# Patient Record
Sex: Male | Born: 1945
Health system: Southern US, Community
[De-identification: ages and names within clinical notes are randomized; demographics above are authoritative.]

## PROBLEM LIST (undated history)

## (undated) DIAGNOSIS — Z860100 Personal history of colon polyps, unspecified: Secondary | ICD-10-CM

## (undated) DIAGNOSIS — E785 Hyperlipidemia, unspecified: Secondary | ICD-10-CM

## (undated) DIAGNOSIS — E119 Type 2 diabetes mellitus without complications: Secondary | ICD-10-CM

## (undated) DIAGNOSIS — Z6372 Alcoholism and drug addiction in family: Secondary | ICD-10-CM

## (undated) DIAGNOSIS — N400 Enlarged prostate without lower urinary tract symptoms: Secondary | ICD-10-CM

## (undated) DIAGNOSIS — Z833 Family history of diabetes mellitus: Secondary | ICD-10-CM

## (undated) DIAGNOSIS — K5790 Diverticulosis of intestine, part unspecified, without perforation or abscess without bleeding: Secondary | ICD-10-CM

## (undated) HISTORY — DX: Benign prostatic hyperplasia without lower urinary tract symptoms: N40.0

## (undated) HISTORY — PX: OTHER SURGICAL HISTORY: SHX169

## (undated) HISTORY — DX: Personal history of colon polyps, unspecified: Z86.0100

## (undated) HISTORY — DX: Family history of diabetes mellitus: Z83.3

## (undated) HISTORY — PX: COLONOSCOPY W/ POLYPECTOMY: SHX1380

## (undated) HISTORY — DX: Hyperlipidemia, unspecified: E78.5

## (undated) HISTORY — DX: Personal history of colonic polyps: Z86.010

## (undated) HISTORY — DX: Diverticulosis of intestine, part unspecified, without perforation or abscess without bleeding: K57.90

## (undated) HISTORY — DX: Alcoholism and drug addiction in family: Z63.72

---

## 2005-02-02 ENCOUNTER — Ambulatory Visit: Payer: Self-pay | Admitting: Internal Medicine

## 2005-02-18 ENCOUNTER — Ambulatory Visit: Payer: Self-pay | Admitting: Internal Medicine

## 2006-12-18 ENCOUNTER — Emergency Department (HOSPITAL_COMMUNITY): Admission: EM | Admit: 2006-12-18 | Discharge: 2006-12-18 | Payer: Self-pay | Admitting: Internal Medicine

## 2008-11-16 ENCOUNTER — Ambulatory Visit: Payer: Self-pay | Admitting: Internal Medicine

## 2008-11-16 DIAGNOSIS — Z860100 Personal history of colon polyps, unspecified: Secondary | ICD-10-CM | POA: Insufficient documentation

## 2008-11-16 DIAGNOSIS — E785 Hyperlipidemia, unspecified: Secondary | ICD-10-CM

## 2008-11-16 DIAGNOSIS — N400 Enlarged prostate without lower urinary tract symptoms: Secondary | ICD-10-CM | POA: Insufficient documentation

## 2008-12-19 ENCOUNTER — Ambulatory Visit: Payer: Self-pay | Admitting: Internal Medicine

## 2009-01-01 ENCOUNTER — Encounter: Payer: Self-pay | Admitting: Internal Medicine

## 2009-01-03 ENCOUNTER — Ambulatory Visit: Payer: Self-pay

## 2009-01-03 ENCOUNTER — Ambulatory Visit: Payer: Self-pay | Admitting: Cardiology

## 2009-04-03 ENCOUNTER — Ambulatory Visit: Payer: Self-pay | Admitting: Internal Medicine

## 2009-12-26 ENCOUNTER — Encounter: Payer: Self-pay | Admitting: Internal Medicine

## 2009-12-31 ENCOUNTER — Ambulatory Visit: Payer: Self-pay | Admitting: Internal Medicine

## 2009-12-31 ENCOUNTER — Encounter: Payer: Self-pay | Admitting: Internal Medicine

## 2010-01-24 ENCOUNTER — Encounter (INDEPENDENT_AMBULATORY_CARE_PROVIDER_SITE_OTHER): Payer: Self-pay | Admitting: *Deleted

## 2010-01-27 ENCOUNTER — Ambulatory Visit: Payer: Self-pay | Admitting: Internal Medicine

## 2010-02-21 ENCOUNTER — Ambulatory Visit: Payer: Self-pay | Admitting: Internal Medicine

## 2010-02-21 LAB — HM COLONOSCOPY

## 2010-03-25 NOTE — Miscellaneous (Signed)
Summary: REC COL...AS.  Clinical Lists Changes  Medications: Added new medication of MOVIPREP 100 GM  SOLR (PEG-KCL-NACL-NASULF-NA ASC-C) As per prep instructions. - Signed Rx of MOVIPREP 100 GM  SOLR (PEG-KCL-NACL-NASULF-NA ASC-C) As per prep instructions.;  #1 x 0;  Signed;  Entered by: Clide Cliff RN;  Authorized by: Hilarie Fredrickson MD;  Method used: Electronically to [id number]*, [address], [place], [place], [place]  [address], Ph: [phone], Fax: [phone] Observations: Added new observation of ALLERGY REV: Done (01/27/2010 7:57)    Prescriptions: MOVIPREP 100 GM  SOLR (PEG-KCL-NACL-NASULF-NA ASC-C) As per prep instructions.  #1 x 0   Entered by:   Clide Cliff RN   Authorized by:   Hilarie Fredrickson MD   Signed by:   Clide Cliff RN on 01/27/2010   Method used:   Electronically to        [id number]* (retail)       [address]       [place], [place]  [address]       Ph: [phone]       Fax: [phone]   RxID:   [id number]

## 2010-03-25 NOTE — Letter (Signed)
Summary: [facility] Instructions  [facility]  [address] [place], [place] [address]   Phone: [phone]  Fax: [phone]       Nikolos LEWAN    1958/05/28    MRN: [id number]        Procedure Day Dorna Bloom:  Farrell Ours   02/21/10     Arrival Time:  10:00AM     Procedure Time:  11:00AM     Location of Procedure:                    Juliann Pares _  [facility] (4th Floor)                      PREPARATION FOR COLONOSCOPY WITH MOVIPREP   Starting 5 days prior to your procedure 02/16/10 do not eat nuts, seeds, popcorn, corn, beans, peas,  salads, or any raw vegetables.  Do not take any fiber supplements (e.g. Metamucil, Citrucel, and Benefiber).  THE DAY BEFORE YOUR PROCEDURE         DATE: 02/20/10  DAY: THURSDAY  1.  Drink clear liquids the entire day-NO SOLID FOOD  2.  Do not drink anything colored red or purple.  Avoid juices with pulp.  No orange juice.  3.  Drink at least 64 oz. (8 glasses) of fluid/clear liquids during the day to prevent dehydration and help the prep work efficiently.  CLEAR LIQUIDS INCLUDE: Water Jello Ice Popsicles Tea (sugar ok, no milk/cream) Powdered fruit flavored drinks Coffee (sugar ok, no milk/cream) Gatorade Juice: apple, white grape, white cranberry  Lemonade Clear bullion, consomm, broth Carbonated beverages (any kind) Strained chicken noodle soup Hard Candy                             4.  In the morning, mix first dose of MoviPrep solution:    Empty 1 Pouch A and 1 Pouch B into the disposable container    Add lukewarm drinking water to the top line of the container. Mix to dissolve    Refrigerate (mixed solution should be used within 24 hrs)  5.  Begin drinking the prep at 5:00 p.m. The MoviPrep container is divided by 4 marks.   Every 15 minutes drink the solution down to the next mark (approximately 8 oz) until the full liter is complete.   6.  Follow completed prep with 16 oz of clear liquid of your choice  (Nothing red or purple).  Continue to drink clear liquids until bedtime.  7.  Before going to bed, mix second dose of MoviPrep solution:    Empty 1 Pouch A and 1 Pouch B into the disposable container    Add lukewarm drinking water to the top line of the container. Mix to dissolve    Refrigerate  THE DAY OF YOUR PROCEDURE      DATE: 02/21/10  DAY: FRIDAY  Beginning at 6:00AM (5 hours before procedure):         1. Every 15 minutes, drink the solution down to the next mark (approx 8 oz) until the full liter is complete.  2. Follow completed prep with 16 oz. of clear liquid of your choice.    3. You may drink clear liquids until 9:00AM (2 HOURS BEFORE PROCEDURE).   MEDICATION INSTRUCTIONS  Unless otherwise instructed, you should take regular prescription medications with a small sip of water   as early as possible the morning of  your procedure.           OTHER INSTRUCTIONS  You will need a responsible adult at least 65 years of age to accompany you and drive you home.   This person must remain in the waiting room during your procedure.  Wear loose fitting clothing that is easily removed.  Leave jewelry and other valuables at home.  However, you may wish to bring a book to read or  an iPod/MP3 player to listen to music as you wait for your procedure to start.  Remove all body piercing jewelry and leave at home.  Total time from sign-in until discharge is approximately 2-3 hours.  You should go home directly after your procedure and rest.  You can resume normal activities the  day after your procedure.  The day of your procedure you should not:   Drive   Make legal decisions   Operate machinery   Drink alcohol   Return to work  You will receive specific instructions about eating, activities and medications before you leave.    The above instructions have been reviewed and explained to me by   Clide Cliff, RN______________________    I fully  understand and can verbalize these instructions _____________________________ Date _________

## 2010-03-25 NOTE — Letter (Signed)
Summary: Colonoscopy Letter  [facility]  [address] [place], [place] [address]   Phone: [phone]  Fax: [phone]      December 26, 2009 MRN: [id number]   Sigmond SAVANT [address] [place], [place]  [address]   Dear Mr. VI,   According to your medical record, it is time for you to schedule a Colonoscopy. The American Cancer Society recommends this procedure as a method to detect early colon cancer. Patients with a family history of colon cancer, or a personal history of colon polyps or inflammatory bowel disease are at increased risk.  This letter has been generated based on the recommendations made at the time of your procedure. If you feel that in your particular situation this may no longer apply, please contact our office.  Please call our office at [phone] to schedule this appointment or to update your records at your earliest convenience.  Thank you for cooperating with [place] to provide you with the very best care possible.   Sincerely,  Wilhemina Bonito. Marina Goodell, M.D.  [facility] [phone]

## 2010-03-25 NOTE — Assessment & Plan Note (Signed)
Summary: cpx-lb   Vital Signs:  Patient profile:   65 year old male Height:      69 inches Weight:      210.50 pounds BMI:     31.20 O2 Sat:      96 % on Room air Temp:     97.9 degrees F oral Pulse rate:   82 / minute Pulse rhythm:   regular Resp:     16 per minute BP sitting:   132 / 68  (left arm) Cuff size:   large  Vitals Entered By: Rock Nephew CMA (December 31, 2009 8:55 AM)  Nutrition Counseling: Patient's BMI is greater than 25 and therefore counseled on weight management options.  O2 Flow:  Room air CC: CPX, Preventive Care, Lipid Management Is Patient Diabetic? No Pain Assessment Patient in pain? no       Does patient need assistance? Functional Status Self care Ambulation Normal   Primary Care Provider:  Etta Grandchild MD  CC:  CPX, Preventive Care, and Lipid Management.  History of Present Illness: He returns for a physical with no complaints.  Lipid Management History:      Positive NCEP/ATP III risk factors include male age 70 years old or older.  Negative NCEP/ATP III risk factors include non-diabetic, no family history for ischemic heart disease, non-tobacco-user status, non-hypertensive, no ASHD (atherosclerotic heart disease), no prior stroke/TIA, no peripheral vascular disease, and no history of aortic aneurysm.        The patient states that he knows about the "Therapeutic Lifestyle Change" diet.  His compliance with the TLC diet is fair.  The patient expresses understanding of adjunctive measures for cholesterol lowering.  Adjunctive measures started by the patient include aerobic exercise, fiber, ASA, limit alcohol consumpton, and weight reduction.  He expresses no side effects from his lipid-lowering medication.  The patient denies any symptoms to suggest myopathy or liver disease.     Preventive Screening-Counseling & Management  Alcohol-Tobacco     Alcohol drinks/day: <1     Alcohol type: wine     >5/day in last 3 mos: no     Alcohol  Counseling: not indicated; use of alcohol is not excessive or problematic     Feels need to cut down: no     Feels annoyed by complaints: no     Feels guilty re: drinking: no     Needs 'eye opener' in am: no     Smoking Status: never     Tobacco Counseling: not indicated; no tobacco use  Hep-HIV-STD-Contraception     Hepatitis Risk: no risk noted     HIV Risk: no risk noted     STD Risk: no risk noted     Dental Visit-last 6 months yes     Dental Care Counseling: to seek dental care; no dental care within six months     TSE monthly: yes     Testicular SE Education/Counseling to perform regular STE     Sun Exposure-Excessive: no  Safety-Violence-Falls     Seat Belt Use: yes     Firearms in the Home: no firearms in the home     Smoke Detectors: no     Violence in the Home: no risk noted     Sexual Abuse: no      Sexual History:  currently monogamous.        Drug Use:  no.        Blood Transfusions:  no.    Clinical  Review Panels:  Prevention   Last Colonoscopy:  Adenomatous Polyp (11/03/2005)  Immunizations   Last Flu Vaccine:  Historical (12/04/2008)  Diabetes Management   Last Flu Vaccine:  Historical (12/04/2008)   Medications Prior to Update: 1)  Tamsulosin Hcl 0.4 Mg Caps (Tamsulosin Hcl) .... Take 1 Tablet By Mouth Once A Day 2)  Lipitor 40 Mg Tabs (Atorvastatin Calcium) .... Take 1 Tablet By Mouth Once A Day 3)  Garlic Tablets 4)  Vitamin E 600iu 5)  Natural Amino 1500 6)  B-50 Complex 7)  Abc Plus- Multivitamin 8)  Aspirin 325 Mg Tabs (Aspirin) 9)  Fish Oil 1200 Mg Caps (Omega-3 Fatty Acids) 10)  Flaxseed Oil 1200 Mg Caps (Flaxseed (Linseed)) 11)  Multi-Enzyme 12)  Borage Seed Oil  Current Medications (verified): 1)  Tamsulosin Hcl 0.4 Mg Caps (Tamsulosin Hcl) .... Take 1 Tablet By Mouth Once A Day 2)  Lipitor 40 Mg Tabs (Atorvastatin Calcium) .... Take 1 Tablet By Mouth Once A Day 3)  Garlic Tablets 4)  Vitamin E 600iu 5)  Natural Amino 1500 6)   B-50 Complex 7)  Abc Plus- Multivitamin 8)  Aspirin 325 Mg Tabs (Aspirin) 9)  Triple Omega-3-6-9  Caps (Omega 3-6-9 Fatty Acids) 10)  Flaxseed Oil 1200 Mg Caps (Flaxseed (Linseed)) 11)  Multi-Enzyme  Allergies (verified): 1)  ! * Thimerosal  Past History:  Past Medical History: Last updated: 01/01/2009 Current Problems:  FAMILY HISTORY OF ALCOHOLISM/ADDICTION (ICD-V61.41) BENIGN PROSTATIC HYPERTROPHY (ICD-600.00) HYPERLIPIDEMIA (ICD-272.4) COLONIC POLYPS, HX OF (ICD-V12.72) ROUTINE GENERAL MEDICAL EXAM@HEALTH  CARE FACL (ICD-V70.0) FAMILY HISTORY DIABETES 1ST DEGREE RELATIVE (ICD-V18.0)  Past Surgical History: Last updated: 11/16/2008 Denies surgical history  Family History: Last updated: 11/16/2008 Family History of Alcoholism/Addiction Family History Diabetes 1st degree relative  Social History: Last updated: 11/16/2008 Occupation: Technical brewer at [employer] Divorced Never Smoked Alcohol use-yes Drug use-no Regular exercise-yes  Risk Factors: Alcohol Use: <1 (12/31/2009) >5 drinks/d w/in last 3 months: no (12/31/2009) Exercise: yes (11/16/2008)  Risk Factors: Smoking Status: never (12/31/2009)  Family History: Reviewed history from 11/16/2008 and no changes required. Family History of Alcoholism/Addiction Family History Diabetes 1st degree relative  Social History: Reviewed history from 11/16/2008 and no changes required. 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  The patient denies anorexia, fever, chest pain, syncope, dyspnea on exertion, peripheral edema, prolonged cough, headaches, hemoptysis, abdominal pain, melena, hematochezia, severe indigestion/heartburn, hematuria, suspicious skin lesions, difficulty walking,  depression, enlarged lymph nodes, angioedema, and testicular masses.   CV:  Denies chest pain or discomfort, difficulty breathing at night, difficulty breathing while lying down, fainting, fatigue, leg cramps with exertion, lightheadness, palpitations, shortness of breath with exertion, and swelling of feet. GU:  Complains of urinary hesitancy; denies decreased libido, discharge, dysuria, erectile dysfunction, hematuria, incontinence, nocturia, and urinary frequency.  Physical Exam  General:  alert, well-developed, well-nourished, well-hydrated, appropriate dress, normal appearance, cooperative to examination, good hygiene, and overweight-appearing.   Head:  normocephalic, atraumatic, and no abnormalities observed.   Eyes:  vision grossly intact, pupils equal, pupils round, and pupils reactive to light.   Mouth:  good dentition, no gingival abnormalities, no dental plaque, pharynx pink and moist, no erythema, no exudates, no posterior lymphoid hypertrophy, and no lesions.   Neck:  supple, full ROM, no  masses, and no neck tenderness.   Breasts:  No masses or gynecomastia noted Lungs:  Normal respiratory effort, chest expands symmetrically. Lungs are clear to auscultation, no crackles or wheezes. Heart:  Normal rate and regular rhythm. S1 and S2 normal without gallop, murmur, click, rub or other extra sounds. Abdomen:  soft, non-tender, normal bowel sounds, no distention, no masses, no guarding, no rigidity, no rebound tenderness, no inguinal hernia, no hepatomegaly, and no splenomegaly.  he has a large ventral hernia.  Rectal:  No external abnormalities noted. Normal sphincter tone. No rectal masses or tenderness. Heme neg. stool. Genitalia:  circumcised, no hydrocele, no varicocele, no scrotal masses, no testicular masses or atrophy, no cutaneous lesions, and no urethral discharge.   Prostate:  Prostate gland firm and smooth, no enlargement, nodularity, tenderness, mass, asymmetry or  induration. Msk:  normal ROM, no joint tenderness, no joint swelling, and no joint warmth.   Pulses:  R and L carotid,radial,femoral,dorsalis pedis and posterior tibial pulses are full and equal bilaterally Extremities:  trace left pedal edema and trace right pedal edema.   Neurologic:  No cranial nerve deficits noted. Station and gait are normal. Plantar reflexes are down-going bilaterally. DTRs are symmetrical throughout. Sensory, motor and coordinative functions appear intact. Skin:  turgor normal, color normal, no rashes, no suspicious lesions, no ecchymoses, no petechiae, no purpura, no ulcerations, no edema, solar damage, and seborrheic keratosis.   Cervical Nodes:  No lymphadenopathy noted Axillary Nodes:  No palpable lymphadenopathy Inguinal Nodes:  No significant adenopathy Psych:  Cognition and judgment appear intact. Alert and cooperative with normal attention span and concentration. No apparent delusions, illusions, hallucinations   Impression & Recommendations:  Problem # 1:  ROUTINE GENERAL MEDICAL EXAM@HEALTH  CARE FACL (ICD-V70.0) Assessment Unchanged  Orders: Venipuncture ([address]) TLB-Lipid Panel (80061-LIPID) TLB-BMP (Basic Metabolic Panel-BMET) (80048-METABOL) TLB-CBC Platelet - w/Differential (85025-CBCD) TLB-Hepatic/Liver Function Pnl (80076-HEPATIC) TLB-TSH (Thyroid Stimulating Hormone) (84443-TSH) TLB-Udip w/ Micro (81001-URINE) TLB-PSA (Prostate Specific Antigen) (84153-PSA) Hemoccult Guaiac-1 spec.(in office) (82270) EKG w/ Interpretation ([address]000)  Colonoscopy: Adenomatous Polyp (11/03/2005) Flu Vax: Historical (12/04/2008)    Discussed using sunscreen, use of alcohol, drug use, self testicular exam, routine dental care, routine eye care, routine physical exam, seat belts, multiple vitamins, and recommendations for immunizations.  Discussed exercise and checking cholesterol.  Also recommend checking PSA.  Complete Medication List: 1)  Tamsulosin Hcl 0.4 Mg Caps  (Tamsulosin hcl) .... Take 1 tablet by mouth once a day 2)  Lipitor 40 Mg Tabs (Atorvastatin calcium) .... Take 1 tablet by mouth once a day 3)  Garlic Tablets  4)  Vitamin E 600iu  5)  Natural Amino 1500  6)  B-50 Complex  7)  Abc Plus- Multivitamin  8)  Aspirin 325 Mg Tabs (Aspirin) 9)  Triple Omega-3-6-9 Caps (Omega 3-6-9 fatty acids) 10)  Flaxseed Oil 1200 Mg Caps (Flaxseed (linseed)) 11)  Multi-enzyme   Lipid Assessment/Plan:      Based on NCEP/ATP III, the patient's risk factor category is "0-1 risk factors".  The patient's lipid goals are as follows: Total cholesterol goal is 200; LDL cholesterol goal is 160; HDL cholesterol goal is 40; Triglyceride goal is 150.    Colorectal Screening:  Current Recommendations:    Hemoccult: NEG X 1 today    Colonoscopy recommended: scheduled with G.I.  PSA Screening:    Reviewed PSA screening recommendations: PSA ordered  Immunization & Chemoprophylaxis:    Influenza vaccine: Historical  (12/04/2008)  Patient Instructions: 1)  Please schedule a follow-up appointment in  1 year. 2)  It is important that you exercise regularly at least 20 minutes 5 times a week. If you develop chest pain, have severe difficulty breathing, or feel very tired , stop exercising immediately and seek medical attention. 3)  You need to lose weight. Consider a lower calorie diet and regular exercise.  Prescriptions: LIPITOR 40 MG TABS (ATORVASTATIN CALCIUM) Take 1 tablet by mouth once a day  #90 x 3   Entered and Authorized by:   Etta Grandchild MD   Signed by:   Etta Grandchild MD on 12/31/2009   Method used:   Printed then faxed to ...       [id number]* (retail)       [address]       [place], [place]  [address]       Ph: [phone]       Fax: [phone]   RxID:   [id number] TAMSULOSIN HCL 0.4 MG CAPS (TAMSULOSIN HCL) Take 1 tablet by mouth once a day  #90 x 3   Entered and Authorized by:    Etta Grandchild MD   Signed by:   Etta Grandchild MD on 12/31/2009   Method used:   Printed then faxed to ...       [facility] [phone]* (retail)       [address]       [place], [place]  [address]       Ph: [phone]       Fax: [phone]   RxID:   [id number]    Orders Added: 1)  Venipuncture [93235] 2)  TLB-Lipid Panel [80061-LIPID] 3)  TLB-BMP (Basic Metabolic Panel-BMET) [80048-METABOL] 4)  TLB-CBC Platelet - w/Differential [85025-CBCD] 5)  TLB-Hepatic/Liver Function Pnl [80076-HEPATIC] 6)  TLB-TSH (Thyroid Stimulating Hormone) [84443-TSH] 7)  TLB-Udip w/ Micro [81001-URINE] 8)  TLB-PSA (Prostate Specific Antigen) [84153-PSA] 9)  Est. Patient 40-64 years [[address]396] 10)  Hemoccult Guaiac-1 spec.(in office) [82270] 11)  EKG w/ Interpretation [93000]  Appended Document: cpx[facility]lb [facility] [phone]

## 2010-03-25 NOTE — Letter (Signed)
Summary: Lipid Letter  [facility]  [address] [place], [place] [address]   Phone: [phone]  Fax: [phone]    04/03/2009  Taylor Torres [address] [place], [place]  [address]  Dear Taylor Torres:  We have carefully reviewed your last lipid profile from 01/01/09 and the results are noted below with a summary of recommendations for lipid management.    Cholesterol:       140     Goal: <200   HDL "good" Cholesterol:   37     Goal: >40   LDL "bad" Cholesterol:   78     Goal: <160   Triglycerides:       124     Goal: <150    these are good results, though the HDL could be higher    TLC Diet (Therapeutic Lifestyle Change): Saturated Fats & Transfatty acids should be kept < 7% of total calories ***Reduce Saturated Fats Polyunstaurated Fat can be up to 10% of total calories Monounsaturated Fat Fat can be up to 20% of total calories Total Fat should be no greater than 25-35% of total calories Carbohydrates should be 50-60% of total calories Protein should be approximately 15% of total calories Fiber should be at least 20-30 grams a day ***Increased fiber may help lower LDL Total Cholesterol should be < 200mg /day Consider adding plant stanol/sterols to diet (example: Benacol spread) ***A higher intake of unsaturated fat may reduce Triglycerides and Increase HDL    Adjunctive Measures (may lower LIPIDS and reduce risk of Heart Attack) include: Aerobic Exercise (20-30 minutes 3-4 times a week) Limit Alcohol Consumption Weight Reduction Aspirin 75-81 mg a day by mouth (if not allergic or contraindicated) Dietary Fiber 20-30 grams a day by mouth     Current Medications: 1)    Tamsulosin Hcl 0.4 Mg Caps (Tamsulosin hcl) .... Take 1 tablet by mouth once a day 2)    Lipitor 40 Mg Tabs (Atorvastatin calcium) .... Take 1 tablet by mouth once a day 3)    Garlic Tablets  4)    Vitamin E 600iu  5)    Natural Amino 1500  6)    B-50 Complex  7)    Abc Plus- Multivitamin   8)    Aspirin 325 Mg Tabs (Aspirin) 9)    Fish Oil 1200 Mg Caps (Omega-3 fatty acids) 10)    Flaxseed Oil 1200 Mg Caps (Flaxseed (linseed)) 11)    Multi-enzyme  12)    Borage Seed Oil   If you have any questions, please call. We appreciate being able to work with you.   Sincerely,    [facility] Etta Grandchild MD

## 2010-03-25 NOTE — Letter (Signed)
Summary: Results Follow-up Letter  [facility]  [address] [place], [place] [address]   Phone: [phone]  Fax: [phone]    04/03/2009  [address] [place], [place]  [address]  Dear Mr. PICKREL,   The following are the results of your  test(s) done 01/01/09:  Test     Result     CBC       normal Liver/kidney   normal Thyroid     normal Prostate     normal    _________________________________________________________  Please call for an appointment as directed _________________________________________________________ _________________________________________________________ _________________________________________________________  Sincerely,  Sanda Linger MD [facility]

## 2010-03-25 NOTE — Assessment & Plan Note (Signed)
Summary: 4 mos f/u #/xcd   Vital Signs:  Patient profile:   65 year old male Height:      69 inches Weight:      209 pounds BMI:     30.98 O2 Sat:      97 % on Room air Temp:     98.2 degrees F oral Pulse rate:   85 / minute Pulse rhythm:   regular Resp:     16 per minute BP sitting:   136 / 70  (left arm) Cuff size:   large  Vitals Entered By: Rock Nephew CMA (April 03, 2009 8:39 AM)  Nutrition Counseling: Patient's BMI is greater than 25 and therefore counseled on weight management options.  O2 Flow:  Room air  Primary Care Provider:  Etta Grandchild MD   History of Present Illness: He returns for f/up and it became apparent today that I did not receive a copy of his recent labs that were done at [facility]. He feels well and offers no complaints.  Lipid Management History:      Positive NCEP/ATP III risk factors include male age 1 years old or older.  Negative NCEP/ATP III risk factors include non-diabetic, no family history for ischemic heart disease, non-tobacco-user status, non-hypertensive, no ASHD (atherosclerotic heart disease), no prior stroke/TIA, no peripheral vascular disease, and no history of aortic aneurysm.        The patient states that he knows about the "Therapeutic Lifestyle Change" diet.  His compliance with the TLC diet is fair.  The patient expresses understanding of adjunctive measures for cholesterol lowering.  Adjunctive measures started by the patient include fiber, limit alcohol consumpton, and weight reduction.  He expresses no side effects from his lipid-lowering medication.  The patient denies any symptoms to suggest myopathy or liver disease.     Preventive Screening-Counseling & Management  Alcohol-Tobacco     Alcohol drinks/day: <1     Alcohol type: wine     >5/day in last 3 mos: no     Alcohol Counseling: not indicated; use of alcohol is not excessive or problematic     Feels need to cut down: no     Feels annoyed by complaints: no   Feels guilty re: drinking: no     Needs 'eye opener' in am: no     Smoking Status: never  Hep-HIV-STD-Contraception     Hepatitis Risk: no risk noted     HIV Risk: no risk noted     STD Risk: no risk noted     TSE monthly: yes      Drug Use:  no.        Blood Transfusions:  no.    Current Medications (verified): 1)  Tamsulosin Hcl 0.4 Mg Caps (Tamsulosin Hcl) .... Take 1 Tablet By Mouth Once A Day 2)  Lipitor 40 Mg Tabs (Atorvastatin Calcium) .... Take 1 Tablet By Mouth Once A Day 3)  Garlic Tablets 4)  Vitamin E 600iu 5)  Natural Amino 1500 6)  B-50 Complex 7)  Abc Plus- Multivitamin 8)  Aspirin 325 Mg Tabs (Aspirin) 9)  Fish Oil 1200 Mg Caps (Omega-3 Fatty Acids) 10)  Flaxseed Oil 1200 Mg Caps (Flaxseed (Linseed)) 11)  Multi-Enzyme 12)  Borage Seed Oil  Allergies (verified): 1)  ! * Thimerosal  Past History:  Past Medical History: Reviewed history from 01/01/2009 and no changes required. Current Problems:  FAMILY HISTORY OF ALCOHOLISM/ADDICTION (ICD-V61.41) BENIGN PROSTATIC HYPERTROPHY (ICD-600.00) HYPERLIPIDEMIA (ICD-272.4) COLONIC POLYPS, HX OF (  ICD-V12.72) ROUTINE GENERAL MEDICAL EXAM@HEALTH  CARE FACL (ICD-V70.0) FAMILY HISTORY DIABETES 1ST DEGREE RELATIVE (ICD-V18.0)  Past Surgical History: Reviewed history from 11/16/2008 and no changes required. Denies surgical history  Family History: Reviewed history from 11/16/2008 and no changes required. Family History of Alcoholism/Addiction Family History Diabetes 1st degree relative  Social History: Reviewed history from 11/16/2008 and no changes required. Occupation: Technical brewer at [employer] Never Smoked Alcohol use-yes Drug use-no Regular exercise-yes  Review of Systems       The patient complains of weight gain.  The patient denies anorexia, chest pain, syncope, dyspnea on exertion, peripheral edema, prolonged cough, headaches, hemoptysis, abdominal pain, hematuria, difficulty walking, and  depression.    Physical Exam  General:  alert, well-developed, well-nourished, well-hydrated, appropriate dress, normal appearance, cooperative to examination, good hygiene, and overweight-appearing.   Mouth:  good dentition, no gingival abnormalities, no dental plaque, pharynx pink and moist, no erythema, no exudates, no posterior lymphoid hypertrophy, and no lesions.   Neck:  supple, full ROM, no masses, and no neck tenderness.   Lungs:  Normal respiratory effort, chest expands symmetrically. Lungs are clear to auscultation, no crackles or wheezes. Heart:  Normal rate and regular rhythm. S1 and S2 normal without gallop, murmur, click, rub or other extra sounds. Abdomen:  soft, non-tender, normal bowel sounds, no distention, no masses, no guarding, no rigidity, no rebound tenderness, no inguinal hernia, no hepatomegaly, and no splenomegaly.  he has a large ventral hernia.  Msk:  normal ROM, no joint tenderness, no joint swelling, and no joint warmth.   Pulses:  R and L carotid,radial,femoral,dorsalis pedis and posterior tibial pulses are full and equal bilaterally Extremities:  trace left pedal edema and trace right pedal edema.   Neurologic:  No cranial nerve deficits noted. Station and gait are normal. Plantar reflexes are down-going bilaterally. DTRs are symmetrical throughout. Sensory, motor and coordinative functions appear intact. Skin:  turgor normal, color normal, no rashes, no suspicious lesions, no ecchymoses, no petechiae, no purpura, no ulcerations, no edema, solar damage, and seborrheic keratosis.   Psych:  Cognition and judgment appear intact. Alert and cooperative with normal attention span and concentration. No apparent delusions, illusions, hallucinations   Impression & Recommendations:  Problem # 1:  HYPERLIPIDEMIA (ICD-272.4) Assessment Unchanged  he will prompt [facility] to forward a copy of his labs to me from last October and will repeat lipids today His updated  medication list for this problem includes:    Lipitor 40 Mg Tabs (Atorvastatin calcium) .Marland Kitchen... Take 1 tablet by mouth once a day  Orders: Venipuncture (18841) TLB-Lipid Panel (80061-LIPID) TLB-BMP (Basic Metabolic Panel-BMET) (80048-METABOL) TLB-Hepatic/Liver Function Pnl (80076-HEPATIC) TLB-TSH (Thyroid Stimulating Hormone) (84443-TSH)  Lipid Goals: Chol Goal: 200 (11/16/2008)   HDL Goal: 40 (11/16/2008)   LDL Goal: 160 (11/16/2008)   TG Goal: 150 (11/16/2008)  Prior 10 Yr Risk Heart Disease: Not enough information (11/16/2008)  Complete Medication List: 1)  Tamsulosin Hcl 0.4 Mg Caps (Tamsulosin hcl) .... Take 1 tablet by mouth once a day 2)  Lipitor 40 Mg Tabs (Atorvastatin calcium) .... Take 1 tablet by mouth once a day 3)  Garlic Tablets  4)  Vitamin E 600iu  5)  Natural Amino 1500  6)  B-50 Complex  7)  Abc Plus- Multivitamin  8)  Aspirin 325 Mg Tabs (Aspirin) 9)  Fish Oil 1200 Mg Caps (Omega-3 fatty acids) 10)  Flaxseed Oil 1200 Mg Caps (Flaxseed (linseed)) 11)  Multi-enzyme  12)  Borage Seed Oil  Lipid Assessment/Plan:      Based on NCEP/ATP III, the patient's risk factor category is "0-1 risk factors".  The patient's lipid goals are as follows: Total cholesterol goal is 200; LDL cholesterol goal is 160; HDL cholesterol goal is 40; Triglyceride goal is 150.    Patient Instructions: 1)  Please schedule a follow-up appointment in 4 months. 2)  It is important that you exercise regularly at least 20 minutes 5 times a week. If you develop chest pain, have severe difficulty breathing, or feel very tired , stop exercising immediately and seek medical attention. 3)  You need to lose weight. Consider a lower calorie diet and regular exercise.

## 2010-03-27 NOTE — Procedures (Signed)
Summary: Colonoscopy  Patient: [name] Note: All result statuses are Final unless otherwise noted.  Tests: (1) Colonoscopy (COL)   COL Colonoscopy           DONE     [facility]     [address]     [place], [place]  [address]           COLONOSCOPY PROCEDURE REPORT           PATIENT:  Taylor Torres, Taylor Torres  MR#:  [id number]     BIRTHDATE:  Jul 06, 1945, 64 yrs. old  GENDER:  male     ENDOSCOPIST:  Wilhemina Bonito. Eda Keys, MD     REF. BY:  Surveillance Program Recall,     PROCEDURE DATE:  02/21/2010     PROCEDURE:  Surveillance Colonoscopy     ASA CLASS:  Class II     INDICATIONS:  history of pre-cancerous (adenomatous) colon polyps,     surveillance and high-risk screening ; index 2003 w/ TA; f/u 2006           MEDICATIONS:   Fentanyl 50 mcg IV, Versed 6 mg IV           DESCRIPTION OF PROCEDURE:   After the risks benefits and     alternatives of the procedure were thoroughly explained, informed     consent was obtained.  Digital rectal exam was performed and     revealed no abnormalities.   The LB CF-[id number] endoscope     was introduced through the anus and advanced to the cecum, which     was identified by both the appendix and ileocecal valve, without     limitations.Time to cecum = 3:00 min. The quality of the prep was     excellent, using MoviPrep.  The instrument was then slowly     withdrawn (time = 11:23 min) as the colon was fully examined.     <<PROCEDUREIMAGES>>           FINDINGS:  Severe diverticulosis was found in the left colon.     Otherwise normal colonoscopy without  polyps, masses, vascular     ectasias, or inflammatory changes.     Retroflexed views in the     rectum revealed no abnormalities.    The scope was then withdrawn     from the patient and the procedure completed.           COMPLICATIONS:  None     ENDOSCOPIC IMPRESSION:     1) Severe diverticulosis in the left colon     2) Otherwise normal colonoscopy     3) No polyps or cancers          RECOMMENDATIONS:     1) Follow up colonoscopy in 5 years           ______________________________     Wilhemina Bonito. Eda Keys, MD           CC:  Etta Grandchild, MD; The Patient           n.     eSIGNED:   Wilhemina Bonito. Eda Keys at 02/21/2010 12:26 PM           [place], [id number]  Note: An exclamation mark (!) indicates a result that was not dispersed into the flowsheet. Document Creation Date: 02/21/2010 12:26 PM _______________________________________________________________________  (1) Order result status: Final Collection or observation date-time: 02/21/2010 12:20 Requested date-time:  Receipt date-time:  Reported date-time:  Referring  Physician:   Ordering Physician: Fransico Setters [phone]) Specimen Source:  Source: Launa Grill Order Number: [id number] Lab site:   Appended Document: Colonoscopy    Clinical Lists Changes  Observations: Added new observation of COLONNXTDUE: 01/2015 (02/21/2010 12:35)

## 2010-05-13 ENCOUNTER — Telehealth: Payer: Self-pay | Admitting: Internal Medicine

## 2010-05-13 MED ORDER — HYDROCORTISONE ACETATE 25 MG RE SUPP: RECTAL | Status: DC

## 2010-05-13 NOTE — Telephone Encounter (Signed)
Patient aware of Dr. Marzetta Board recommendations. Prescription sent in for pt to [facility]. Pt instructed to call us back if no improvement.

## 2010-05-13 NOTE — Telephone Encounter (Signed)
FYI for Dr. Marina Goodell.

## 2010-05-13 NOTE — Telephone Encounter (Signed)
Spoke with pt and he states that he is having some rectal bleeding. He has seen bright red blood on the tissue paper when he wipes. Thinks he has an internal hemorrhoid. Had a colon with Dr. Marina Goodell in December. Dr. Arlyce Dice as doc of the day please advise.

## 2010-05-13 NOTE — Telephone Encounter (Signed)
Anusol hc supp 1 phs for 7 days will go back at this

## 2011-01-12 ENCOUNTER — Ambulatory Visit (INDEPENDENT_AMBULATORY_CARE_PROVIDER_SITE_OTHER): Payer: 59 | Admitting: Internal Medicine

## 2011-01-12 ENCOUNTER — Encounter: Payer: Self-pay | Admitting: Internal Medicine

## 2011-01-12 VITALS — BP 130/72 | HR 88 | Temp 98.5°F | Resp 16 | Ht 69.0 in | Wt 206.0 lb

## 2011-01-12 DIAGNOSIS — Z23 Encounter for immunization: Secondary | ICD-10-CM

## 2011-01-12 DIAGNOSIS — Z136 Encounter for screening for cardiovascular disorders: Secondary | ICD-10-CM

## 2011-01-12 DIAGNOSIS — Z0000 Encounter for general adult medical examination without abnormal findings: Secondary | ICD-10-CM

## 2011-01-12 NOTE — Progress Notes (Signed)
  Subjective:    Patient ID: Taylor Torres, male    DOB: 1945-04-15, 65 y.o.   MRN: [id number]  HPI He returns for a complete physical and he offers no complaints.    Review of Systems  Constitutional: Negative.   HENT: Negative.   Eyes: Negative.   Respiratory: Negative.   Cardiovascular: Negative.   Gastrointestinal: Negative.   Genitourinary: Negative.   Musculoskeletal: Negative.   Skin: Negative.   Neurological: Negative.   Hematological: Negative.   Psychiatric/Behavioral: Negative.        Objective:   Physical Exam  Vitals reviewed. Constitutional: He is oriented to person, place, and time. He appears well-developed and well-nourished. No distress.  HENT:  Head: Normocephalic and atraumatic.  Mouth/Throat: No oropharyngeal exudate.  Eyes: Conjunctivae are normal. Right eye exhibits no discharge. Left eye exhibits no discharge. No scleral icterus.  Neck: Normal range of motion. Neck supple. No JVD present. No tracheal deviation present. No thyromegaly present.  Cardiovascular: Normal rate, regular rhythm, normal heart sounds and intact distal pulses.  Exam reveals no gallop and no friction rub.   No murmur heard. Pulmonary/Chest: Effort normal and breath sounds normal. No stridor. No respiratory distress. He has no wheezes. He has no rales. He exhibits no tenderness.  Abdominal: Soft. Bowel sounds are normal. He exhibits no distension and no mass. There is no tenderness. There is no rebound and no guarding. Hernia confirmed negative in the right inguinal area and confirmed negative in the left inguinal area.  Genitourinary: Rectum normal, prostate normal, testes normal and penis normal. Rectal exam shows no external hemorrhoid, no internal hemorrhoid, no fissure, no mass, no tenderness and anal tone normal. Guaiac negative stool. Prostate is not enlarged and not tender. Right testis shows no mass, no swelling and no tenderness. Right testis is descended. Left testis shows no  mass, no swelling and no tenderness. Left testis is descended. Circumcised. No penile tenderness. No discharge found.  Musculoskeletal: Normal range of motion. He exhibits no edema and no tenderness.  Lymphadenopathy:    He has no cervical adenopathy.       Right: No inguinal adenopathy present.       Left: No inguinal adenopathy present.  Neurological: He is oriented to person, place, and time.  Skin: Skin is warm and dry. No rash noted. He is not diaphoretic. No erythema. No pallor.  Psychiatric: He has a normal mood and affect. His behavior is normal. Judgment and thought content normal.          Assessment & Plan:

## 2011-01-12 NOTE — Assessment & Plan Note (Addendum)
Exam done, labs ordered, vaccines were updated and pt ed material was given.  The patient is here for annual Medicare wellness examination and management of other chronic and acute problems.   The risk factors are reflected in the social history.  The roster of all physicians providing medical care to patient - is listed in the Snapshot section of the chart.  Activities of daily living:  The patient is 100% inedpendent in all ADLs: dressing, toileting, feeding as well as independent mobility  Home safety : The patient has smoke detectors in the home. They wear seatbelts.No firearms at home ( firearms are present in the home, kept in a safe fashion). There is no violence in the home.   There is no risks for hepatitis, STDs or HIV. There is no   history of blood transfusion. They have no travel history to infectious disease endemic areas of the world.  The patient has (has not) seen their dentist in the last six month. They have (not) seen their eye doctor in the last year. They deny (admit to) any hearing difficulty and have not had audiologic testing in the last year.  They do not  have excessive sun exposure. Discussed the need for sun protection: hats, long sleeves and use of sunscreen if there is significant sun exposure.   Diet: the importance of a healthy diet is discussed. They do have a healthy (unhealthy-high fat/fast food) diet.  The patient has a regular exercise program: _______ , ____duration, _____per week.  The benefits of regular aerobic exercise were discussed.  Depression screen: there are no signs or vegative symptoms of depression- irritability, change in appetite, anhedonia, sadness/tearfullness.  Cognitive assessment: the patient manages all their financial and personal affairs and is actively engaged. They could relate day,date,year and events; recalled 3/3 objects at 3 minutes; performed clock-face test normally.  The following portions of the patient's history were  reviewed and updated as appropriate: allergies, current medications, past family history, past medical history,  past surgical history, past social history  and problem list.  Vision, hearing, body mass index were assessed and reviewed.   During the course of the visit the patient was educated and counseled about appropriate screening and preventive services including : fall prevention , diabetes screening, nutrition counseling, colorectal cancer screening, and recommended immunizations.

## 2011-01-12 NOTE — Patient Instructions (Signed)
Health Maintenance, Males A healthy lifestyle and preventative care can promote health and wellness.  Maintain regular health, dental, and eye exams.   Eat a healthy diet. Foods like vegetables, fruits, whole grains, low-fat dairy products, and lean protein foods contain the nutrients you need without too many calories. Decrease your intake of foods high in solid fats, added sugars, and salt. Get information about a proper diet from your caregiver, if necessary.   Regular physical exercise is one of the most important things you can do for your health. Most adults should get at least 150 minutes of moderate-intensity exercise (any activity that increases your heart rate and causes you to sweat) each week. In addition, most adults need muscle-strengthening exercises on 2 or more days a week.    Maintain a healthy weight. The body mass index (BMI) is a screening tool to identify possible weight problems. It provides an estimate of body fat based on height and weight. Your caregiver can help determine your BMI, and can help you achieve or maintain a healthy weight. For adults 20 years and older:   A BMI below 18.5 is considered underweight.   A BMI of 18.5 to 24.9 is normal.   A BMI of 25 to 29.9 is considered overweight.   A BMI of 30 and above is considered obese.   Maintain normal blood lipids and cholesterol by exercising and minimizing your intake of saturated fat. Eat a balanced diet with plenty of fruits and vegetables. Blood tests for lipids and cholesterol should begin at age 20 and be repeated every 5 years. If your lipid or cholesterol levels are high, you are over 50, or you are a high risk for heart disease, you may need your cholesterol levels checked more frequently.Ongoing high lipid and cholesterol levels should be treated with medicines, if diet and exercise are not effective.   If you smoke, find out from your caregiver how to quit. If you do not use tobacco, do not start.    If you choose to drink alcohol, do not exceed 2 drinks per day. One drink is considered to be 12 ounces (355 mL) of beer, 5 ounces (148 mL) of wine, or 1.5 ounces (44 mL) of liquor.   Avoid use of street drugs. Do not share needles with anyone. Ask for help if you need support or instructions about stopping the use of drugs.   High blood pressure causes heart disease and increases the risk of stroke. Blood pressure should be checked at least every 1 to 2 years. Ongoing high blood pressure should be treated with medicines if weight loss and exercise are not effective.   If you are 45 to 65 years old, ask your caregiver if you should take aspirin to prevent heart disease.   Diabetes screening involves taking a blood sample to check your fasting blood sugar level. This should be done once every 3 years, after age 45, if you are within normal weight and without risk factors for diabetes. Testing should be considered at a younger age or be carried out more frequently if you are overweight and have at least 1 risk factor for diabetes.   Colorectal cancer can be detected and often prevented. Most routine colorectal cancer screening begins at the age of 50 and continues through age 75. However, your caregiver may recommend screening at an earlier age if you have risk factors for colon cancer. On a yearly basis, your caregiver may provide home test kits to check for hidden   blood in the stool. Use of a small camera at the end of a tube, to directly examine the colon (sigmoidoscopy or colonoscopy), can detect the earliest forms of colorectal cancer. Talk to your caregiver about this at age 50, when routine screening begins. Direct examination of the colon should be repeated every 5 to 10 years through age 75, unless early forms of pre-cancerous polyps or small growths are found.   Healthy men should no longer receive prostate-specific antigen (PSA) blood tests as part of routine cancer screening. Consult with  your caregiver about prostate cancer screening.   Practice safe sex. Use condoms and avoid high-risk sexual practices to reduce the spread of sexually transmitted infections (STIs).   Use sunscreen with a sun protection factor (SPF) of 30 or greater. Apply sunscreen liberally and repeatedly throughout the day. You should seek shade when your shadow is shorter than you. Protect yourself by wearing long sleeves, pants, a wide-brimmed hat, and sunglasses year round, whenever you are outdoors.   Notify your caregiver of new moles or changes in moles, especially if there is a change in shape or color. Also notify your caregiver if a mole is larger than the size of a pencil eraser.   A one-time screening for abdominal aortic aneurysm (AAA) and surgical repair of large AAAs by sound wave imaging (ultrasonography) is recommended for ages 65 to 75 years who are current or former smokers.   Stay current with your immunizations.  Document Released: 08/08/2007 Document Revised: 10/22/2010 Document Reviewed: 07/07/2010 [employer] Patient Information 2012 [employer]. 

## 2011-01-14 ENCOUNTER — Telehealth: Payer: Self-pay | Admitting: Internal Medicine

## 2011-01-14 NOTE — Telephone Encounter (Signed)
Patient called and stated that he was seen on Monday and forgot to ask for a year's Rx on his Lipitor and Flomax because he has to submit it to a new pharmacy program.  Please advise.

## 2011-01-14 NOTE — Telephone Encounter (Signed)
ok 

## 2011-01-21 ENCOUNTER — Other Ambulatory Visit: Payer: Self-pay | Admitting: *Deleted

## 2011-01-21 MED ORDER — ATORVASTATIN CALCIUM 40 MG PO TABS: 40.0000 mg | ORAL_TABLET | Freq: Every day | ORAL | Status: DC

## 2011-01-21 MED ORDER — TAMSULOSIN HCL 0.4 MG PO CAPS: 0.4000 mg | ORAL_CAPSULE | Freq: Every day | ORAL | Status: DC

## 2011-01-21 NOTE — Telephone Encounter (Signed)
Rx[s] printed, signed & placed in mail.

## 2011-01-21 NOTE — Telephone Encounter (Signed)
Mailed Rx's to patient.  Patient informed by VM.

## 2012-01-13 ENCOUNTER — Encounter: Payer: Self-pay | Admitting: Internal Medicine

## 2012-01-13 ENCOUNTER — Ambulatory Visit (INDEPENDENT_AMBULATORY_CARE_PROVIDER_SITE_OTHER): Payer: 59 | Admitting: Internal Medicine

## 2012-01-13 VITALS — BP 120/68 | HR 78 | Temp 97.5°F | Resp 16 | Wt 207.0 lb

## 2012-01-13 DIAGNOSIS — Z23 Encounter for immunization: Secondary | ICD-10-CM

## 2012-01-13 DIAGNOSIS — Z0000 Encounter for general adult medical examination without abnormal findings: Secondary | ICD-10-CM

## 2012-01-13 DIAGNOSIS — E785 Hyperlipidemia, unspecified: Secondary | ICD-10-CM

## 2012-01-13 DIAGNOSIS — N410 Acute prostatitis: Secondary | ICD-10-CM

## 2012-01-13 LAB — FECAL OCCULT BLOOD, GUAIAC: Fecal Occult Blood: NEGATIVE

## 2012-01-13 MED ORDER — ATORVASTATIN CALCIUM 40 MG PO TABS: 40.0000 mg | ORAL_TABLET | Freq: Every day | ORAL | Status: DC

## 2012-01-13 NOTE — Progress Notes (Signed)
  Subjective:    Patient ID: Taylor Torres, male    DOB: 1945/03/21, 66 y.o.   MRN: [id number]  HPI  He returns for a complete physical - he feels well and offers no complaints.  Review of Systems  Constitutional: Negative.   HENT: Negative.   Eyes: Negative.   Respiratory: Negative.   Cardiovascular: Negative.   Gastrointestinal: Negative.   Genitourinary: Negative.   Musculoskeletal: Negative.   Skin: Negative.   Neurological: Negative.   Hematological: Negative.   Psychiatric/Behavioral: Negative.        Objective:   Physical Exam  Vitals reviewed. Constitutional: He is oriented to person, place, and time. He appears well-developed and well-nourished. No distress.  HENT:  Head: Normocephalic and atraumatic.  Mouth/Throat: Oropharynx is clear and moist. No oropharyngeal exudate.  Eyes: Conjunctivae normal are normal. Right eye exhibits no discharge. Left eye exhibits no discharge. No scleral icterus.  Neck: Normal range of motion. Neck supple. No JVD present. No tracheal deviation present. No thyromegaly present.  Cardiovascular: Normal rate, regular rhythm, normal heart sounds and intact distal pulses.  Exam reveals no gallop and no friction rub.   No murmur heard. Pulmonary/Chest: Effort normal and breath sounds normal. No stridor. No respiratory distress. He has no wheezes. He has no rales. He exhibits no tenderness.  Abdominal: Soft. Bowel sounds are normal. He exhibits no distension and no mass. There is no tenderness. There is no rebound and no guarding. Hernia confirmed negative in the right inguinal area and confirmed negative in the left inguinal area.  Genitourinary: Rectum normal, prostate normal, testes normal and penis normal. Rectal exam shows no external hemorrhoid, no internal hemorrhoid, no fissure, no mass, no tenderness and anal tone normal. Guaiac negative stool. Prostate is not enlarged and not tender. Right testis shows no mass, no swelling and no  tenderness. Right testis is descended. Left testis shows no mass, no swelling and no tenderness. Left testis is descended. Circumcised. No penile erythema or penile tenderness. No discharge found.  Musculoskeletal: Normal range of motion. He exhibits no edema and no tenderness.  Lymphadenopathy:    He has no cervical adenopathy.       Right: No inguinal adenopathy present.       Left: No inguinal adenopathy present.  Neurological: He is oriented to person, place, and time.  Skin: Skin is warm and dry. No rash noted. He is not diaphoretic. No erythema. No pallor.  Psychiatric: His behavior is normal. Judgment and thought content normal.      No results found for this basename: WBC, HGB, HCT, PLT, GLUCOSE, CHOL, TRIG, HDL, LDLDIRECT, LDLCALC, ALT, AST, NA, K, CL, CREATININE, BUN, CO2, TSH, PSA, INR, GLUF, HGBA1C, MICROALBUR     Assessment & Plan:

## 2012-01-13 NOTE — Assessment & Plan Note (Signed)
He is doing well on lipitor, I will check his FLP and CMP today 

## 2012-01-13 NOTE — Assessment & Plan Note (Addendum)

## 2012-01-13 NOTE — Patient Instructions (Signed)
Health Maintenance, Males A healthy lifestyle and preventative care can promote health and wellness.  Maintain regular health, dental, and eye exams.  Eat a healthy diet. Foods like vegetables, fruits, whole grains, low-fat dairy products, and lean protein foods contain the nutrients you need without too many calories. Decrease your intake of foods high in solid fats, added sugars, and salt. Get information about a proper diet from your caregiver, if necessary.  Regular physical exercise is one of the most important things you can do for your health. Most adults should get at least 150 minutes of moderate-intensity exercise (any activity that increases your heart rate and causes you to sweat) each week. In addition, most adults need muscle-strengthening exercises on 2 or more days a week.   Maintain a healthy weight. The body mass index (BMI) is a screening tool to identify possible weight problems. It provides an estimate of body fat based on height and weight. Your caregiver can help determine your BMI, and can help you achieve or maintain a healthy weight. For adults 20 years and older:  A BMI below 18.5 is considered underweight.  A BMI of 18.5 to 24.9 is normal.  A BMI of 25 to 29.9 is considered overweight.  A BMI of 30 and above is considered obese.  Maintain normal blood lipids and cholesterol by exercising and minimizing your intake of saturated fat. Eat a balanced diet with plenty of fruits and vegetables. Blood tests for lipids and cholesterol should begin at age 20 and be repeated every 5 years. If your lipid or cholesterol levels are high, you are over 50, or you are a high risk for heart disease, you may need your cholesterol levels checked more frequently.Ongoing high lipid and cholesterol levels should be treated with medicines, if diet and exercise are not effective.  If you smoke, find out from your caregiver how to quit. If you do not use tobacco, do not start.  If you  choose to drink alcohol, do not exceed 2 drinks per day. One drink is considered to be 12 ounces (355 mL) of beer, 5 ounces (148 mL) of wine, or 1.5 ounces (44 mL) of liquor.  Avoid use of street drugs. Do not share needles with anyone. Ask for help if you need support or instructions about stopping the use of drugs.  High blood pressure causes heart disease and increases the risk of stroke. Blood pressure should be checked at least every 1 to 2 years. Ongoing high blood pressure should be treated with medicines if weight loss and exercise are not effective.  If you are 45 to 66 years old, ask your caregiver if you should take aspirin to prevent heart disease.  Diabetes screening involves taking a blood sample to check your fasting blood sugar level. This should be done once every 3 years, after age 45, if you are within normal weight and without risk factors for diabetes. Testing should be considered at a younger age or be carried out more frequently if you are overweight and have at least 1 risk factor for diabetes.  Colorectal cancer can be detected and often prevented. Most routine colorectal cancer screening begins at the age of 50 and continues through age 75. However, your caregiver may recommend screening at an earlier age if you have risk factors for colon cancer. On a yearly basis, your caregiver may provide home test kits to check for hidden blood in the stool. Use of a small camera at the end of a tube,   to directly examine the colon (sigmoidoscopy or colonoscopy), can detect the earliest forms of colorectal cancer. Talk to your caregiver about this at age 50, when routine screening begins. Direct examination of the colon should be repeated every 5 to 10 years through age 75, unless early forms of pre-cancerous polyps or small growths are found.  Hepatitis C blood testing is recommended for all people born from 1945 through 1965 and any individual with known risks for hepatitis C.  Healthy  men should no longer receive prostate-specific antigen (PSA) blood tests as part of routine cancer screening. Consult with your caregiver about prostate cancer screening.  Testicular cancer screening is not recommended for adolescents or adult males who have no symptoms. Screening includes self-exam, caregiver exam, and other screening tests. Consult with your caregiver about any symptoms you have or any concerns you have about testicular cancer.  Practice safe sex. Use condoms and avoid high-risk sexual practices to reduce the spread of sexually transmitted infections (STIs).  Use sunscreen with a sun protection factor (SPF) of 30 or greater. Apply sunscreen liberally and repeatedly throughout the day. You should seek shade when your shadow is shorter than you. Protect yourself by wearing long sleeves, pants, a wide-brimmed hat, and sunglasses year round, whenever you are outdoors.  Notify your caregiver of new moles or changes in moles, especially if there is a change in shape or color. Also notify your caregiver if a mole is larger than the size of a pencil eraser.  A one-time screening for abdominal aortic aneurysm (AAA) and surgical repair of large AAAs by sound wave imaging (ultrasonography) is recommended for ages 65 to 75 years who are current or former smokers.  Stay current with your immunizations. Document Released: 08/08/2007 Document Revised: 05/04/2011 Document Reviewed: 07/07/2010 [employer] Patient Information 2013 [employer].  

## 2012-01-14 DIAGNOSIS — N410 Acute prostatitis: Secondary | ICD-10-CM | POA: Insufficient documentation

## 2012-01-14 MED ORDER — CIPROFLOXACIN HCL 500 MG PO TABS: 500.0000 mg | ORAL_TABLET | Freq: Two times a day (BID) | ORAL | Status: DC

## 2012-01-14 NOTE — Addendum Note (Signed)
Addended by: Etta Grandchild on: 01/14/2012 11:49 AM   Modules accepted: Orders

## 2012-01-14 NOTE — Assessment & Plan Note (Signed)
Labs received from [facility] and his urine looks infected with + WBC's and + leukocyte esterase, he will start cipro

## 2012-01-18 ENCOUNTER — Encounter: Payer: Self-pay | Admitting: Internal Medicine

## 2012-01-18 LAB — CMP14+EGFR
ALT: 20 U/L (ref 10–40)
AST: 19 U/L
Albumin/Globulin Ratio: 1.7
Alkaline Phosphatase: 73 U/L
BUN/Creatinine Ratio: 12
BUN: 13 mg/dL (ref 4–21)
Bilirubin, Total: 0.5
Chloride, Serum: 100
EGFR: 81 mg/dL
Potassium, serum: 4.7
Sodium, serum: 139

## 2012-01-18 LAB — URINALYSIS
Bilirubin (Urine): NEGATIVE
Glucose, Ur: NEGATIVE
Ketones: NEGATIVE
Nitrite, UA: NEGATIVE
Occult Blood: NEGATIVE
Protein, Ur: NEGATIVE
Specific Gravity: 1.022
Urobilinogen, UA: NORMAL
pH: 5.5

## 2012-01-18 LAB — CBC WITH DIFFERENTIAL
EOS: 5 %
HCT: 44 %
Hemoglobin: 15 g/dL (ref 13.5–17.5)
Immature Grans (Abs): 0
Lymphocytes Absolute: 2 /uL
Lymphs: 28
MCHC: 34.4
Monocytes Absolute: 1 /uL
Monocytes: 9
RBC: 4.87

## 2012-01-18 LAB — LIPID PANEL
Cholesterol, Total: 133
HDL: 36 mg/dL (ref 35–70)
LDL Calculated: 74 mg/dL
PSA: 0.9
TSH: 1.32
Triglycerides: 115
VLDL: 23 mg/dL

## 2012-12-29 ENCOUNTER — Other Ambulatory Visit: Payer: Self-pay

## 2013-01-13 ENCOUNTER — Encounter: Payer: 59 | Admitting: Internal Medicine

## 2013-01-16 ENCOUNTER — Other Ambulatory Visit: Payer: Self-pay | Admitting: Internal Medicine

## 2013-01-16 ENCOUNTER — Ambulatory Visit (INDEPENDENT_AMBULATORY_CARE_PROVIDER_SITE_OTHER): Payer: 59 | Admitting: Internal Medicine

## 2013-01-16 ENCOUNTER — Encounter: Payer: Self-pay | Admitting: Internal Medicine

## 2013-01-16 VITALS — BP 144/76 | HR 104 | Temp 98.5°F | Resp 16 | Ht 69.0 in | Wt 210.0 lb

## 2013-01-16 DIAGNOSIS — Z0000 Encounter for general adult medical examination without abnormal findings: Secondary | ICD-10-CM

## 2013-01-16 DIAGNOSIS — E785 Hyperlipidemia, unspecified: Secondary | ICD-10-CM

## 2013-01-16 DIAGNOSIS — N400 Enlarged prostate without lower urinary tract symptoms: Secondary | ICD-10-CM

## 2013-01-16 MED ORDER — ATORVASTATIN CALCIUM 40 MG PO TABS: 40.0000 mg | ORAL_TABLET | Freq: Every day | ORAL | Status: DC

## 2013-01-16 NOTE — Assessment & Plan Note (Signed)
He has no s/s His exam is normal I will check his PSA today

## 2013-01-16 NOTE — Assessment & Plan Note (Signed)
He is doing well on lipitor Will check his labs today

## 2013-01-16 NOTE — Progress Notes (Signed)
  Subjective:    Patient ID: Taylor Torres, male    DOB: 08-04-1945, 67 y.o.   MRN: [id number]  HPI  He returns for a complete physical and he tells me that he feels well.  Review of Systems  Constitutional: Negative.  Negative for fever, chills, diaphoresis, activity change, appetite change, fatigue and unexpected weight change.  HENT: Negative.   Eyes: Negative.   Respiratory: Negative.  Negative for cough, chest tightness, shortness of breath, wheezing and stridor.   Cardiovascular: Negative.  Negative for chest pain, palpitations and leg swelling.  Gastrointestinal: Negative.  Negative for nausea, vomiting, abdominal pain, diarrhea, constipation and blood in stool.  Endocrine: Negative.   Genitourinary: Negative.  Negative for dysuria, urgency, frequency, hematuria, flank pain, decreased urine volume, enuresis, difficulty urinating and testicular pain.  Musculoskeletal: Negative.   Skin: Negative.   Allergic/Immunologic: Negative.   Neurological: Negative.   Hematological: Negative.  Negative for adenopathy. Does not bruise/bleed easily.  Psychiatric/Behavioral: Negative.        Objective:   Physical Exam  Vitals reviewed. Constitutional: He is oriented to person, place, and time. He appears well-developed and well-nourished. No distress.  HENT:  Head: Normocephalic and atraumatic.  Mouth/Throat: Oropharynx is clear and moist. No oropharyngeal exudate.  Eyes: Conjunctivae are normal. Right eye exhibits no discharge. Left eye exhibits no discharge. No scleral icterus.  Neck: Normal range of motion. Neck supple. No JVD present. No tracheal deviation present. No thyromegaly present.  Cardiovascular: Normal rate, regular rhythm, normal heart sounds and intact distal pulses.  Exam reveals no gallop and no friction rub.   No murmur heard. Pulmonary/Chest: Effort normal and breath sounds normal. No stridor. No respiratory distress. He has no wheezes. He has no rales. He exhibits no  tenderness.  Abdominal: Soft. Bowel sounds are normal. He exhibits no distension and no mass. There is no tenderness. There is no rebound and no guarding. Hernia confirmed negative in the right inguinal area and confirmed negative in the left inguinal area.  Genitourinary: Rectum normal, prostate normal, testes normal and penis normal. Rectal exam shows no external hemorrhoid, no internal hemorrhoid, no fissure, no mass, no tenderness and anal tone normal. Guaiac negative stool. Prostate is not enlarged and not tender. Right testis shows no mass, no swelling and no tenderness. Right testis is descended. Left testis shows no mass, no swelling and no tenderness. Left testis is descended. Circumcised. No penile erythema or penile tenderness. No discharge found.  Musculoskeletal: Normal range of motion. He exhibits no edema and no tenderness.  Lymphadenopathy:    He has no cervical adenopathy.       Right: No inguinal adenopathy present.       Left: No inguinal adenopathy present.  Neurological: He is oriented to person, place, and time.  Skin: Skin is warm and dry. No rash noted. He is not diaphoretic. No erythema. No pallor.  Psychiatric: He has a normal mood and affect. His behavior is normal. Judgment and thought content normal.     Lab Results  Component Value Date   WBC 7.2 01/13/2012   WBC 2+ 01/13/2012   HGB 15.0 01/13/2012   HCT 44 01/13/2012   TRIG 115 01/13/2012   HDL 36 01/13/2012   LDLCALC 74 01/13/2012   ALT 20 01/18/2012   AST 19 01/18/2012   CREATININE 1.09 01/18/2012   BUN 13 01/18/2012   TSH 1.320 01/13/2012   PSA 0.9 01/13/2012       Assessment & Plan:

## 2013-01-16 NOTE — Assessment & Plan Note (Addendum)

## 2013-01-16 NOTE — Patient Instructions (Signed)
Health Maintenance, Males A healthy lifestyle and preventative care can promote health and wellness.  Maintain regular health, dental, and eye exams.  Eat a healthy diet. Foods like vegetables, fruits, whole grains, low-fat dairy products, and lean protein foods contain the nutrients you need without too many calories. Decrease your intake of foods high in solid fats, added sugars, and salt. Get information about a proper diet from your caregiver, if necessary.  Regular physical exercise is one of the most important things you can do for your health. Most adults should get at least 150 minutes of moderate-intensity exercise (any activity that increases your heart rate and causes you to sweat) each week. In addition, most adults need muscle-strengthening exercises on 2 or more days a week.   Maintain a healthy weight. The body mass index (BMI) is a screening tool to identify possible weight problems. It provides an estimate of body fat based on height and weight. Your caregiver can help determine your BMI, and can help you achieve or maintain a healthy weight. For adults 20 years and older:  A BMI below 18.5 is considered underweight.  A BMI of 18.5 to 24.9 is normal.  A BMI of 25 to 29.9 is considered overweight.  A BMI of 30 and above is considered obese.  Maintain normal blood lipids and cholesterol by exercising and minimizing your intake of saturated fat. Eat a balanced diet with plenty of fruits and vegetables. Blood tests for lipids and cholesterol should begin at age 20 and be repeated every 5 years. If your lipid or cholesterol levels are high, you are over 50, or you are a high risk for heart disease, you may need your cholesterol levels checked more frequently.Ongoing high lipid and cholesterol levels should be treated with medicines, if diet and exercise are not effective.  If you smoke, find out from your caregiver how to quit. If you do not use tobacco, do not start.  Lung  cancer screening is recommended for adults aged 55 80 years who are at high risk for developing lung cancer because of a history of smoking. Yearly low-dose computed tomography (CT) is recommended for people who have at least a 30-pack-year history of smoking and are a current smoker or have quit within the past 15 years. A pack year of smoking is smoking an average of 1 pack of cigarettes a day for 1 year (for example: 1 pack a day for 30 years or 2 packs a day for 15 years). Yearly screening should continue until the smoker has stopped smoking for at least 15 years. Yearly screening should also be stopped for people who develop a health problem that would prevent them from having lung cancer treatment.  If you choose to drink alcohol, do not exceed 2 drinks per day. One drink is considered to be 12 ounces (355 mL) of beer, 5 ounces (148 mL) of wine, or 1.5 ounces (44 mL) of liquor.  Avoid use of street drugs. Do not share needles with anyone. Ask for help if you need support or instructions about stopping the use of drugs.  High blood pressure causes heart disease and increases the risk of stroke. Blood pressure should be checked at least every 1 to 2 years. Ongoing high blood pressure should be treated with medicines if weight loss and exercise are not effective.  If you are 45 to 67 years old, ask your caregiver if you should take aspirin to prevent heart disease.  Diabetes screening involves taking a blood   sample to check your fasting blood sugar level. This should be done once every 3 years, after age 45, if you are within normal weight and without risk factors for diabetes. Testing should be considered at a younger age or be carried out more frequently if you are overweight and have at least 1 risk factor for diabetes.  Colorectal cancer can be detected and often prevented. Most routine colorectal cancer screening begins at the age of 50 and continues through age 75. However, your caregiver may  recommend screening at an earlier age if you have risk factors for colon cancer. On a yearly basis, your caregiver may provide home test kits to check for hidden blood in the stool. Use of a small camera at the end of a tube, to directly examine the colon (sigmoidoscopy or colonoscopy), can detect the earliest forms of colorectal cancer. Talk to your caregiver about this at age 50, when routine screening begins. Direct examination of the colon should be repeated every 5 to 10 years through age 75, unless early forms of pre-cancerous polyps or small growths are found.  Hepatitis C blood testing is recommended for all people born from 1945 through 1965 and any individual with known risks for hepatitis C.  Healthy men should no longer receive prostate-specific antigen (PSA) blood tests as part of routine cancer screening. Consult with your caregiver about prostate cancer screening.  Testicular cancer screening is not recommended for adolescents or adult males who have no symptoms. Screening includes self-exam, caregiver exam, and other screening tests. Consult with your caregiver about any symptoms you have or any concerns you have about testicular cancer.  Practice safe sex. Use condoms and avoid high-risk sexual practices to reduce the spread of sexually transmitted infections (STIs).  Use sunscreen. Apply sunscreen liberally and repeatedly throughout the day. You should seek shade when your shadow is shorter than you. Protect yourself by wearing long sleeves, pants, a wide-brimmed hat, and sunglasses year round, whenever you are outdoors.  Notify your caregiver of new moles or changes in moles, especially if there is a change in shape or color. Also notify your caregiver if a mole is larger than the size of a pencil eraser.  A one-time screening for abdominal aortic aneurysm (AAA) and surgical repair of large AAAs by sound wave imaging (ultrasonography) is recommended for ages 65 to 75 years who are  current or former smokers.  Stay current with your immunizations. Document Released: 08/08/2007 Document Revised: 06/06/2012 Document Reviewed: 07/07/2010 [employer] Patient Information 2014 [employer].  

## 2013-01-17 LAB — CBC AND DIFFERENTIAL
Hemoglobin: 14.6 g/dL (ref 13.5–17.5)
WBC: 5.6 10^3/mL

## 2013-01-17 LAB — BASIC METABOLIC PANEL
BUN: 20 mg/dL (ref 4–21)
Glucose: 114 mg/dL
Potassium: 4.2 mmol/L (ref 3.4–5.3)
Sodium: 143 mmol/L (ref 137–147)

## 2013-01-17 LAB — HEPATIC FUNCTION PANEL
ALT: 36 U/L (ref 10–40)
AST: 22 U/L (ref 14–40)
Alkaline Phosphatase: 67 U/L (ref 25–125)
Bilirubin, Total: 0.5 mg/dL

## 2013-01-17 LAB — LIPID PANEL
HDL: 32 mg/dL — AB (ref 35–70)
Triglycerides: 144 mg/dL (ref 40–160)

## 2013-01-17 LAB — TSH: TSH: 1.53 u[IU]/mL (ref 0.41–5.90)

## 2013-01-23 ENCOUNTER — Encounter: Payer: Self-pay | Admitting: Internal Medicine

## 2013-01-25 ENCOUNTER — Encounter: Payer: Self-pay | Admitting: Internal Medicine

## 2013-02-02 ENCOUNTER — Encounter: Payer: Self-pay | Admitting: Internal Medicine

## 2014-01-17 ENCOUNTER — Encounter: Payer: 59 | Admitting: Internal Medicine

## 2014-02-01 ENCOUNTER — Encounter: Payer: 59 | Admitting: Internal Medicine

## 2014-02-05 ENCOUNTER — Encounter: Payer: Self-pay | Admitting: Internal Medicine

## 2014-02-05 ENCOUNTER — Other Ambulatory Visit: Payer: Self-pay | Admitting: Internal Medicine

## 2014-02-05 ENCOUNTER — Ambulatory Visit (INDEPENDENT_AMBULATORY_CARE_PROVIDER_SITE_OTHER): Payer: 59 | Admitting: Internal Medicine

## 2014-02-05 VITALS — BP 126/64 | HR 82 | Temp 98.7°F | Resp 16 | Ht 69.0 in | Wt 206.0 lb

## 2014-02-05 DIAGNOSIS — R1314 Dysphagia, pharyngoesophageal phase: Secondary | ICD-10-CM

## 2014-02-05 DIAGNOSIS — Z0000 Encounter for general adult medical examination without abnormal findings: Secondary | ICD-10-CM

## 2014-02-05 DIAGNOSIS — E785 Hyperlipidemia, unspecified: Secondary | ICD-10-CM

## 2014-02-05 LAB — FECAL OCCULT BLOOD, GUAIAC: Fecal Occult Blood: NEGATIVE

## 2014-02-05 NOTE — Assessment & Plan Note (Signed)
He does not want to be treated for GERD I am concerned that there may be some UGI pathology so will refer to GI

## 2014-02-05 NOTE — Progress Notes (Signed)
Pre visit review using our clinic review tool, if applicable. No additional management support is needed unless otherwise documented below in the visit note. 

## 2014-02-05 NOTE — Patient Instructions (Signed)

## 2014-02-05 NOTE — Assessment & Plan Note (Signed)
He is doing well on the statin

## 2014-02-05 NOTE — Assessment & Plan Note (Signed)

## 2014-02-05 NOTE — Progress Notes (Signed)
Subjective:    Patient ID: Taylor Torres, male    DOB: Oct 17, 1945, 68 y.o.   MRN: [id number]  Gastrophageal Reflux He complains of choking and dysphagia. He reports no abdominal pain, no belching, no chest pain, no coughing, no early satiety, no globus sensation, no heartburn, no hoarse voice, no nausea, no sore throat, no stridor, no tooth decay, no water brash or no wheezing. This is a chronic problem. The problem occurs rarely. The problem has been unchanged. Nothing aggravates the symptoms. Pertinent negatives include no anemia, fatigue, melena, muscle weakness, orthopnea or weight loss. Risk factors include obesity. He has tried nothing for the symptoms. The treatment provided no relief.      Review of Systems  Constitutional: Negative.  Negative for fever, chills, weight loss, diaphoresis, appetite change and fatigue.  HENT: Negative.  Negative for hoarse voice and sore throat.   Eyes: Negative.   Respiratory: Positive for choking. Negative for apnea, cough, chest tightness, shortness of breath, wheezing and stridor.   Cardiovascular: Negative.  Negative for chest pain, palpitations and leg swelling.  Gastrointestinal: Positive for dysphagia. Negative for heartburn, nausea, abdominal pain, diarrhea, constipation, blood in stool and melena.  Endocrine: Negative.   Genitourinary: Negative.   Musculoskeletal: Negative.  Negative for myalgias, back pain, arthralgias and muscle weakness.  Skin: Negative.   Allergic/Immunologic: Negative.   Neurological: Negative.  Negative for dizziness.  Hematological: Negative.  Negative for adenopathy. Does not bruise/bleed easily.  Psychiatric/Behavioral: Negative.        Objective:   Physical Exam  Constitutional: He is oriented to person, place, and time. He appears well-developed and well-nourished. No distress.  HENT:  Head: Normocephalic and atraumatic.  Mouth/Throat: Oropharynx is clear and moist. No oropharyngeal exudate.  Eyes:  Conjunctivae are normal. Right eye exhibits no discharge. Left eye exhibits no discharge. No scleral icterus.  Neck: Normal range of motion. Neck supple. No JVD present. No tracheal deviation present. No thyromegaly present.  Cardiovascular: Normal rate, regular rhythm, normal heart sounds and intact distal pulses.  Exam reveals no gallop and no friction rub.   No murmur heard. Pulmonary/Chest: Effort normal and breath sounds normal. No stridor. No respiratory distress. He has no wheezes. He has no rales. He exhibits no tenderness.  Abdominal: Soft. Bowel sounds are normal. He exhibits no distension and no mass. There is no tenderness. There is no rebound and no guarding. Hernia confirmed negative in the right inguinal area and confirmed negative in the left inguinal area.  Genitourinary: Testes normal and penis normal. Right testis shows no mass and no swelling. Right testis is descended. Left testis shows no mass, no swelling and no tenderness. Left testis is descended. Circumcised. No penile erythema or penile tenderness. No discharge found.  Musculoskeletal: Normal range of motion. He exhibits no edema or tenderness.  Lymphadenopathy:    He has no cervical adenopathy.       Right: No inguinal adenopathy present.       Left: No inguinal adenopathy present.  Neurological: He is oriented to person, place, and time.  Skin: Skin is warm and dry. No rash noted. He is not diaphoretic. No erythema. No pallor.  Psychiatric: He has a normal mood and affect. His behavior is normal. Judgment and thought content normal.  Vitals reviewed.    Lab Results  Component Value Date   WBC 5.6 01/17/2013   HGB 14.6 01/17/2013   HCT 43 01/17/2013   PLT 182 01/17/2013   CHOL 123 01/17/2013  TRIG 144 01/17/2013   HDL 32* 01/17/2013   LDLCALC 62 01/17/2013   ALT 36 01/17/2013   AST 22 01/17/2013   NA 143 01/17/2013   K 4.2 01/17/2013   CREATININE 1.0 01/17/2013   BUN 20 01/17/2013   TSH 1.53 01/17/2013     PSA 0.5 01/23/2013       Assessment & Plan:

## 2014-02-19 ENCOUNTER — Encounter: Payer: Self-pay | Admitting: Internal Medicine

## 2014-03-09 DIAGNOSIS — L570 Actinic keratosis: Secondary | ICD-10-CM | POA: Diagnosis not present

## 2014-03-09 DIAGNOSIS — D485 Neoplasm of uncertain behavior of skin: Secondary | ICD-10-CM | POA: Diagnosis not present

## 2014-03-09 DIAGNOSIS — L821 Other seborrheic keratosis: Secondary | ICD-10-CM | POA: Diagnosis not present

## 2014-03-09 DIAGNOSIS — L820 Inflamed seborrheic keratosis: Secondary | ICD-10-CM | POA: Diagnosis not present

## 2014-03-09 DIAGNOSIS — D225 Melanocytic nevi of trunk: Secondary | ICD-10-CM | POA: Diagnosis not present

## 2014-03-29 ENCOUNTER — Ambulatory Visit (INDEPENDENT_AMBULATORY_CARE_PROVIDER_SITE_OTHER): Payer: Medicare Other | Admitting: Internal Medicine

## 2014-03-29 ENCOUNTER — Encounter: Payer: Self-pay | Admitting: Internal Medicine

## 2014-03-29 VITALS — BP 150/70 | HR 84 | Ht 68.25 in | Wt 207.5 lb

## 2014-03-29 DIAGNOSIS — R1310 Dysphagia, unspecified: Secondary | ICD-10-CM | POA: Diagnosis not present

## 2014-03-29 NOTE — Patient Instructions (Signed)

## 2014-03-29 NOTE — Progress Notes (Signed)
HISTORY OF PRESENT ILLNESS:  Taylor Torres is a 69 y.o. male with the below listed medical history who was referred by his primary care provider, Dr. Ronnald Ramp, for evaluation of new onset dysphagia. Patient has a history of adenomatous colon polyps and was last seen December 2011 when he underwent routine surveillance colonoscopy. Severe left-sided diverticulosis was noted but no polyps. Follow-up in 5 years recommended. His current history is that of 12-18 months of dysphagia. He describes food lodging in the throat area approximate once per week. Item such as dry bread or potatoes chips are most problematic. No apparent difficulties with liquids. He does describe occasionally certain foods illicit cough. He rarely has problems with heartburn that occur with dietary indiscretion, particularly late at night. His weight has been stable. There is a strong history of esophageal cancer in both brothers. They were smokers. The patient does not smoke. The patient's weight has been stable. GI review of systems is otherwise negative.  REVIEW OF SYSTEMS:  All non-GI ROS negative upon complete and extensive review of all systems  Past Medical History  Diagnosis Date  . Hyperlipidemia   . BPH (benign prostatic hypertrophy)   . History of colonic polyps   . Family history of diabetes mellitus   . Alcoholism in family   . Diverticulosis     Past Surgical History  Procedure Laterality Date  . None      Social History Taylor Torres  reports that he has never smoked. He has never used smokeless tobacco. He reports that he drinks about 3.0 oz of alcohol per week. He reports that he does not use illicit drugs.  family history includes Alcohol abuse in his other; Colon polyps in his father; Diabetes in his other; Esophageal cancer in his brother and brother. There is no history of Heart disease, Hyperlipidemia, Hypertension, Kidney disease, Stroke, Early death, Colon cancer, or Gallbladder disease.  No Active  Allergies     PHYSICAL EXAMINATION: Vital signs: BP 150/70 mmHg  Pulse 84  Ht 5' 8.25" (1.734 m)  Wt 207 lb 8 oz (94.121 kg)  BMI 31.30 kg/m2 General: Well-developed, well-nourished, no acute distress HEENT: Sclerae are anicteric, conjunctiva pink. Oral mucosa intact Lungs: Clear Heart: Regular Abdomen: soft, nontender, nondistended, no obvious ascites, no peritoneal signs, normal bowel sounds. No organomegaly. Extremities: No edema Psychiatric: alert and oriented x3. Cooperative     ASSESSMENT:  #1. Intermittent dysphasia solids as described. Rule out stricture #2. Strong family history of esophageal cancer in 2 brothers #3. Personal history of adenomatous colon polyps. Last colonoscopy 02/21/2010   PLAN:  #1. Upper endoscopy with possible esophageal dilation.The nature of the procedure, as well as the risks, benefits, and alternatives were carefully and thoroughly reviewed with the patient. Ample time for discussion and questions allowed. The patient understood, was satisfied, and agreed to proceed. #2. Surveillance colonoscopy due in about 11 months. Patient aware

## 2014-04-03 ENCOUNTER — Encounter: Payer: Medicare Other | Admitting: Internal Medicine

## 2014-04-05 ENCOUNTER — Encounter: Payer: Self-pay | Admitting: Internal Medicine

## 2014-04-05 ENCOUNTER — Ambulatory Visit (AMBULATORY_SURGERY_CENTER): Payer: Medicare Other | Admitting: Internal Medicine

## 2014-04-05 VITALS — BP 126/76 | HR 78 | Temp 97.8°F | Resp 22 | Ht 68.0 in | Wt 207.0 lb

## 2014-04-05 DIAGNOSIS — R1310 Dysphagia, unspecified: Secondary | ICD-10-CM | POA: Diagnosis not present

## 2014-04-05 MED ORDER — SODIUM CHLORIDE 0.9 % IV SOLN: 500.0000 mL | INTRAVENOUS | Status: DC

## 2014-04-05 NOTE — Progress Notes (Signed)
No problems noted in the recovery room. maw 

## 2014-04-05 NOTE — Patient Instructions (Signed)
YOU HAD AN ENDOSCOPIC PROCEDURE TODAY AT THE [facility]: Refer to the procedure report that was given to you for any specific questions about what was found during the examination.  If the procedure report does not answer your questions, please call your gastroenterologist to clarify.  If you requested that your care partner not be given the details of your procedure findings, then the procedure report has been included in a sealed envelope for you to review at your convenience later.  YOU SHOULD EXPECT: Some feelings of bloating in the abdomen. Passage of more gas than usual.  Walking can help get rid of the air that was put into your GI tract during the procedure and reduce the bloating. If you had a lower endoscopy (such as a colonoscopy or flexible sigmoidoscopy) you may notice spotting of blood in your stool or on the toilet paper. If you underwent a bowel prep for your procedure, then you may not have a normal bowel movement for a few days.  DIET: Your first meal following the procedure should be a light meal and then it is ok to progress to your normal diet.  A half-sandwich or bowl of soup is an example of a good first meal.  Heavy or fried foods are harder to digest and may make you feel nauseous or bloated.  Likewise meals heavy in dairy and vegetables can cause extra gas to form and this can also increase the bloating.  Drink plenty of fluids but you should avoid alcoholic beverages for 24 hours.  ACTIVITY: Your care partner should take you home directly after the procedure.  You should plan to take it easy, moving slowly for the rest of the day.  You can resume normal activity the day after the procedure however you should NOT DRIVE or use heavy machinery for 24 hours (because of the sedation medicines used during the test).    SYMPTOMS TO REPORT IMMEDIATELY: A gastroenterologist can be reached at any hour.  During normal business hours, 8:30 AM to 5:00 PM Monday through Friday,  call [phone].  After hours and on weekends, please call the GI answering service at [phone] who will take a message and have the physician on call contact you.   Following upper endoscopy (EGD)  Vomiting of blood or coffee ground material  New chest pain or pain under the shoulder blades  Painful or persistently difficult swallowing  New shortness of breath  Fever of 100F or higher  Black, tarry-looking stools  FOLLOW UP: If any biopsies were taken you will be contacted by phone or by letter within the next 1-3 weeks.  Call your gastroenterologist if you have not heard about the biopsies in 3 weeks.  Our staff will call the home number listed on your records the next business day following your procedure to check on you and address any questions or concerns that you may have at that time regarding the information given to you following your procedure. This is a courtesy call and so if there is no answer at the home number and we have not heard from you through the emergency physician on call, we will assume that you have returned to your regular daily activities without incident.  SIGNATURES/CONFIDENTIALITY: You and/or your care partner have signed paperwork which will be entered into your electronic medical record.  These signatures attest to the fact that that the information above on your After Visit Summary has been reviewed and is understood.  Full responsibility   of the confidentiality of this discharge information lies with you and/or your care-partner.     You might notice some irritation in your nose or drainage.  This may cause feelings of congestion.  This is from the oxygen, which can be drying.  This is no cause for concern; this should clear up in a few days.  You may resume your current medications today. Please call if any questions or concerns.

## 2014-04-05 NOTE — Op Note (Signed)
[place]  [address] [place], [address]   ENDOSCOPY PROCEDURE REPORT  PATIENT: Taylor Torres, Taylor Torres  MR#: [id number] BIRTHDATE: 14-Aug-1945 , 68  yrs. old GENDER: male ENDOSCOPIST: Eustace Quail, MD REFERRED BY:  .  Self / Office PROCEDURE DATE:  04/05/2014 PROCEDURE:  EGD, diagnostic ASA CLASS:     Class II INDICATIONS:  dysphagia (mild intermittent solid food x 18 month); TWO brothers with esophageal carcinoma. MEDICATIONS: Monitored anesthesia care and Propofol 150 mg IV TOPICAL ANESTHETIC: none  DESCRIPTION OF PROCEDURE: After the risks benefits and alternatives of the procedure were thoroughly explained, informed consent was obtained.  The LB [id number] endoscope was introduced through the mouth and advanced to the second portion of the duodenum , Without limitations.  The instrument was slowly withdrawn as the mucosa was fully examined.      EXAM: The esophagus and gastroesophageal junction were completely normal in appearance.  The stomach was entered and closely examined.The antrum, angularis, and lesser curvature were well visualized, including a retroflexed view of the cardia and fundus. The stomach wall was normally distensable.  The scope passed easily through the pylorus into the duodenum.  Retroflexed views revealed no abnormalities.     The scope was then withdrawn from the patient and the procedure completed.  COMPLICATIONS: There were no immediate complications.  ENDOSCOPIC IMPRESSION: Normal EGD  RECOMMENDATIONS: Follow-up prn  REPEAT EXAM:  eSigned:  Eustace Quail, MD 04/05/2014 2:11 PM    CC:The Patient and Janith Lima, MD

## 2014-04-06 ENCOUNTER — Telehealth: Payer: Self-pay

## 2014-04-06 NOTE — Telephone Encounter (Signed)
  Follow up Call-  Call back number 04/05/2014  Post procedure Call Back phone  # [phone]  Permission to leave phone message Yes     Patient questions:  Do you have a fever, pain , or abdominal swelling? No. Pain Score  0 *  Have you tolerated food without any problems? Yes.    Have you been able to return to your normal activities? Yes.    Do you have any questions about your discharge instructions: Diet   No. Medications  No. Follow up visit  No.  Do you have questions or concerns about your Care? No.  Actions: * If pain score is 4 or above: No action needed, pain <4.

## 2014-04-11 ENCOUNTER — Encounter: Payer: Self-pay | Admitting: Internal Medicine

## 2014-10-23 ENCOUNTER — Encounter: Payer: Self-pay | Admitting: Internal Medicine

## 2014-11-27 ENCOUNTER — Telehealth: Payer: Self-pay | Admitting: Internal Medicine

## 2014-11-27 ENCOUNTER — Inpatient Hospital Stay (HOSPITAL_COMMUNITY)
Admission: EM | Admit: 2014-11-27 | Discharge: 2014-11-30 | DRG: 378 | Disposition: A | Discharge status: Home / Self Care | Payer: Medicare Other | Attending: Internal Medicine | Admitting: Internal Medicine

## 2014-11-27 ENCOUNTER — Other Ambulatory Visit: Payer: Self-pay | Admitting: *Deleted

## 2014-11-27 ENCOUNTER — Telehealth: Payer: Self-pay | Admitting: *Deleted

## 2014-11-27 ENCOUNTER — Encounter (HOSPITAL_COMMUNITY): Payer: Self-pay | Admitting: Emergency Medicine

## 2014-11-27 DIAGNOSIS — Z79899 Other long term (current) drug therapy: Secondary | ICD-10-CM

## 2014-11-27 DIAGNOSIS — K219 Gastro-esophageal reflux disease without esophagitis: Secondary | ICD-10-CM | POA: Insufficient documentation

## 2014-11-27 DIAGNOSIS — K625 Hemorrhage of anus and rectum: Secondary | ICD-10-CM | POA: Diagnosis not present

## 2014-11-27 DIAGNOSIS — E785 Hyperlipidemia, unspecified: Secondary | ICD-10-CM | POA: Diagnosis not present

## 2014-11-27 DIAGNOSIS — D62 Acute posthemorrhagic anemia: Secondary | ICD-10-CM | POA: Diagnosis present

## 2014-11-27 DIAGNOSIS — K5791 Diverticulosis of intestine, part unspecified, without perforation or abscess with bleeding: Secondary | ICD-10-CM | POA: Diagnosis not present

## 2014-11-27 DIAGNOSIS — Z7982 Long term (current) use of aspirin: Secondary | ICD-10-CM | POA: Diagnosis not present

## 2014-11-27 DIAGNOSIS — Z87438 Personal history of other diseases of male genital organs: Secondary | ICD-10-CM | POA: Diagnosis not present

## 2014-11-27 DIAGNOSIS — Z66 Do not resuscitate: Secondary | ICD-10-CM | POA: Diagnosis not present

## 2014-11-27 DIAGNOSIS — N400 Enlarged prostate without lower urinary tract symptoms: Secondary | ICD-10-CM | POA: Diagnosis not present

## 2014-11-27 DIAGNOSIS — R739 Hyperglycemia, unspecified: Secondary | ICD-10-CM | POA: Diagnosis present

## 2014-11-27 DIAGNOSIS — K5731 Diverticulosis of large intestine without perforation or abscess with bleeding: Secondary | ICD-10-CM | POA: Diagnosis not present

## 2014-11-27 LAB — CBC
HCT: 40.8 % (ref 39.0–52.0)
Hemoglobin: 13.9 g/dL (ref 13.0–17.0)
MCH: 31.1 pg (ref 26.0–34.0)
MCHC: 34.1 g/dL (ref 30.0–36.0)
MCV: 91.3 fL (ref 78.0–100.0)
PLATELETS: 200 10*3/uL (ref 150–400)
RBC: 4.47 MIL/uL (ref 4.22–5.81)
RDW: 12.9 % (ref 11.5–15.5)
WBC: 12.4 10*3/uL — AB (ref 4.0–10.5)

## 2014-11-27 LAB — COMPREHENSIVE METABOLIC PANEL
ALBUMIN: 4.5 g/dL (ref 3.5–5.0)
ALK PHOS: 59 U/L (ref 38–126)
ALT: 37 U/L (ref 17–63)
AST: 31 U/L (ref 15–41)
Anion gap: 7 (ref 5–15)
BILIRUBIN TOTAL: 0.5 mg/dL (ref 0.3–1.2)
BUN: 20 mg/dL (ref 6–20)
CALCIUM: 9.6 mg/dL (ref 8.9–10.3)
CO2: 27 mmol/L (ref 22–32)
CREATININE: 1.1 mg/dL (ref 0.61–1.24)
Chloride: 105 mmol/L (ref 101–111)
GFR calc Af Amer: >60 mL/min (ref >60)
GLUCOSE: 191 mg/dL — AB (ref 65–99)
POTASSIUM: 4.3 mmol/L (ref 3.5–5.1)
Sodium: 139 mmol/L (ref 135–145)
TOTAL PROTEIN: 7.6 g/dL (ref 6.5–8.1)

## 2014-11-27 MED ORDER — SODIUM CHLORIDE 0.9 % IV BOLUS (SEPSIS)
1000.0000 mL | Freq: Once | INTRAVENOUS | Status: AC
  Administered 2014-11-27: 1000 mL via INTRAVENOUS

## 2014-11-27 MED ORDER — HYDROCORTISONE ACETATE 25 MG RE SUPP: RECTAL | Status: DC

## 2014-11-27 MED ORDER — ONDANSETRON HCL 4 MG/2ML IJ SOLN: 4.0000 mg | Freq: Three times a day (TID) | INTRAMUSCULAR | Status: DC | PRN

## 2014-11-27 NOTE — Telephone Encounter (Signed)
Receive call pt states he is having episode of bleeding hemorrhoids. MD rx some suppository before wanting to get a refill sent to [facility]. Inform pt will send electronically...Taylor Torres

## 2014-11-27 NOTE — ED Notes (Signed)
Per pt, states he has a history of hemorrhoids-blood in stool today X 4

## 2014-11-27 NOTE — ED Provider Notes (Signed)
CSN: [id number]     Arrival date & time 11/27/14  1737 History   First MD Initiated Contact with Patient 11/27/14 2050     Chief Complaint  Patient presents with  . Hemorrhoids     (Consider location/radiation/quality/duration/timing/severity/associated sxs/prior Treatment) Patient is a 69 y.o. male presenting with hematochezia. The history is provided by the patient. No language interpreter was used.  Rectal Bleeding Quality:  Bright red Amount:  Moderate Duration:  1 day Timing:  Constant Progression:  Worsening Chronicity:  New Similar prior episodes: no   Relieved by:  Nothing Worsened by:  Nothing tried Ineffective treatments:  None tried Associated symptoms: light-headedness   Associated symptoms: no fever and no vomiting   Risk factors: no anticoagulant use     Past Medical History  Diagnosis Date  . Hyperlipidemia   . BPH (benign prostatic hypertrophy)   . History of colonic polyps   . Family history of diabetes mellitus   . Alcoholism in family   . Diverticulosis    Past Surgical History  Procedure Laterality Date  . None     Family History  Problem Relation Age of Onset  . Alcohol abuse Other   . Diabetes Other   . Heart disease Neg Hx   . Hyperlipidemia Neg Hx   . Hypertension Neg Hx   . Kidney disease Neg Hx   . Stroke Neg Hx   . Early death Neg Hx   . Esophageal cancer Brother     Dead  . Esophageal cancer Brother     alive  . Colon cancer Neg Hx   . Colon polyps Father   . Gallbladder disease Neg Hx    Social History  Substance Use Topics  . Smoking status: Never Smoker   . Smokeless tobacco: Never Used     Comment: Regular exercise - Yes  . Alcohol Use: 3.0 oz/week    5 Glasses of wine per week    Review of Systems  Constitutional: Negative for fever.  Gastrointestinal: Positive for blood in stool and hematochezia. Negative for vomiting.  Neurological: Positive for light-headedness.  All other systems reviewed and are  negative.     Allergies  Review of patient's allergies indicates no active allergies.  Home Medications   Prior to Admission medications   Medication Sig Start Date End Date Taking? Authorizing Provider  Amino Acids (AMINO ACID PO) Take by mouth.    Historical Provider, MD  aspirin 325 MG tablet Take 325 mg by mouth daily.      Historical Provider, MD  atorvastatin (LIPITOR) 40 MG tablet Take 1 tablet (40 mg total) by mouth daily. 01/16/13   Janith Lima, MD  B Complex-Biotin-FA (B-COMPLEX PO) Take 1 tablet by mouth daily.      Historical Provider, MD  Coenzyme Q10 200 MG capsule Take 200 mg by mouth daily.    Historical Provider, MD  Digestive Enzymes (MULTI-ENZYME PO) Take 1 tablet by mouth daily.      Historical Provider, MD  hydrocortisone (ANUSOL-HC) 25 MG suppository Use 1 per rectum qhs times 7 days 11/27/14   Janith Lima, MD  MAGNESIUM-ZINC PO Take 1 tablet by mouth daily.    Historical Provider, MD  Multiple Vitamins-Minerals (MULTIVITAMIN WITH MINERALS) tablet Take 1 tablet by mouth daily.      Historical Provider, MD  Omega 3-6-9 Fatty Acids (TRIPLE OMEGA-3-6-9) CAPS Take by mouth.      Historical Provider, MD   BP 134/64 mmHg  Pulse 98  Temp(Src) 97.9 F (36.6 C) (Oral)  Resp 16  SpO2 100% Physical Exam  Constitutional: He is oriented to person, place, and time. He appears well-developed and well-nourished.  HENT:  Head: Normocephalic and atraumatic.  Right Ear: External ear normal.  Left Ear: External ear normal.  Nose: Nose normal.  Mouth/Throat: Oropharynx is clear and moist.  Eyes: Conjunctivae and EOM are normal. Pupils are equal, round, and reactive to light.  Neck: Normal range of motion.  Cardiovascular: Normal rate and normal heart sounds.   Pulmonary/Chest: Effort normal.  Abdominal: He exhibits no distension.  Musculoskeletal: Normal range of motion.  Neurological: He is alert and oriented to person, place, and time.  Skin: Skin is warm.   Psychiatric: He has a normal mood and affect.  Nursing note and vitals reviewed.   ED Course  Procedures (including critical care time) Labs Review Labs Reviewed  COMPREHENSIVE METABOLIC PANEL - Abnormal; Notable for the following:    Glucose, Bld 191 (*)    All other components within normal limits  CBC - Abnormal; Notable for the following:    WBC 12.4 (*)    All other components within normal limits  POC OCCULT BLOOD, ED    Imaging Review No results found. I have personally reviewed and evaluated these images and lab results as part of my medical decision-making.   EKG Interpretation None      MDM   Final diagnoses:  Rectal bleeding    I spoke with Dr. Delford Field who will admit for further evaluation    Fransico Meadow, PA-C 11/27/14 [place], MD 11/28/14 [phone]

## 2014-11-27 NOTE — ED Notes (Signed)
Pt states he has had bright red rectal bleeding since last night.  Pt states he has had four episodes since onset.  Pt states he had episode of feeling lightheaded today but denies this currently.  Pt states he has had loose stool x several days.

## 2014-11-28 ENCOUNTER — Telehealth: Payer: Self-pay

## 2014-11-28 ENCOUNTER — Encounter (HOSPITAL_COMMUNITY): Payer: Self-pay | Admitting: Internal Medicine

## 2014-11-28 DIAGNOSIS — D62 Acute posthemorrhagic anemia: Secondary | ICD-10-CM

## 2014-11-28 DIAGNOSIS — K5731 Diverticulosis of large intestine without perforation or abscess with bleeding: Secondary | ICD-10-CM

## 2014-11-28 LAB — CBC
HEMATOCRIT: 32.3 % — AB (ref 39.0–52.0)
HEMATOCRIT: 31.8 % — AB (ref 39.0–52.0)
HEMATOCRIT: 33.2 % — AB (ref 39.0–52.0)
HEMATOCRIT: 30.7 % — AB (ref 39.0–52.0)
HEMOGLOBIN: 10.7 g/dL — AB (ref 13.0–17.0)
HEMOGLOBIN: 10.3 g/dL — AB (ref 13.0–17.0)
Hemoglobin: 11.2 g/dL — ABNORMAL LOW (ref 13.0–17.0)
Hemoglobin: 11.3 g/dL — ABNORMAL LOW (ref 13.0–17.0)
MCH: 31.8 pg (ref 26.0–34.0)
MCH: 30.7 pg (ref 26.0–34.0)
MCH: 31.3 pg (ref 26.0–34.0)
MCH: 30.5 pg (ref 26.0–34.0)
MCHC: 34.7 g/dL (ref 30.0–36.0)
MCHC: 33.6 g/dL (ref 30.0–36.0)
MCHC: 34 g/dL (ref 30.0–36.0)
MCHC: 33.6 g/dL (ref 30.0–36.0)
MCV: 91.8 fL (ref 78.0–100.0)
MCV: 91.4 fL (ref 78.0–100.0)
MCV: 92 fL (ref 78.0–100.0)
MCV: 90.8 fL (ref 78.0–100.0)
Platelets: 164 10*3/uL (ref 150–400)
Platelets: 167 10*3/uL (ref 150–400)
Platelets: 174 10*3/uL (ref 150–400)
Platelets: 158 10*3/uL (ref 150–400)
RBC: 3.52 MIL/uL — AB (ref 4.22–5.81)
RBC: 3.48 MIL/uL — AB (ref 4.22–5.81)
RBC: 3.61 MIL/uL — AB (ref 4.22–5.81)
RBC: 3.38 MIL/uL — ABNORMAL LOW (ref 4.22–5.81)
RDW: 13.1 % (ref 11.5–15.5)
RDW: 13.1 % (ref 11.5–15.5)
RDW: 12.9 % (ref 11.5–15.5)
RDW: 13 % (ref 11.5–15.5)
WBC: 8.1 10*3/uL (ref 4.0–10.5)
WBC: 7.7 10*3/uL (ref 4.0–10.5)
WBC: 8.1 10*3/uL (ref 4.0–10.5)
WBC: 7.3 10*3/uL (ref 4.0–10.5)

## 2014-11-28 LAB — URINALYSIS, ROUTINE W REFLEX MICROSCOPIC
Bilirubin Urine: NEGATIVE
GLUCOSE, UA: NEGATIVE mg/dL
HGB URINE DIPSTICK: NEGATIVE
Ketones, ur: NEGATIVE mg/dL
Leukocytes, UA: NEGATIVE
Nitrite: NEGATIVE
PH: 6 (ref 5.0–8.0)
Protein, ur: NEGATIVE mg/dL
SPECIFIC GRAVITY, URINE: 1.02 (ref 1.005–1.030)
Urobilinogen, UA: 0.2 mg/dL (ref 0.0–1.0)

## 2014-11-28 LAB — GLUCOSE, CAPILLARY
GLUCOSE-CAPILLARY: 132 mg/dL — AB (ref 65–99)
Glucose-Capillary: 106 mg/dL — ABNORMAL HIGH (ref 65–99)

## 2014-11-28 LAB — COMPREHENSIVE METABOLIC PANEL
ALK PHOS: 47 U/L (ref 38–126)
ALT: 30 U/L (ref 17–63)
ANION GAP: 7 (ref 5–15)
AST: 22 U/L (ref 15–41)
Albumin: 3.7 g/dL (ref 3.5–5.0)
BILIRUBIN TOTAL: 0.9 mg/dL (ref 0.3–1.2)
BUN: 16 mg/dL (ref 6–20)
CALCIUM: 8.3 mg/dL — AB (ref 8.9–10.3)
CO2: 25 mmol/L (ref 22–32)
Chloride: 107 mmol/L (ref 101–111)
Creatinine, Ser: 0.91 mg/dL (ref 0.61–1.24)
GFR calc non Af Amer: >60 mL/min (ref >60)
Glucose, Bld: 149 mg/dL — ABNORMAL HIGH (ref 65–99)
Potassium: 3.7 mmol/L (ref 3.5–5.1)
Sodium: 139 mmol/L (ref 135–145)
TOTAL PROTEIN: 5.9 g/dL — AB (ref 6.5–8.1)

## 2014-11-28 LAB — TYPE AND SCREEN
ABO/RH(D): O POS
Antibody Screen: NEGATIVE

## 2014-11-28 LAB — MRSA PCR SCREENING: MRSA BY PCR: NEGATIVE

## 2014-11-28 LAB — ABO/RH: ABO/RH(D): O POS

## 2014-11-28 MED ORDER — ACETAMINOPHEN 650 MG RE SUPP: 650.0000 mg | Freq: Four times a day (QID) | RECTAL | Status: DC | PRN

## 2014-11-28 MED ORDER — ONDANSETRON HCL 4 MG PO TABS
4.0000 mg | ORAL_TABLET | Freq: Four times a day (QID) | ORAL | Status: DC | PRN
Start: 2014-11-28 — End: 2014-11-30

## 2014-11-28 MED ORDER — ONDANSETRON HCL 4 MG/2ML IJ SOLN: 4.0000 mg | Freq: Four times a day (QID) | INTRAMUSCULAR | Status: DC | PRN

## 2014-11-28 MED ORDER — DIPHENHYDRAMINE HCL 25 MG PO CAPS: 25.0000 mg | ORAL_CAPSULE | Freq: Every evening | ORAL | Status: DC | PRN

## 2014-11-28 MED ORDER — ZOLPIDEM TARTRATE 5 MG PO TABS
5.0000 mg | ORAL_TABLET | Freq: Every evening | ORAL | Status: DC | PRN
  Administered 2014-11-28 – 2014-11-29 (×2): 5 mg via ORAL
  Filled 2014-11-28 (×2): qty 1

## 2014-11-28 MED ORDER — ACETAMINOPHEN 325 MG PO TABS
650.0000 mg | ORAL_TABLET | Freq: Four times a day (QID) | ORAL | Status: DC | PRN
  Administered 2014-11-28: 650 mg via ORAL
  Filled 2014-11-28: qty 2

## 2014-11-28 MED ORDER — ACETAMINOPHEN 325 MG PO TABS
650.0000 mg | ORAL_TABLET | ORAL | Status: DC | PRN
  Administered 2014-11-29 (×2): 650 mg via ORAL
  Filled 2014-11-28 (×2): qty 2

## 2014-11-28 MED ORDER — HYDROCORTISONE 2.5 % RE CREA: 1.0000 "application " | TOPICAL_CREAM | Freq: Two times a day (BID) | RECTAL | Status: DC

## 2014-11-28 MED ORDER — SODIUM CHLORIDE 0.9 % IV SOLN
INTRAVENOUS | Status: DC
  Administered 2014-11-28 (×3): via INTRAVENOUS

## 2014-11-28 MED ORDER — ACETAMINOPHEN 650 MG RE SUPP: 650.0000 mg | RECTAL | Status: DC | PRN

## 2014-11-28 MED ORDER — PANTOPRAZOLE SODIUM 40 MG IV SOLR
40.0000 mg | Freq: Two times a day (BID) | INTRAVENOUS | Status: DC
  Administered 2014-11-28 (×3): 40 mg via INTRAVENOUS
  Filled 2014-11-28 (×3): qty 40

## 2014-11-28 NOTE — Telephone Encounter (Signed)
Left message for pt to call back  °

## 2014-11-28 NOTE — Progress Notes (Signed)
Patient had blood in his bowel movement @ 1625.  The bowel movement was formed and soft. Medium amount of blood mixed with the stool. Patient has no complaints of dizziness. Patient returned to bed. Will continue to monitor patient.

## 2014-11-28 NOTE — Telephone Encounter (Signed)
Sent in generic anusol cream

## 2014-11-28 NOTE — H&P (Signed)
[facility] History and Physical  CAYDE HELD DGU:[id number] DOB: 1945-09-26 DOA: 11/27/2014  Referring physician: Ms. Threasa Alpha. PCP: Scarlette Calico, MD  Specialists: Dr. Henrene Pastor. Gastroenterologist.  Chief Complaint: Rectal bleeding.  HPI: Taylor Torres is a 69 y.o. male with history of hyperlipidemia presents to the ER because of rectal bleeding. Patient had at least 3 episodes of rectal bleeding at home prior to arrival. Patient's first episode started around midnight yesterday. Patient has benign frank rectal bleeding. Had some mild dizziness but denies any syncope chest pain or shortness of breath. In the ER patient had 2 more episodes of frank rectal bleeding. Denies any abdominal pain nausea vomiting. Patient has been admitted for further management of rectal bleeding. In February of this year patient had EGD which was unremarkable. Patient had colonoscopy in 2011 which showed severe diverticulosis. Patient is also on aspirin for prophylaxis. Denies any NSAID use.   Review of Systems: As presented in the history of presenting illness, rest negative.  Past Medical History  Diagnosis Date  . Hyperlipidemia   . BPH (benign prostatic hypertrophy)   . History of colonic polyps   . Family history of diabetes mellitus   . Alcoholism in family   . Diverticulosis    Past Surgical History  Procedure Laterality Date  . None     Social History:  reports that he has never smoked. He has never used smokeless tobacco. He reports that he drinks about 3.0 oz of alcohol per week. He reports that he does not use illicit drugs. Where does patient live home. Can patient participate in ADLs? Yes.  No Known Allergies  Family History:  Family History  Problem Relation Age of Onset  . Alcohol abuse Other   . Diabetes Other   . Heart disease Neg Hx   . Hyperlipidemia Neg Hx   . Hypertension Neg Hx   . Kidney disease Neg Hx   . Stroke Neg Hx   . Early death Neg Hx   . Esophageal cancer  Brother     Dead  . Esophageal cancer Brother     alive  . Colon cancer Neg Hx   . Colon polyps Father   . Gallbladder disease Neg Hx       Prior to Admission medications   Medication Sig Start Date End Date Taking? Authorizing Provider  Amino Acids (AMINO ACID PO) Take 1 capsule by mouth daily.    Yes Historical Provider, MD  aspirin 325 MG tablet Take 325 mg by mouth daily.     Yes Historical Provider, MD  atorvastatin (LIPITOR) 40 MG tablet Take 1 tablet (40 mg total) by mouth daily. 01/16/13  Yes Janith Lima, MD  B Complex-Biotin-FA (B-COMPLEX PO) Take 1 tablet by mouth daily.     Yes Historical Provider, MD  CINNAMON PO Take 1 capsule by mouth daily.   Yes Historical Provider, MD  Digestive Enzymes (MULTI-ENZYME PO) Take 1 tablet by mouth daily.     Yes Historical Provider, MD  MAGNESIUM-ZINC PO Take 1 tablet by mouth daily.   Yes Historical Provider, MD  Multiple Vitamins-Minerals (MULTIVITAMIN WITH MINERALS) tablet Take 1 tablet by mouth daily.     Yes Historical Provider, MD  Omega 3-6-9 Fatty Acids (TRIPLE OMEGA-3-6-9) CAPS Take 1 capsule by mouth daily.    Yes Historical Provider, MD  hydrocortisone (ANUSOL-HC) 25 MG suppository Use 1 per rectum qhs times 7 days Patient taking differently: Place 25 mg rectally at bedtime as needed for hemorrhoids.  Use 1 per rectum qhs times 7 days 11/27/14   Janith Lima, MD    Physical Exam: Filed Vitals:   11/27/14 1759 11/27/14 2208  BP: 134/64 128/62  Pulse: 98 90  Temp: 97.9 F (36.6 C)   TempSrc: Oral   Resp: 16 16  SpO2: 100% 93%     General:  Moderately built and nourished.  Eyes: Anicteric no pallor.  ENT: No discharge from the ears eyes nose and mouth.  Neck: No mass felt.  Cardiovascular: S1 and S2 heard.  Respiratory: No rhonchi or crepitations.  Abdomen: Soft nontender bowel sounds present.  Skin: No rash.  Musculoskeletal: No edema.  Psychiatric: Appears normal.  Neurologic: Alert awake oriented to  time place and person. Moves all extremities.  Labs on Admission:  Basic Metabolic Panel:  Recent Labs Lab 11/27/14 1814  NA 139  K 4.3  CL 105  CO2 27  GLUCOSE 191*  BUN 20  CREATININE 1.10  CALCIUM 9.6   Liver Function Tests:  Recent Labs Lab 11/27/14 1814  AST 31  ALT 37  ALKPHOS 59  BILITOT 0.5  PROT 7.6  ALBUMIN 4.5   No results for input(s): LIPASE, AMYLASE in the last 168 hours. No results for input(s): AMMONIA in the last 168 hours. CBC:  Recent Labs Lab 11/27/14 1814  WBC 12.4*  HGB 13.9  HCT 40.8  MCV 91.3  PLT 200   Cardiac Enzymes: No results for input(s): CKTOTAL, CKMB, CKMBINDEX, TROPONINI in the last 168 hours.  BNP (last 3 results) No results for input(s): BNP in the last 8760 hours.  ProBNP (last 3 results) No results for input(s): PROBNP in the last 8760 hours.  CBG: No results for input(s): GLUCAP in the last 168 hours.  Radiological Exams on Admission: No results found.   Assessment/Plan Principal Problem:   Rectal bleeding Active Problems:   Hyperlipidemia with target LDL less than 130   1. Rectal bleeding - most likely diverticular in origin. Discontinue aspirin. Continue with hydration. Closely follow CBC. Patient has been kept nothing by mouth. Consult patient's gastroenterologist Dr. Henrene Pastor in a.m. Since colonoscopy in 2011 showed severe diverticulosis. If patient continues to have bleeding through the night or becomes hypotensive we'll consult GI earlier. Type and screen. Protonix IV. 2. Hyperglycemia - check hemoglobin A1c. 3. Hyperlipidemia - continue home medicines once patient can take orally.  I have reviewed patient's old charts and labs.   DVT Prophylaxis SCDs.  Code Status: DO NOT RESUSCITATE.  Family Communication: Patient's wife at the bedside.  Disposition Plan: Admit to inpatient.    KAKRAKANDY,ARSHAD N. [facility] Pager [phone].  If 7PM-7AM, please contact  night-coverage [email or web address] Password [email or web address] 11/28/2014, 12:48 AM

## 2014-11-28 NOTE — Care Management Note (Signed)
Case Management Note  Patient Details  Name: Taylor Torres MRN: [id number] Date of Birth: Dec 28, 1945  Subjective/Objective:  69 y/o m admitted w/Rectal Bleed. From home.                  Action/Plan:d/c plan home.   Expected Discharge Date:   (uncertain)               Expected Discharge Plan:  Home/Self Care  In-House Referral:     Discharge planning Services  CM Consult  Post Acute Care Choice:    Choice offered to:     DME Arranged:    DME Agency:     HH Arranged:    HH Agency:     Status of Service:  In process, will continue to follow  Medicare Important Message Given:    Date Medicare IM Given:    Medicare IM give by:    Date Additional Medicare IM Given:    Additional Medicare Important Message give by:     If discussed at [place] of Stay Meetings, dates discussed:    Additional Comments:  Dessa Phi, RN 11/28/2014, 4:56 PM

## 2014-11-28 NOTE — Progress Notes (Signed)
Patient seen and examined. Admitted after midnight secondary to BRBPR. Patient reports slight dizziness, but denies CP, SOB, abd pain, nausea, vomiting, fever and chills. Patient has had approx 4-5 episodes of bright red blood per rectum in the last 36 hours; 2 out of these episodes happened while in ED. Patient is hemodynamically stable and his Hgb is 11.9. Please referred to H&P written by Dr. Hal Hope for further info/details on admission.  Plan: -full liquid diet -follow Hgb trend -GI consultation, will follow rec's  Barton Dubois [phone]

## 2014-11-28 NOTE — Consult Note (Signed)
Referring Provider:  Dr. Dyann Kief Primary Care Physician:  Scarlette Calico, MD Primary Gastroenterologist:  Dr. Henrene Pastor  Reason for Consultation:  GI bleed  HPI: Taylor Torres is a 69 y.o. male with past medical history of hyperlipidemia who presented to the ER at [facility] hospital last evening because of rectal bleeding. Patient had at least 3 episodes of rectal bleeding at home prior to arrival.  With one episode he felt like he was going to pass out, which prompted his ER visit.  Blood described as red, no black stools. In the ER patient had 2 more episodes of frank rectal bleeding. Denies any abdominal pain, nausea, vomiting, or any other complaints.  In February of this year patient had EGD which was unremarkable. Patient had colonoscopy in 01/2010 which showed severe diverticulosis. Patient is also on aspirin 325 mg daily for prophylaxis. Denies any NSAID use.   He is hemodynamically stable.  Hgb on year ago was 14.6 grams, on presentation was 13.9 grams, this AM was 10.7 grams, and recent recheck was 11.3 grams.  Says that bleeding seems to have slowed.  Passed a small amount of bright red blood around 6 am and none since that time.   Past Medical History  Diagnosis Date  . Hyperlipidemia   . BPH (benign prostatic hypertrophy)   . History of colonic polyps   . Family history of diabetes mellitus   . Alcoholism in family   . Diverticulosis     Past Surgical History  Procedure Laterality Date  . None      Prior to Admission medications   Medication Sig Start Date End Date Taking? Authorizing Provider  Amino Acids (AMINO ACID PO) Take 1 capsule by mouth daily.    Yes Historical Provider, MD  aspirin 325 MG tablet Take 325 mg by mouth daily.     Yes Historical Provider, MD  atorvastatin (LIPITOR) 40 MG tablet Take 1 tablet (40 mg total) by mouth daily. 01/16/13  Yes Janith Lima, MD  B Complex-Biotin-FA (B-COMPLEX PO) Take 1 tablet by mouth daily.     Yes Historical Provider, MD    CINNAMON PO Take 1 capsule by mouth daily.   Yes Historical Provider, MD  Digestive Enzymes (MULTI-ENZYME PO) Take 1 tablet by mouth daily.     Yes Historical Provider, MD  MAGNESIUM-ZINC PO Take 1 tablet by mouth daily.   Yes Historical Provider, MD  Multiple Vitamins-Minerals (MULTIVITAMIN WITH MINERALS) tablet Take 1 tablet by mouth daily.     Yes Historical Provider, MD  Omega 3-6-9 Fatty Acids (TRIPLE OMEGA-3-6-9) CAPS Take 1 capsule by mouth daily.    Yes Historical Provider, MD  hydrocortisone (ANUSOL-HC) 25 MG suppository Use 1 per rectum qhs times 7 days Patient taking differently: Place 25 mg rectally at bedtime as needed for hemorrhoids. Use 1 per rectum qhs times 7 days 11/27/14   Janith Lima, MD    Current Facility-Administered Medications  Medication Dose Route Frequency Provider Last Rate Last Dose  . 0.9 %  sodium chloride infusion   Intravenous Continuous Rise Patience, MD 125 mL/hr at 11/28/14 [phone]    . acetaminophen (TYLENOL) tablet 650 mg  650 mg Oral Q6H PRN Rise Patience, MD       Or  . acetaminophen (TYLENOL) suppository 650 mg  650 mg Rectal Q6H PRN Rise Patience, MD      . ondansetron [facility]) tablet 4 mg  4 mg Oral Q6H PRN Rise Patience, MD  Or  . ondansetron (ZOFRAN) injection 4 mg  4 mg Intravenous Q6H PRN Rise Patience, MD      . pantoprazole (PROTONIX) injection 40 mg  40 mg Intravenous Q12H Rise Patience, MD   40 mg at 11/28/14 1012    Allergies as of 11/27/2014  . (No Known Allergies)    Family History  Problem Relation Age of Onset  . Alcohol abuse Other   . Diabetes Other   . Heart disease Neg Hx   . Hyperlipidemia Neg Hx   . Hypertension Neg Hx   . Kidney disease Neg Hx   . Stroke Neg Hx   . Early death Neg Hx   . Esophageal cancer Brother     Dead  . Esophageal cancer Brother     alive  . Colon cancer Neg Hx   . Colon polyps Father   . Gallbladder disease Neg Hx     Social History   Social  History  . Marital Status: Divorced    Spouse Name: N/A  . Number of Children: 2  . Years of Education: N/A   Occupational History  . Retired    Social History Main Topics  . Smoking status: Never Smoker   . Smokeless tobacco: Never Used     Comment: Regular exercise - Yes  . Alcohol Use: 3.0 oz/week    5 Glasses of wine per week  . Drug Use: No  . Sexual Activity: Not Currently   Other Topics Concern  . Not on file   Social History Narrative    Review of Systems: Ten point ROS is O/W negative except as mentioned in HPI.  Physical Exam: Vital signs in last 24 hours: Temp:  [97.9 F (36.6 C)-98.6 F (37 C)] 98.6 F (37 C) (10/05 0800) Pulse Rate:  [85-102] 85 (10/05 0900) Resp:  [11-29] 11 (10/05 0900) BP: (116-162)/(49-77) 126/63 mmHg (10/05 0900) SpO2:  [93 %-100 %] 96 % (10/05 0900) Weight:  [205 lb 4 oz (93.1 kg)] 205 lb 4 oz (93.1 kg) (10/05 0255) Last BM Date: 11/27/14 General:  Alert, well-developed, well-nourished, pleasant and cooperative in NAD Head:  Normocephalic and atraumatic. Eyes:  Sclera clear, no icterus.  Conjunctiva pink. Ears:  Normal auditory acuity. Mouth:  No deformity or lesions.   Lungs:  Clear throughout to auscultation.  No wheezes, crackles, or rhonchi.  Heart:  Regular rate and rhythm; no murmurs, clicks, rubs, or gallops. Abdomen:  Soft, non-distended.  BS present.  Non-tender.  Rectal:  Deferred  Msk:  Symmetrical without gross deformities. Pulses:  Normal pulses noted. Extremities:  Without clubbing or edema. Neurologic:  Alert and  oriented x4;  grossly normal neurologically. Skin:  Intact without significant lesions or rashes. Psych:  Alert and cooperative. Normal mood and affect.  Intake/Output from previous day: 10/04 0701 - 10/05 0700 In: 445.8 [I.V.:445.8] Out: 300 [Urine:300]   Lab Results:  Recent Labs  11/27/14 1814 11/28/14 0304 11/28/14 0729  WBC 12.4* 8.1 7.7  HGB 13.9 11.2* 10.7*  HCT 40.8 32.3* 31.8*    PLT 200 164 167   BMET  Recent Labs  11/27/14 1814 11/28/14 0345  NA 139 139  K 4.3 3.7  CL 105 107  CO2 27 25  GLUCOSE 191* 149*  BUN 20 16  CREATININE 1.10 0.91  CALCIUM 9.6 8.3*   LFT  Recent Labs  11/28/14 0345  PROT 5.9*  ALBUMIN 3.7  AST 22  ALT 30  ALKPHOS 47  BILITOT 0.9  IMPRESSION:  -GI bleed:  Sounds like lower source.  Most likely diverticular.  Already seems to be resolving at this point. -Acute blood loss anemia:  Hgb down 2 grams from time of presentation.  11.3 grams most recent result.  PLAN: -Continue to monitor/observe at least until tomorrow evening as bleeding recurrence is the highest within the first 48-72 hours. -Monitor Hgb and transfuse prn. -Nuc med bleeding scan if re-bleeds briskly.   ZEHR, JESSICA D.  11/28/2014, 10:20 AM  Pager number [phone]      Attending physician's note   I have taken a history, examined the patient and reviewed the chart. I agree with the Advanced Practitioner's note, impression and recommendations. Acute GI bleed with LGI source suspected, probably diverticular. ABL anemia. Hold ASA. Liquid diet, trend Hb, rest and observe for rebleeding. Nuc med scan if brisk rebleeding occurs. Will reserve repeat colonoscopy for significant recurrent bleeding.   Lucio Edward, MD Marval Regal [phone] Mon-Fri 8a-5p [phone] after 5p, weekends, holidays

## 2014-11-28 NOTE — Telephone Encounter (Signed)
PA initiated for [facility]. DENIED. Ins will not cover. Alternative?

## 2014-11-29 DIAGNOSIS — K219 Gastro-esophageal reflux disease without esophagitis: Secondary | ICD-10-CM

## 2014-11-29 DIAGNOSIS — E785 Hyperlipidemia, unspecified: Secondary | ICD-10-CM

## 2014-11-29 DIAGNOSIS — K625 Hemorrhage of anus and rectum: Secondary | ICD-10-CM

## 2014-11-29 LAB — GLUCOSE, CAPILLARY
GLUCOSE-CAPILLARY: 207 mg/dL — AB (ref 65–99)
GLUCOSE-CAPILLARY: 134 mg/dL — AB (ref 65–99)
Glucose-Capillary: 115 mg/dL — ABNORMAL HIGH (ref 65–99)
Glucose-Capillary: 202 mg/dL — ABNORMAL HIGH (ref 65–99)

## 2014-11-29 LAB — HEMOGLOBIN A1C
HEMOGLOBIN A1C: 6.9 % — AB (ref 4.8–5.6)
MEAN PLASMA GLUCOSE: 151 mg/dL

## 2014-11-29 MED ORDER — ATORVASTATIN CALCIUM 40 MG PO TABS
40.0000 mg | ORAL_TABLET | Freq: Every day | ORAL | Status: DC
  Administered 2014-11-29: 40 mg via ORAL
  Filled 2014-11-29: qty 1

## 2014-11-29 MED ORDER — PANTOPRAZOLE SODIUM 40 MG PO TBEC
40.0000 mg | DELAYED_RELEASE_TABLET | Freq: Every day | ORAL | Status: DC
  Administered 2014-11-29: 40 mg via ORAL
  Filled 2014-11-29: qty 1

## 2014-11-29 MED ORDER — TAB-A-VITE/IRON PO TABS
1.0000 | ORAL_TABLET | Freq: Every day | ORAL | Status: DC
  Administered 2014-11-29 – 2014-11-30 (×2): 1 via ORAL
  Filled 2014-11-29 (×2): qty 1

## 2014-11-29 MED ORDER — SODIUM CHLORIDE 0.9 % IV SOLN
INTRAVENOUS | Status: DC
  Administered 2014-11-29: 12:00:00 via INTRAVENOUS

## 2014-11-29 NOTE — Progress Notes (Signed)
     [facility] Progress Note  Subjective: Admittted 10/5 with LGIB, likely diverticular.. Hgb this morning 10.3.Has not had any BRBPR. No abd pain. No N/V.   Objective:  Vital signs in last 24 hours: Temp:  [98.1 F (36.7 C)-98.7 F (37.1 C)] 98.2 F (36.8 C) (10/06 0425) Pulse Rate:  [84-98] 92 (10/06 0200) Resp:  [12-19] 16 (10/06 0200) BP: (127-153)/(40-73) 127/63 mmHg (10/06 0200) SpO2:  [96 %-98 %] 97 % (10/06 0200) Weight:  [205 lb 11 oz (93.3 kg)] 205 lb 11 oz (93.3 kg) (10/06 0500) Last BM Date: 11/27/14 General:   Alert,  Well-developed,    in NAD Heart:  Regular rate and rhythm; no murmurs Pulm;lungs clear Abdomen:  Soft, nontender and nondistended. Normal bowel sounds, without guarding, and without rebound.   Extremities:  Without edema. Neurologic:Alert and  oriented x4;  grossly normal neurologically. Psych: Alert and cooperative. Normal mood and affect.  Intake/Output from previous day: 10/05 0701 - 10/06 0700 In: 2677.1 [P.O.:150; I.V.:2527.1] Out: 1 [Stool:1] Intake/Output this shift:    Lab Results:  Recent Labs  11/28/14 0729 11/28/14 1117 11/28/14 1525  WBC 7.7 8.1 7.3  HGB 10.7* 11.3* 10.3*  HCT 31.8* 33.2* 30.7*  PLT 167 174 158   BMET  Recent Labs  11/27/14 1814 11/28/14 0345  NA 139 139  K 4.3 3.7  CL 105 107  CO2 27 25  GLUCOSE 191* 149*  BUN 20 16  CREATININE 1.10 0.91  CALCIUM 9.6 8.3*   LFT  Recent Labs  11/28/14 0345  PROT 5.9*  ALBUMIN 3.7  AST 22  ALT 30  ALKPHOS 96  BILITOT 0.9     ASSESSMENT/PLAN:   69 yo male s/p admission for LGIB. Has not had further bleeding. Will give trial of soft diet. Monitor H/H. Hold ASA/Heparin products.    LOS: 2 days   Latonya Nelon, Vita Barley PA-C 11/29/2014, Pager [phone]

## 2014-11-29 NOTE — Progress Notes (Signed)
Patient reports no bloody stool tonight. NAD denies needs or wants.

## 2014-11-29 NOTE — Progress Notes (Signed)
[facility] PROGRESS NOTE  Taylor Torres RWE:[id number] DOB: [birth year] DOA: 11/27/2014 PCP: Scarlette Calico, MD  Assessment/Plan: 1-BRBPR: patient with acute lower GIB -most likely diverticular -improving/resolving  -will monitor Hgb trend and any other signs of acute bleeding -if ok with GI will advance diet to low residue -bowel regimen with miralax -continue holding ASA/heparin products  2-HLD: will continue lipitor  3-acute blood loss anemia: due to problem #1 -will continue monitoring Hgb trend -no need for transfusion currently -continue MV with iron   4-GERD: will continue PPI  5-hx of BPH: stable and not on medications at home -denies urinary retention symptoms   Code Status: Full Family Communication: no family at bedside Disposition Plan: will transfer out of Stepdown, monitor for another 24 hours and will advance diet further if ok with GI.   Consultants:  GI  Procedures:  See below for x-ray reports   Antibiotics:  None   HPI/Subjective: Afebrile, no CP, no SOB. Patient w/o any further episode of BRBPR and deneis any abd pain, N/V or any acute distress  Objective: Filed Vitals:   11/29/14 0425  BP:   Pulse:   Temp: 98.2 F (36.8 C)  Resp:     Intake/Output Summary (Last 24 hours) at 11/29/14 0843 Last data filed at 11/29/14 0300  Gross per 24 hour  Intake   2525 ml  Output      1 ml  Net   2524 ml   Filed Weights   11/28/14 0255 11/29/14 0500  Weight: 93.1 kg (205 lb 4 oz) 93.3 kg (205 lb 11 oz)    Exam:   General:  Afebrile, denies CP and SOB. Reports no abd pain and denies any further episodes of BRBPR  Cardiovascular: S1 and S2, no rubs or gallops, no murmurs   Respiratory: CTA bilaterally  Abdomen: soft, NT, ND, positive BS  Musculoskeletal: no edema or cyanosis   Data Reviewed: Basic Metabolic Panel:  Recent Labs Lab 11/27/14 1814 11/28/14 0345  NA 139 139  K 4.3 3.7  CL 105 107  CO2 27 25  GLUCOSE 191*  149*  BUN 20 16  CREATININE 1.10 0.91  CALCIUM 9.6 8.3*   Liver Function Tests:  Recent Labs Lab 11/27/14 1814 11/28/14 0345  AST 31 22  ALT 37 30  ALKPHOS 59 47  BILITOT 0.5 0.9  PROT 7.6 5.9*  ALBUMIN 4.5 3.7   CBC:  Recent Labs Lab 11/27/14 1814 11/28/14 0304 11/28/14 0729 11/28/14 1117 11/28/14 1525  WBC 12.4* 8.1 7.7 8.1 7.3  HGB 13.9 11.2* 10.7* 11.3* 10.3*  HCT 40.8 32.3* 31.8* 33.2* 30.7*  MCV 91.3 91.8 91.4 92.0 90.8  PLT 200 164 167 174 158   CBG:  Recent Labs Lab 11/28/14 0641 11/28/14 1801  GLUCAP 132* 106*    Recent Results (from the past 240 hour(s))  MRSA PCR Screening     Status: None   Collection Time: 11/28/14  3:01 AM  Result Value Ref Range Status   MRSA by PCR NEGATIVE NEGATIVE Final    Comment:        The GeneXpert MRSA Assay (FDA approved for NASAL specimens only), is one component of a comprehensive MRSA colonization surveillance program. It is not intended to diagnose MRSA infection nor to guide or monitor treatment for MRSA infections.      Studies: No results found.  Scheduled Meds: . atorvastatin  40 mg Oral q1800  . multivitamins with iron  1 tablet Oral Daily  . pantoprazole  40 mg Oral Q1200   Continuous Infusions:   Principal Problem:   Rectal bleeding Active Problems:   Hyperlipidemia with target LDL less than 130    Time spent: 30 minutes    Barton Dubois  [facility] Pager [phone] if 7PM-7AM, please contact night-coverage at [email or web address], password [facility] 11/29/2014, 8:43 AM  LOS: 2 days

## 2014-11-30 DIAGNOSIS — Z87438 Personal history of other diseases of male genital organs: Secondary | ICD-10-CM

## 2014-11-30 DIAGNOSIS — K219 Gastro-esophageal reflux disease without esophagitis: Secondary | ICD-10-CM | POA: Insufficient documentation

## 2014-11-30 LAB — CBC
HEMATOCRIT: 29.9 % — AB (ref 39.0–52.0)
HEMOGLOBIN: 10 g/dL — AB (ref 13.0–17.0)
MCH: 30.9 pg (ref 26.0–34.0)
MCHC: 33.4 g/dL (ref 30.0–36.0)
MCV: 92.3 fL (ref 78.0–100.0)
Platelets: 163 10*3/uL (ref 150–400)
RBC: 3.24 MIL/uL — AB (ref 4.22–5.81)
RDW: 13 % (ref 11.5–15.5)
WBC: 6.7 10*3/uL (ref 4.0–10.5)

## 2014-11-30 LAB — BASIC METABOLIC PANEL
Anion gap: 4 — ABNORMAL LOW (ref 5–15)
BUN: 11 mg/dL (ref 6–20)
CHLORIDE: 109 mmol/L (ref 101–111)
CO2: 26 mmol/L (ref 22–32)
Calcium: 8.6 mg/dL — ABNORMAL LOW (ref 8.9–10.3)
Creatinine, Ser: 1.01 mg/dL (ref 0.61–1.24)
Glucose, Bld: 139 mg/dL — ABNORMAL HIGH (ref 65–99)
POTASSIUM: 3.6 mmol/L (ref 3.5–5.1)
SODIUM: 139 mmol/L (ref 135–145)

## 2014-11-30 LAB — GLUCOSE, CAPILLARY: GLUCOSE-CAPILLARY: 134 mg/dL — AB (ref 65–99)

## 2014-11-30 MED ORDER — ASPIRIN 325 MG PO TABS: 325.0000 mg | ORAL_TABLET | Freq: Every day | ORAL | Status: DC

## 2014-11-30 MED ORDER — PANTOPRAZOLE SODIUM 40 MG PO TBEC: 40.0000 mg | DELAYED_RELEASE_TABLET | Freq: Every day | ORAL | Status: DC

## 2014-11-30 NOTE — Progress Notes (Signed)
     [facility] Progress Note  Subjective:  Hgb 10. VSS. Feels well.No BRBPR or melena. Tol soft diet.    Objective:  Vital signs in last 24 hours: Temp:  [97.9 F (36.6 C)] 97.9 F (36.6 C) (10/07 0531) Pulse Rate:  [82-91] 88 (10/07 0531) Resp:  [16-22] 16 (10/07 0531) BP: (111-154)/(52-69) 111/52 mmHg (10/07 0531) SpO2:  [96 %-99 %] 96 % (10/07 0531) Weight:  [205 lb 9.6 oz (93.26 kg)] 205 lb 9.6 oz (93.26 kg) (10/07 0531) Last BM Date: 11/29/14 General:   Alert,  Well-developed, in NAD Heart:  Regular rate and rhythm; no murmurs Pulm;lungs clear Abdomen:  Soft, nontender and nondistended. Normal bowel sounds, without guarding, and without rebound.   Extremities:  Without edema. Neurologic: Alert and  oriented x4;  grossly normal neurologically. Psych: Alert and cooperative. Normal mood and affect.  Intake/Output from previous day: 10/06 0701 - 10/07 0700 In: 7893 [P.O.:880; I.V.:850] Out: -  Intake/Output this shift:    Lab Results:  Recent Labs  11/28/14 1117 11/28/14 1525 11/30/14 0442  WBC 8.1 7.3 6.7  HGB 11.3* 10.3* 10.0*  HCT 33.2* 30.7* 29.9*  PLT 174 158 163   BMET  Recent Labs  11/27/14 1814 11/28/14 0345 11/30/14 0442  NA 139 139 139  K 4.3 3.7 3.6  CL 105 107 109  CO2 27 25 26   GLUCOSE 191* 149* 139*  BUN 20 16 11   CREATININE 1.10 0.91 1.01  CALCIUM 9.6 8.3* 8.6*   LFT  Recent Labs  11/28/14 0345  PROT 5.9*  ALBUMIN 3.7  AST 22  ALT 30  ALKPHOS 52  BILITOT 0.9     ASSESSMENT/PLAN:   69 yo male s/p admission for LGIB. Has not had further bleeding.Can likely go home on soft diet for a few days, advance as tolerated. Hold ASA for 2 weeks.Will recheck Hgb in 1 week.    LOS: 3 days   Nataleah Scioneaux, Vita Barley PA-C 11/30/2014, Pager [phone]

## 2014-11-30 NOTE — Discharge Summary (Signed)
Physician Discharge Summary  Taylor Torres PVV:[id number] DOB: 06/29/1945 DOA: 11/27/2014  PCP: Scarlette Calico, MD  Admit date: 11/27/2014 Discharge date: 11/30/2014  Time spent: 35 minutes  Recommendations for Outpatient Follow-up:  Repeat CBC to follow Hgb trend Check BMET to follow electrolytes and renal function  Discharge Diagnoses:  Principal Problem:   Rectal bleeding Active Problems:   Hyperlipidemia with target LDL less than 130   Esophageal reflux   History of BPH   Discharge Condition: stable and improved. Will discharge home with follow up in 10 days with PCP.  Diet recommendation: heart healthy diet, low residues   Filed Weights   11/28/14 0255 11/29/14 0500 11/30/14 0531  Weight: 93.1 kg (205 lb 4 oz) 93.3 kg (205 lb 11 oz) 93.26 kg (205 lb 9.6 oz)    History of present illness:  69 y.o. male with history of hyperlipidemia presents to the ER because of rectal bleeding. Patient had at least 3 episodes of rectal bleeding at home prior to arrival. Patient's first episode started around midnight yesterday. Patient has benign frank rectal bleeding. Had some mild dizziness but denies any syncope chest pain or shortness of breath. In the ER patient had 2 more episodes of frank rectal bleeding. Denies any abdominal pain nausea vomiting. Patient has been admitted for further management of rectal bleeding. In February of this year patient had EGD which was unremarkable. Patient had colonoscopy in 2011 which showed severe diverticulosis. Patient is also on aspirin for prophylaxis. Denies any NSAID use.   Hospital Course:  1-BRBPR: patient with acute lower GIB -most likely diverticular -no need for colonoscopy -no further active bleeding at discharge -advise to follow soft low residue diet and to maintain adequate hydration  -bowel regimen with miralax as needed discussed with patient  -continue holding ASA; per GI ok to resume in 2 weeks  2-HLD: will continue lipitor -advise  to follow heart healthy diet   3-acute blood loss anemia: due to problem #1 -will recommend close outpatient follow up of Hgb trend -no need for transfusion during this admission  -continue MV with iron  -Hgb at discharge 10.0  4-GERD: will continue PPI  5-hx of BPH: stable and not on medications at home -denies urinary retention symptoms  -continue outpatient follow up  Procedures:  None   Consultations:  GI  Discharge Exam: Filed Vitals:   11/30/14 0531  BP: 111/52  Pulse: 88  Temp: 97.9 F (36.6 C)  Resp: 16    General: Afebrile, denies CP and SOB. Reports no abd pain and denies any further significant episodes of BRBPR  Cardiovascular: S1 and S2, no rubs or gallops, no murmurs   Respiratory: CTA bilaterally  Abdomen: soft, NT, ND, positive BS  Musculoskeletal: no edema or cyanosis   Discharge Instructions   Discharge Instructions    Diet - low sodium heart healthy    Complete by:  As directed      Discharge instructions    Complete by:  As directed   TAKE MEDICATIONS AS PRESCRIBED MAINTAIN ADEQUATE HYDRATION FOLLOW A LOW RESIDUE DIET AVOID THE USE OF NSAID'S IN GENERAL  OK TO RESUME ASPIRIN IN 2 WEEKS (12/14/14)          Current Discharge Medication List    START taking these medications   Details  pantoprazole (PROTONIX) 40 MG tablet Take 1 tablet (40 mg total) by mouth daily at 12 noon. Qty: 30 tablet, Refills: 0      CONTINUE these medications which have  CHANGED   Details  aspirin 325 MG tablet Take 1 tablet (325 mg total) by mouth daily. HOLD FOR 2 WEEKS      CONTINUE these medications which have NOT CHANGED   Details  Amino Acids (AMINO ACID PO) Take 1 capsule by mouth daily.     atorvastatin (LIPITOR) 40 MG tablet Take 1 tablet (40 mg total) by mouth daily. Qty: 90 tablet, Refills: 3   Associated Diagnoses: Other and unspecified hyperlipidemia    B Complex-Biotin-FA (B-COMPLEX PO) Take 1 tablet by mouth daily.     Associated  Diagnoses: Routine general medical examination at a health care facility; Routine general medical examination at a health care facility    CINNAMON PO Take 1 capsule by mouth daily.    Digestive Enzymes (MULTI-ENZYME PO) Take 1 tablet by mouth daily.     Associated Diagnoses: Routine general medical examination at a health care facility; Routine general medical examination at a health care facility    MAGNESIUM-ZINC PO Take 1 tablet by mouth daily.    Multiple Vitamins-Minerals (MULTIVITAMIN WITH MINERALS) tablet Take 1 tablet by mouth daily.     Associated Diagnoses: Routine general medical examination at a health care facility; Routine general medical examination at a health care facility    Omega 3-6-9 Fatty Acids (TRIPLE OMEGA-3-6-9) CAPS Take 1 capsule by mouth daily.       STOP taking these medications     hydrocortisone (ANUSOL-HC) 2.5 % rectal cream      hydrocortisone (ANUSOL-HC) 25 MG suppository        No Known Allergies Follow-up Information    Follow up with Scarlette Calico, MD. Schedule an appointment as soon as possible for a visit in 10 days.   Specialty:  Internal Medicine   Contact information:   [address] [address] [phone]       The results of significant diagnostics from this hospitalization (including imaging, microbiology, ancillary and laboratory) are listed below for reference.    Significant Diagnostic Studies: No results found.  Microbiology: Recent Results (from the past 240 hour(s))  MRSA PCR Screening     Status: None   Collection Time: 11/28/14  3:01 AM  Result Value Ref Range Status   MRSA by PCR NEGATIVE NEGATIVE Final    Comment:        The GeneXpert MRSA Assay (FDA approved for NASAL specimens only), is one component of a comprehensive MRSA colonization surveillance program. It is not intended to diagnose MRSA infection nor to guide or monitor treatment for MRSA infections.      Labs: Basic  Metabolic Panel:  Recent Labs Lab 11/27/14 1814 11/28/14 0345 11/30/14 0442  NA 139 139 139  K 4.3 3.7 3.6  CL 105 107 109  CO2 27 25 26   GLUCOSE 191* 149* 139*  BUN 20 16 11   CREATININE 1.10 0.91 1.01  CALCIUM 9.6 8.3* 8.6*   Liver Function Tests:  Recent Labs Lab 11/27/14 1814 11/28/14 0345  AST 31 22  ALT 37 30  ALKPHOS 59 47  BILITOT 0.5 0.9  PROT 7.6 5.9*  ALBUMIN 4.5 3.7   CBC:  Recent Labs Lab 11/28/14 0304 11/28/14 0729 11/28/14 1117 11/28/14 1525 11/30/14 0442  WBC 8.1 7.7 8.1 7.3 6.7  HGB 11.2* 10.7* 11.3* 10.3* 10.0*  HCT 32.3* 31.8* 33.2* 30.7* 29.9*  MCV 91.8 91.4 92.0 90.8 92.3  PLT 164 167 174 158 163   CBG:  Recent Labs Lab 11/28/14 1801 11/29/14  0001 11/29/14 1151 11/29/14 2123 11/30/14 0534  GLUCAP 106* 115* 202* 134* 134*    Signed:  Barton Dubois  [facility] 11/30/2014, 10:03 AM

## 2014-11-30 NOTE — Care Management Important Message (Signed)
Important Message  Patient Details  Name: Taylor Torres MRN: [id number] Date of Birth: 1945/06/16   Medicare Important Message Given:  [facility] notification given    Camillo Flaming 11/30/2014, 11:09 AMImportant Message  Patient Details  Name: Taylor Torres MRN: [id number] Date of Birth: Nov 11, 1945   Medicare Important Message Given:  Yes-second notification given    Camillo Flaming 11/30/2014, 11:09 AM

## 2014-12-03 ENCOUNTER — Other Ambulatory Visit: Payer: Self-pay

## 2014-12-03 ENCOUNTER — Telehealth: Payer: Self-pay

## 2014-12-03 DIAGNOSIS — Z8719 Personal history of other diseases of the digestive system: Secondary | ICD-10-CM

## 2014-12-03 NOTE — Telephone Encounter (Signed)
Pt aware and order in epic. 

## 2014-12-03 NOTE — Telephone Encounter (Signed)
-----   Message from Vita Barley Hvozdovic, PA-C sent at 11/30/2014  9:33 AM EDT ----- This pt was admitted for diverticular bleed and is going home today. Can you please have him go for a cbc in 7-10 days? Thank you

## 2014-12-03 NOTE — Telephone Encounter (Signed)
Pt was admitted to the hospital

## 2014-12-06 ENCOUNTER — Ambulatory Visit (INDEPENDENT_AMBULATORY_CARE_PROVIDER_SITE_OTHER): Payer: Medicare Other | Admitting: Internal Medicine

## 2014-12-06 ENCOUNTER — Encounter: Payer: Self-pay | Admitting: Internal Medicine

## 2014-12-06 VITALS — BP 138/74 | HR 76 | Temp 97.7°F | Resp 16 | Ht 69.0 in | Wt 202.0 lb

## 2014-12-06 DIAGNOSIS — K219 Gastro-esophageal reflux disease without esophagitis: Secondary | ICD-10-CM

## 2014-12-06 DIAGNOSIS — E118 Type 2 diabetes mellitus with unspecified complications: Secondary | ICD-10-CM

## 2014-12-06 DIAGNOSIS — Z23 Encounter for immunization: Secondary | ICD-10-CM

## 2014-12-06 DIAGNOSIS — D510 Vitamin B12 deficiency anemia due to intrinsic factor deficiency: Secondary | ICD-10-CM | POA: Diagnosis not present

## 2014-12-06 LAB — CBC AND DIFFERENTIAL
HEMATOCRIT: 37 % — AB (ref 41–53)
HEMOGLOBIN: 12.4 g/dL — AB (ref 13.5–17.5)
NEUTROS ABS: 4 /uL
PLATELETS: 263 10*3/uL (ref 150–399)
WBC: 7.3 10*3/mL

## 2014-12-06 NOTE — Progress Notes (Signed)
Pre visit review using our clinic review tool, if applicable. No additional management support is needed unless otherwise documented below in the visit note. 

## 2014-12-06 NOTE — Progress Notes (Signed)
Subjective:  Patient ID: Taylor Torres, male    DOB: 1945/08/13  Age: 69 y.o. MRN: [id number]  CC: Anemia   HPI Taylor Torres presents for a f/up after a recent admission for LGI bleed, he did not get a transfusion though the bleeding was initially severe and his H and H dropped down quite a bit. He felt lightheaded after a few episodes of severe bleeding but that has resolved. The bleeding stopped on its own and he did not have any procedures performed. His A1C was found to be 6.9% during the admission as well.  Outpatient Prescriptions Prior to Visit  Medication Sig Dispense Refill  . Amino Acids (AMINO ACID PO) Take 1 capsule by mouth daily.     Marland Kitchen aspirin 325 MG tablet Take 1 tablet (325 mg total) by mouth daily. HOLD FOR 2 WEEKS    . atorvastatin (LIPITOR) 40 MG tablet Take 1 tablet (40 mg total) by mouth daily. 90 tablet 3  . B Complex-Biotin-FA (B-COMPLEX PO) Take 1 tablet by mouth daily.      Marland Kitchen CINNAMON PO Take 1 capsule by mouth daily.    . Digestive Enzymes (MULTI-ENZYME PO) Take 1 tablet by mouth daily.      Marland Kitchen MAGNESIUM-ZINC PO Take 1 tablet by mouth daily.    . Multiple Vitamins-Minerals (MULTIVITAMIN WITH MINERALS) tablet Take 1 tablet by mouth daily.      . Omega 3-6-9 Fatty Acids (TRIPLE OMEGA-3-6-9) CAPS Take 1 capsule by mouth daily.     . pantoprazole (PROTONIX) 40 MG tablet Take 1 tablet (40 mg total) by mouth daily at 12 noon. 30 tablet 0   No facility-administered medications prior to visit.    ROS Review of Systems  Constitutional: Negative.  Negative for fever, chills, diaphoresis, appetite change and fatigue.  HENT: Negative.  Negative for trouble swallowing and voice change.   Eyes: Negative.   Respiratory: Negative.   Cardiovascular: Negative.  Negative for chest pain, palpitations and leg swelling.  Gastrointestinal: Negative.  Negative for nausea, vomiting, abdominal pain, diarrhea, constipation, blood in stool, anal bleeding and rectal pain.  Endocrine:  Negative.   Genitourinary: Negative.   Musculoskeletal: Negative.  Negative for myalgias, back pain, joint swelling and arthralgias.  Skin: Negative.  Negative for color change, pallor and rash.  Neurological: Negative.  Negative for dizziness, syncope, speech difficulty, weakness, light-headedness, numbness and headaches.  Hematological: Negative.  Negative for adenopathy. Does not bruise/bleed easily.  Psychiatric/Behavioral: Negative.     Objective:  Ht 5\' 9"  (1.753 m)  Wt 202 lb (91.627 kg)  BMI 29.82 kg/m2  BP Readings from Last 3 Encounters:  11/30/14 111/52  04/05/14 126/76  03/29/14 150/70    Wt Readings from Last 3 Encounters:  12/06/14 202 lb (91.627 kg)  11/30/14 205 lb 9.6 oz (93.26 kg)  04/05/14 207 lb (93.895 kg)    Physical Exam  Constitutional: He is oriented to person, place, and time. No distress.  HENT:  Head: Normocephalic and atraumatic.  Mouth/Throat: Oropharynx is clear and moist. No oropharyngeal exudate.  Eyes: Conjunctivae are normal. Right eye exhibits no discharge. Left eye exhibits no discharge. No scleral icterus.  Neck: Normal range of motion. Neck supple. No JVD present. No tracheal deviation present. No thyromegaly present.  Cardiovascular: Normal rate, regular rhythm, normal heart sounds and intact distal pulses.  Exam reveals no gallop and no friction rub.   No murmur heard. Pulmonary/Chest: Effort normal and breath sounds normal. No stridor. No respiratory distress.  He has no wheezes. He has no rales. He exhibits no tenderness.  Abdominal: Soft. Bowel sounds are normal. He exhibits no distension and no mass. There is no tenderness. There is no rebound and no guarding.  Musculoskeletal: Normal range of motion. He exhibits no edema or tenderness.  Lymphadenopathy:    He has no cervical adenopathy.  Neurological: He is oriented to person, place, and time.  Skin: Skin is warm and dry. No rash noted. He is not diaphoretic. No erythema. No  pallor.  Psychiatric: He has a normal mood and affect. His behavior is normal. Judgment and thought content normal.  Vitals reviewed.   Lab Results  Component Value Date   WBC 6.7 11/30/2014   HGB 10.0* 11/30/2014   HCT 29.9* 11/30/2014   PLT 163 11/30/2014   GLUCOSE 139* 11/30/2014   CHOL 123 01/17/2013   TRIG 144 01/17/2013   HDL 32* 01/17/2013   LDLCALC 62 01/17/2013   ALT 30 11/28/2014   AST 22 11/28/2014   NA 139 11/30/2014   K 3.6 11/30/2014   CL 109 11/30/2014   CREATININE 1.01 11/30/2014   BUN 11 11/30/2014   CO2 26 11/30/2014   TSH 1.53 01/17/2013   PSA 0.5 01/23/2013   HGBA1C 6.9* 11/28/2014    No results found.  Assessment & Plan:   Taylor Torres was seen today for anemia.  Diagnoses and all orders for this visit:  Type 2 diabetes mellitus with complication, without long-term current use of insulin ([place])- he does not need a medication for this, he will work on his lifestyle modifications with diet, exercise, weight loss. -     Ambulatory referral to Ophthalmology  Gastroesophageal reflux disease without esophagitis- this is being treated with a proton pump inhibitor.  Pernicious anemia- a CBC has been ordered but he has benefits with [facility] that will allow him to have free labs done there, therefore he is refusing to do the CBC that has been ordered here. Instead he is insisting that his labs be done at [facility] so that order has been printed for him to take to [facility] and I await the results. -     CBC with Differential/Platelet; Future -     IBC panel; Future -     Ferritin; Future  Need for vaccination with 13-polyvalent pneumococcal conjugate vaccine -     Cancel: Pneumococcal polysaccharide vaccine 23-valent greater than or equal to 2yo subcutaneous/IM -     Pneumococcal conjugate vaccine 13-valent  I have discontinued [name] and PROCTOZONE-HC. I am also having him maintain his TRIPLE OMEGA-3-6-9, Digestive Enzymes (MULTI-ENZYME PO), B  Complex-Biotin-FA (B-COMPLEX PO), multivitamin with minerals, Amino Acids (AMINO ACID PO), atorvastatin, MAGNESIUM-ZINC PO, CINNAMON PO, aspirin, and pantoprazole.  Meds ordered this encounter  Medications  . DISCONTD: ANUCORT-HC 25 MG suppository    Sig: INSERT 1 SUP REC QHS FOR 7 DAYS    Refill:  0  . DISCONTD: PROCTOZONE-HC 2.5 % rectal cream    Sig: PLACE 1 APPLICATION RECTALLY BID    Refill:  0     Follow-up: Return in about 6 weeks (around 01/17/2015).  Scarlette Calico, MD

## 2014-12-06 NOTE — Patient Instructions (Signed)
Anemia, Nonspecific Anemia is a condition in which the concentration of red blood cells or hemoglobin in the blood is below normal. Hemoglobin is a substance in red blood cells that carries oxygen to the tissues of the body. Anemia results in not enough oxygen reaching these tissues.  CAUSES  Common causes of anemia include:   Excessive bleeding. Bleeding may be internal or external. This includes excessive bleeding from periods (in women) or from the intestine.   Poor nutrition.   Chronic kidney, thyroid, and liver disease.  Bone marrow disorders that decrease red blood cell production.  Cancer and treatments for cancer.  HIV, AIDS, and their treatments.  Spleen problems that increase red blood cell destruction.  Blood disorders.  Excess destruction of red blood cells due to infection, medicines, and autoimmune disorders. SIGNS AND SYMPTOMS   Minor weakness.   Dizziness.   Headache.  Palpitations.   Shortness of breath, especially with exercise.   Paleness.  Cold sensitivity.  Indigestion.  Nausea.  Difficulty sleeping.  Difficulty concentrating. Symptoms may occur suddenly or they may develop slowly.  DIAGNOSIS  Additional blood tests are often needed. These help your health care provider determine the best treatment. Your health care provider will check your stool for blood and look for other causes of blood loss.  TREATMENT  Treatment varies depending on the cause of the anemia. Treatment can include:   Supplements of iron, vitamin B12, or folic acid.   Hormone medicines.   A blood transfusion. This may be needed if blood loss is severe.   Hospitalization. This may be needed if there is significant continual blood loss.   Dietary changes.  Spleen removal. HOME CARE INSTRUCTIONS Keep all follow-up appointments. It often takes many weeks to correct anemia, and having your health care provider check on your condition and your response to  treatment is very important. SEEK IMMEDIATE MEDICAL CARE IF:   You develop extreme weakness, shortness of breath, or chest pain.   You become dizzy or have trouble concentrating.  You develop heavy vaginal bleeding.   You develop a rash.   You have bloody or black, tarry stools.   You faint.   You vomit up blood.   You vomit repeatedly.   You have abdominal pain.  You have a fever or persistent symptoms for more than 2-3 days.   You have a fever and your symptoms suddenly get worse.   You are dehydrated.  MAKE SURE YOU:  Understand these instructions.  Will watch your condition.  Will get help right away if you are not doing well or get worse.   This information is not intended to replace advice given to you by your health care provider. Make sure you discuss any questions you have with your health care provider.   Document Released: 03/19/2004 Document Revised: 10/12/2012 Document Reviewed: 08/05/2012 Elsevier Interactive Patient Education 2016 [employer]  

## 2014-12-09 ENCOUNTER — Encounter: Payer: Self-pay | Admitting: Internal Medicine

## 2014-12-21 ENCOUNTER — Other Ambulatory Visit: Payer: Self-pay | Admitting: Internal Medicine

## 2014-12-21 LAB — CBC WITH DIFFERENTIAL/PLATELET
BASO: 0 %
EOS (ABSOLUTE VALUE): 0.3
LYMPHO ABS: 3 /uL
MONO ABS: 0 /uL
Neutrophils Absolute: 4 /uL

## 2014-12-21 LAB — FERRITIN, SERUM (SERIAL): Ferritin: 91

## 2014-12-21 LAB — IRON AND TIBC
IRON BIND. CAP.(TOTAL): 334
Iron Saturation: 16
Iron: 54
UIBC: 280

## 2014-12-22 ENCOUNTER — Encounter: Payer: Self-pay | Admitting: Internal Medicine

## 2015-01-22 ENCOUNTER — Ambulatory Visit (INDEPENDENT_AMBULATORY_CARE_PROVIDER_SITE_OTHER): Payer: Medicare Other | Admitting: Internal Medicine

## 2015-01-22 ENCOUNTER — Encounter: Payer: Self-pay | Admitting: Internal Medicine

## 2015-01-22 VITALS — BP 128/64 | HR 88 | Temp 98.3°F | Resp 16 | Ht 69.0 in | Wt 202.0 lb

## 2015-01-22 DIAGNOSIS — E785 Hyperlipidemia, unspecified: Secondary | ICD-10-CM | POA: Diagnosis not present

## 2015-01-22 DIAGNOSIS — E118 Type 2 diabetes mellitus with unspecified complications: Secondary | ICD-10-CM | POA: Diagnosis not present

## 2015-01-22 DIAGNOSIS — D510 Vitamin B12 deficiency anemia due to intrinsic factor deficiency: Secondary | ICD-10-CM

## 2015-01-22 DIAGNOSIS — Z0000 Encounter for general adult medical examination without abnormal findings: Secondary | ICD-10-CM | POA: Diagnosis not present

## 2015-01-22 DIAGNOSIS — N400 Enlarged prostate without lower urinary tract symptoms: Secondary | ICD-10-CM

## 2015-01-22 LAB — HEPATIC FUNCTION PANEL
ALT: 36 U/L (ref 10–40)
AST: 27 U/L (ref 14–40)
Alkaline Phosphatase: 71 U/L (ref 25–125)
Bilirubin, Total: 0.4 mg/dL

## 2015-01-22 LAB — BASIC METABOLIC PANEL
BUN: 14 mg/dL (ref 4–21)
Creatinine: 1.1 mg/dL (ref 0.6–1.3)
POTASSIUM: 4.8 mmol/L (ref 3.4–5.3)
Sodium: 144 mmol/L (ref 137–147)

## 2015-01-22 LAB — CBC AND DIFFERENTIAL
HCT: 44 % (ref 41–53)
HEMOGLOBIN: 14.4 g/dL (ref 13.5–17.5)
NEUTROS ABS: 3 /uL
PLATELETS: 179 10*3/uL (ref 150–399)
WBC: 6.1 10^3/mL

## 2015-01-22 LAB — MICROALBUMIN, URINE: MICROALB UR: 54.2

## 2015-01-22 LAB — LIPID PANEL
CHOLESTEROL: 134 mg/dL (ref 0–200)
HDL: 35 mg/dL (ref 35–70)
LDL CALC: 71 mg/dL
Triglycerides: 142 mg/dL (ref 40–160)

## 2015-01-22 LAB — TSH: TSH: 1.33 u[IU]/mL (ref 0.41–5.90)

## 2015-01-22 LAB — HEMOGLOBIN A1C: Hgb A1c MFr Bld: 6.7 % — AB (ref 4.0–6.0)

## 2015-01-22 LAB — FECAL OCCULT BLOOD, GUAIAC: Fecal Occult Blood: NEGATIVE

## 2015-01-22 MED ORDER — ATORVASTATIN CALCIUM 40 MG PO TABS: 40.0000 mg | ORAL_TABLET | Freq: Every day | ORAL | Status: DC

## 2015-01-22 NOTE — Patient Instructions (Signed)

## 2015-01-22 NOTE — Progress Notes (Signed)
Pre visit review using our clinic review tool, if applicable. No additional management support is needed unless otherwise documented below in the visit note. 

## 2015-01-22 NOTE — Progress Notes (Signed)
Subjective:  Patient ID: Taylor Torres, male    DOB: Jul 22, 1945  Age: 69 y.o. MRN: [id number]  CC: Anemia; Annual Exam; and Diabetes   HPI Taylor Torres presents for a CPX - he feels well and offers no complaints.  Outpatient Prescriptions Prior to Visit  Medication Sig Dispense Refill  . Amino Acids (AMINO ACID PO) Take 1 capsule by mouth daily.     Marland Kitchen aspirin 325 MG tablet Take 1 tablet (325 mg total) by mouth daily. HOLD FOR 2 WEEKS    . B Complex-Biotin-FA (B-COMPLEX PO) Take 1 tablet by mouth daily.      Marland Kitchen CINNAMON PO Take 1 capsule by mouth daily.    . Digestive Enzymes (MULTI-ENZYME PO) Take 1 tablet by mouth daily.      Marland Kitchen MAGNESIUM-ZINC PO Take 1 tablet by mouth daily.    . Multiple Vitamins-Minerals (MULTIVITAMIN WITH MINERALS) tablet Take 1 tablet by mouth daily.      . Omega 3-6-9 Fatty Acids (TRIPLE OMEGA-3-6-9) CAPS Take 1 capsule by mouth daily.     Marland Kitchen atorvastatin (LIPITOR) 40 MG tablet Take 1 tablet (40 mg total) by mouth daily. 90 tablet 3  . pantoprazole (PROTONIX) 40 MG tablet Take 1 tablet (40 mg total) by mouth daily at 12 noon. 30 tablet 0   No facility-administered medications prior to visit.    ROS Review of Systems  Constitutional: Negative.  Negative for fever, chills, diaphoresis, appetite change and fatigue.  HENT: Negative.   Eyes: Negative.   Respiratory: Negative.  Negative for cough, choking, chest tightness, shortness of breath and stridor.   Cardiovascular: Negative.  Negative for chest pain, palpitations and leg swelling.  Gastrointestinal: Negative.  Negative for nausea, vomiting, abdominal pain, diarrhea, constipation and blood in stool.  Endocrine: Negative.   Genitourinary: Negative.   Musculoskeletal: Negative.  Negative for myalgias, back pain and arthralgias.  Skin: Negative.  Negative for color change, pallor and rash.  Allergic/Immunologic: Negative.   Neurological: Negative.   Hematological: Negative.  Negative for adenopathy. Does  not bruise/bleed easily.  Psychiatric/Behavioral: Negative.     Objective:  BP 128/64 mmHg  Pulse 88  Temp(Src) 98.3 F (36.8 C) (Oral)  Resp 16  Ht 5\' 9"  (1.753 m)  Wt 202 lb (91.627 kg)  BMI 29.82 kg/m2  SpO2 94%  BP Readings from Last 3 Encounters:  01/22/15 128/64  12/06/14 138/74  11/30/14 111/52    Wt Readings from Last 3 Encounters:  01/22/15 202 lb (91.627 kg)  12/06/14 202 lb (91.627 kg)  11/30/14 205 lb 9.6 oz (93.26 kg)    Physical Exam  Constitutional: He appears well-developed and well-nourished. No distress.  HENT:  Head: Normocephalic and atraumatic.  Mouth/Throat: Oropharynx is clear and moist. No oropharyngeal exudate.  Eyes: Conjunctivae are normal. Right eye exhibits no discharge. Left eye exhibits no discharge. No scleral icterus.  Neck: Normal range of motion. Neck supple. No JVD present. No tracheal deviation present. No thyromegaly present.  Cardiovascular: Normal rate, regular rhythm, normal heart sounds and intact distal pulses.  Exam reveals no gallop and no friction rub.   No murmur heard. Pulmonary/Chest: Effort normal and breath sounds normal. No stridor. No respiratory distress. He has no wheezes. He has no rales. He exhibits no tenderness.  Abdominal: Soft. Bowel sounds are normal. He exhibits no distension and no mass. There is no tenderness. There is no rebound and no guarding. Hernia confirmed negative in the right inguinal area and confirmed negative in  the left inguinal area.  Genitourinary: Rectum normal, testes normal and penis normal. Rectal exam shows no external hemorrhoid, no internal hemorrhoid, no fissure, no mass, no tenderness and anal tone normal. Guaiac negative stool. Prostate is enlarged (1+ smooth symm BPH). Prostate is not tender. Right testis shows no mass, no swelling and no tenderness. Right testis is descended. Left testis shows no mass, no swelling and no tenderness. Left testis is descended. Circumcised. No penile  erythema or penile tenderness. No discharge found.  Lymphadenopathy:    He has no cervical adenopathy.       Right: No inguinal adenopathy present.       Left: No inguinal adenopathy present.  Skin: He is not diaphoretic.  Vitals reviewed.   Lab Results  Component Value Date   WBC 7.3 12/06/2014   HGB 12.4* 12/06/2014   HCT 37* 12/06/2014   PLT 263 12/06/2014   GLUCOSE 139* 11/30/2014   CHOL 123 01/17/2013   TRIG 144 01/17/2013   HDL 32* 01/17/2013   LDLCALC 62 01/17/2013   ALT 30 11/28/2014   AST 22 11/28/2014   NA 139 11/30/2014   K 3.6 11/30/2014   CL 109 11/30/2014   CREATININE 1.01 11/30/2014   BUN 11 11/30/2014   CO2 26 11/30/2014   TSH 1.53 01/17/2013   PSA 0.5 01/23/2013   HGBA1C 6.9* 11/28/2014    No results found.  Assessment & Plan:   Taylor Torres was seen today for anemia, annual exam and diabetes.  Diagnoses and all orders for this visit:  Hyperlipidemia- will recheck his FLP, he is doing well on the statin -     Discontinue: atorvastatin (LIPITOR) 40 MG tablet; Take 1 tablet (40 mg total) by mouth daily. -     Lipid panel -     TSH  BPH (benign prostatic hyperplasia)- he has no symptoms that need to be treated, will screen for prostate cancer with a PSA -     Urinalysis, Routine w reflex microscopic (not at [facility]) -     PSA  Routine general medical examination at a health care facility -     Lipid panel  Pernicious anemia- I will recheck his CBC today and will evaluate further if indicated -     CBC with Differential/Platelet  Hyperlipidemia with target LDL less than 130- he is doing well on the statin -     Comprehensive metabolic panel -     atorvastatin (LIPITOR) 40 MG tablet; Take 1 tablet (40 mg total) by mouth daily.  Type 2 diabetes mellitus with complication, without long-term current use of insulin ([place])- his blood sugars have been well controlled, he is having an eye exam done later this week -     Comprehensive metabolic panel -      Hemoglobin A1c -     Microalbumin / creatinine urine ratio -     atorvastatin (LIPITOR) 40 MG tablet; Take 1 tablet (40 mg total) by mouth daily.   I have discontinued Taylor Torres pantoprazole. I am also having him maintain his TRIPLE OMEGA-3-6-9, Digestive Enzymes (MULTI-ENZYME PO), B Complex-Biotin-FA (B-COMPLEX PO), multivitamin with minerals, Amino Acids (AMINO ACID PO), MAGNESIUM-ZINC PO, CINNAMON PO, aspirin, and atorvastatin.  Meds ordered this encounter  Medications  . DISCONTD: atorvastatin (LIPITOR) 40 MG tablet    Sig: Take 1 tablet (40 mg total) by mouth daily.    Dispense:  90 tablet    Refill:  3  . atorvastatin (LIPITOR) 40 MG tablet  Sig: Take 1 tablet (40 mg total) by mouth daily.    Dispense:  90 tablet    Refill:  3     Follow-up: Return in about 6 months (around 07/22/2015).  Scarlette Calico, MD

## 2015-01-22 NOTE — Assessment & Plan Note (Signed)

## 2015-01-25 DIAGNOSIS — H524 Presbyopia: Secondary | ICD-10-CM | POA: Diagnosis not present

## 2015-01-25 DIAGNOSIS — H2513 Age-related nuclear cataract, bilateral: Secondary | ICD-10-CM | POA: Diagnosis not present

## 2015-01-25 DIAGNOSIS — H43812 Vitreous degeneration, left eye: Secondary | ICD-10-CM | POA: Diagnosis not present

## 2015-01-25 DIAGNOSIS — E119 Type 2 diabetes mellitus without complications: Secondary | ICD-10-CM | POA: Diagnosis not present

## 2015-01-25 LAB — HM DIABETES EYE EXAM

## 2015-01-28 ENCOUNTER — Encounter: Payer: Self-pay | Admitting: Internal Medicine

## 2015-01-29 ENCOUNTER — Encounter: Payer: Self-pay | Admitting: Internal Medicine

## 2015-03-15 DIAGNOSIS — L570 Actinic keratosis: Secondary | ICD-10-CM | POA: Diagnosis not present

## 2015-03-15 DIAGNOSIS — D1801 Hemangioma of skin and subcutaneous tissue: Secondary | ICD-10-CM | POA: Diagnosis not present

## 2015-03-15 DIAGNOSIS — D225 Melanocytic nevi of trunk: Secondary | ICD-10-CM | POA: Diagnosis not present

## 2015-03-15 DIAGNOSIS — L821 Other seborrheic keratosis: Secondary | ICD-10-CM | POA: Diagnosis not present

## 2015-03-15 DIAGNOSIS — L812 Freckles: Secondary | ICD-10-CM | POA: Diagnosis not present

## 2015-04-17 ENCOUNTER — Encounter: Payer: Self-pay | Admitting: Internal Medicine

## 2015-05-03 ENCOUNTER — Encounter: Payer: Self-pay | Admitting: Internal Medicine

## 2015-06-18 ENCOUNTER — Ambulatory Visit (AMBULATORY_SURGERY_CENTER): Payer: Self-pay

## 2015-06-18 VITALS — Ht 69.0 in | Wt 201.4 lb

## 2015-06-18 DIAGNOSIS — Z860100 Personal history of colon polyps, unspecified: Secondary | ICD-10-CM

## 2015-06-18 MED ORDER — SUPREP BOWEL PREP KIT 17.5-3.13-1.6 GM/177ML PO SOLN
1.0000 | Freq: Once | ORAL | Status: DC
Start: 2015-06-18 — End: 2015-07-02

## 2015-06-18 NOTE — Progress Notes (Signed)
No allergies to eggs or soy No past problems with anesthesia No home oxygen No diet meds  Has email and internet; refused emmi 

## 2015-07-02 ENCOUNTER — Encounter: Payer: Self-pay | Admitting: Internal Medicine

## 2015-07-02 ENCOUNTER — Ambulatory Visit (AMBULATORY_SURGERY_CENTER): Payer: Medicare Other | Admitting: Internal Medicine

## 2015-07-02 VITALS — BP 128/78 | HR 77 | Temp 96.9°F | Resp 11 | Ht 69.0 in | Wt 201.0 lb

## 2015-07-02 DIAGNOSIS — Z860100 Personal history of colon polyps, unspecified: Secondary | ICD-10-CM

## 2015-07-02 DIAGNOSIS — N400 Enlarged prostate without lower urinary tract symptoms: Secondary | ICD-10-CM | POA: Diagnosis not present

## 2015-07-02 DIAGNOSIS — Z1211 Encounter for screening for malignant neoplasm of colon: Secondary | ICD-10-CM | POA: Diagnosis not present

## 2015-07-02 LAB — HM COLONOSCOPY

## 2015-07-02 MED ORDER — SODIUM CHLORIDE 0.9 % IV SOLN: 500.0000 mL | INTRAVENOUS | Status: DC

## 2015-07-02 NOTE — Progress Notes (Signed)
To pacu vss patent aw report to rn 

## 2015-07-02 NOTE — Patient Instructions (Addendum)
YOU HAD AN ENDOSCOPIC PROCEDURE TODAY AT THE [facility]:   Refer to the procedure report that was given to you for any specific questions about what was found during the examination.  If the procedure report does not answer your questions, please call your gastroenterologist to clarify.  If you requested that your care partner not be given the details of your procedure findings, then the procedure report has been included in a sealed envelope for you to review at your convenience later.  YOU SHOULD EXPECT: Some feelings of bloating in the abdomen. Passage of more gas than usual.  Walking can help get rid of the air that was put into your GI tract during the procedure and reduce the bloating. If you had a lower endoscopy (such as a colonoscopy or flexible sigmoidoscopy) you may notice spotting of blood in your stool or on the toilet paper. If you underwent a bowel prep for your procedure, you may not have a normal bowel movement for a few days.  Please Note:  You might notice some irritation and congestion in your nose or some drainage.  This is from the oxygen used during your procedure.  There is no need for concern and it should clear up in a day or so.  SYMPTOMS TO REPORT IMMEDIATELY:   Following lower endoscopy (colonoscopy or flexible sigmoidoscopy):  Excessive amounts of blood in the stool  Significant tenderness or worsening of abdominal pains  Swelling of the abdomen that is new, acute  Fever of 100F or higher    For urgent or emergent issues, a gastroenterologist can be reached at any hour by calling [phone].   DIET: Your first meal following the procedure should be a small meal and then it is ok to progress to your normal diet. Heavy or fried foods are harder to digest and may make you feel nauseous or bloated.  Likewise, meals heavy in dairy and vegetables can increase bloating.  Drink plenty of fluids but you should avoid alcoholic beverages for 24  hours.  ACTIVITY:  You should plan to take it easy for the rest of today and you should NOT DRIVE or use heavy machinery until tomorrow (because of the sedation medicines used during the test).    FOLLOW UP: Our staff will call the number listed on your records the next business day following your procedure to check on you and address any questions or concerns that you may have regarding the information given to you following your procedure. If we do not reach you, we will leave a message.  However, if you are feeling well and you are not experiencing any problems, there is no need to return our call.  We will assume that you have returned to your regular daily activities without incident.  If any biopsies were taken you will be contacted by phone or by letter within the next 1-3 weeks.  Please call us at [phone] if you have not heard about the biopsies in 3 weeks.    SIGNATURES/CONFIDENTIALITY: You and/or your care partner have signed paperwork which will be entered into your electronic medical record.  These signatures attest to the fact that that the information above on your After Visit Summary has been reviewed and is understood.  Full responsibility of the confidentiality of this discharge information lies with you and/or your care-partner.   INFORMATION ON DIVERTICULOSIS ,HEMORRHOIDS ,& HIGH FIBER DIET GIVEN TO YOU TODAY 

## 2015-07-02 NOTE — Progress Notes (Signed)
No egg or soy allergy known to patient  No issues with past sedation with any surgeries  or procedures, no intubation problems  No diet pills per patient No home 02 use per patient  No blood thinners per patient  Pt denies issues with constipation   

## 2015-07-02 NOTE — Op Note (Signed)
[facility] Patient Name: Taylor Torres Procedure Date: 07/02/2015 11:05 AM MRN: [id number] Endoscopist: Docia Chuck. Henrene Pastor , MD Age: 70 Date of Birth: November 28, 1945 Gender: Male Procedure:                Colonoscopy Indications:              Surveillance: Personal history of adenomatous                            polyps on last colonoscopy > 5 years ago. Index                            examination 2003 with small tubular adenoma.                            Follow-up examinations 2006 and 2011 without                            neoplasia. Medicines:                Monitored Anesthesia Care Procedure:                Pre-Anesthesia Assessment:                           - Prior to the procedure, a History and Physical                            was performed, and patient medications and                            allergies were reviewed. The patient's tolerance of                            previous anesthesia was also reviewed. The risks                            and benefits of the procedure and the sedation                            options and risks were discussed with the patient.                            All questions were answered, and informed consent                            was obtained. Prior Anticoagulants: The patient has                            taken no previous anticoagulant or antiplatelet                            agents. ASA Grade Assessment: II - A patient with  mild systemic disease. After reviewing the risks                            and benefits, the patient was deemed in                            satisfactory condition to undergo the procedure.                           After obtaining informed consent, the colonoscope                            was passed under direct vision. Throughout the                            procedure, the patient's blood pressure, pulse, and                            oxygen saturations were  monitored continuously. The                            Model CF-HQ190L [id number]) scope was introduced                            through the anus and advanced to the the cecum,                            identified by appendiceal orifice and ileocecal                            valve. The ileocecal valve, appendiceal orifice,                            and rectum were photographed. The quality of the                            bowel preparation was excellent. The colonoscopy                            was performed without difficulty. The patient                            tolerated the procedure well. The bowel preparation                            used was SUPREP. Scope In: 11:21:12 AM Scope Out: 11:31:01 AM Scope Withdrawal Time: 0 hours 7 minutes 32 seconds  Total Procedure Duration: 0 hours 9 minutes 49 seconds  Findings:                 Multiple small and large-mouthed diverticula were                            found in the left colon.  Internal hemorrhoids were found during                            retroflexion. The hemorrhoids were small.                           The exam was otherwise without abnormality on                            direct and retroflexion views. Complications:            No immediate complications. Estimated blood loss:                            None. Estimated Blood Loss:     Estimated blood loss: none. Impression:               - Diverticulosis in the left colon.                           - Internal hemorrhoids.                           - The examination was otherwise normal on direct                            and retroflexion views.                           - No specimens collected. Recommendation:           - Repeat colonoscopy in 10 years for surveillance.                           - Patient has a contact number available for                            emergencies. The signs and symptoms of potential                             delayed complications were discussed with the                            patient. Return to normal activities tomorrow.                            Written discharge instructions were provided to the                            patient.                           - Resume previous diet.                           - Continue present medications. Docia Chuck. Henrene Pastor, MD 07/02/2015 11:37:09 AM This report has been signed electronically. CC Letter to:  Janith Lima, MD

## 2015-07-03 ENCOUNTER — Telehealth: Payer: Self-pay | Admitting: *Deleted

## 2015-07-03 NOTE — Telephone Encounter (Signed)
  Follow up Call-  Call back number 07/02/2015 04/05/2014  Post procedure Call Back phone  # [phone]  Permission to leave phone message Yes Yes     Patient questions:  Do you have a fever, pain , or abdominal swelling? No. Pain Score  0 *  Have you tolerated food without any problems? Yes.    Have you been able to return to your normal activities? Yes.    Do you have any questions about your discharge instructions: Diet   No. Medications  No. Follow up visit  No.  Do you have questions or concerns about your Care? No.  Actions: * If pain score is 4 or above: No action needed, pain <4.

## 2015-07-04 LAB — HM COLONOSCOPY

## 2015-07-07 NOTE — Addendum Note (Signed)
Addended by: Janith Lima on: 07/07/2015 01:33 PM   Modules accepted: Miquel Dunn

## 2015-07-08 NOTE — Addendum Note (Signed)
Addended by: Janith Lima on: 07/08/2015 09:15 AM   Modules accepted: Miquel Dunn

## 2015-12-17 ENCOUNTER — Telehealth: Payer: Self-pay | Admitting: Internal Medicine

## 2015-12-17 NOTE — Telephone Encounter (Signed)
Pt states he started passing some blood from his rectum on Sunday. States some has been bright red and that some of the stool has been dark black in color. Pt scheduled to see Alonza Bogus PA tomorrow at 2:30pm. Pt aware of appt.

## 2015-12-18 ENCOUNTER — Other Ambulatory Visit (INDEPENDENT_AMBULATORY_CARE_PROVIDER_SITE_OTHER): Payer: Medicare Other

## 2015-12-18 ENCOUNTER — Ambulatory Visit (INDEPENDENT_AMBULATORY_CARE_PROVIDER_SITE_OTHER): Payer: Medicare Other | Admitting: Gastroenterology

## 2015-12-18 ENCOUNTER — Encounter: Payer: Self-pay | Admitting: Gastroenterology

## 2015-12-18 VITALS — BP 140/70 | HR 110 | Ht 69.0 in | Wt 189.0 lb

## 2015-12-18 DIAGNOSIS — K625 Hemorrhage of anus and rectum: Secondary | ICD-10-CM | POA: Diagnosis not present

## 2015-12-18 DIAGNOSIS — Z8719 Personal history of other diseases of the digestive system: Secondary | ICD-10-CM | POA: Diagnosis not present

## 2015-12-18 LAB — CBC WITH DIFFERENTIAL/PLATELET
BASOS PCT: 0.3 % (ref 0.0–3.0)
Basophils Absolute: 0 10*3/uL (ref 0.0–0.1)
EOS ABS: 0.1 10*3/uL (ref 0.0–0.7)
EOS PCT: 1 % (ref 0.0–5.0)
HCT: 38.8 % — ABNORMAL LOW (ref 39.0–52.0)
Hemoglobin: 13.4 g/dL (ref 13.0–17.0)
LYMPHS ABS: 1.8 10*3/uL (ref 0.7–4.0)
Lymphocytes Relative: 18.9 % (ref 12.0–46.0)
MCHC: 34.4 g/dL (ref 30.0–36.0)
MCV: 88.7 fl (ref 78.0–100.0)
MONO ABS: 0.6 10*3/uL (ref 0.1–1.0)
Monocytes Relative: 5.7 % (ref 3.0–12.0)
NEUTROS PCT: 74.1 % (ref 43.0–77.0)
Neutro Abs: 7.2 10*3/uL (ref 1.4–7.7)
Platelets: 226 10*3/uL (ref 150.0–400.0)
RBC: 4.38 Mil/uL (ref 4.22–5.81)
RDW: 12.3 % (ref 11.5–15.5)
WBC: 9.7 10*3/uL (ref 4.0–10.5)

## 2015-12-18 NOTE — Patient Instructions (Signed)
Please go to the basement level to have your labs drawn.  

## 2015-12-18 NOTE — Progress Notes (Signed)
     12/18/2015 Taylor Torres [id number] 1946-01-15   History of Present Illness:  This is a pleasant 70 year-old male who is known to Dr. Henrene Pastor. His last colonoscopy was in May 2017 at which time he was found have multiple small and large mouth diverticula in the sigmoid colon as well as internal hemorrhoids.  He has a history of GI bleed suspected to be diverticular in origin in October of last year, 2016. He presents to our office today again with complaints of rectal bleeding. He says that this started on Sunday evening and occurred several times throughout the day on Monday and yesterday. He says that the last that he saw any blood was yesterday evening. He denies any associated abdominal pain. Denies any dizziness or lightheadedness. He showed me pictures of moderate to large amounts of red blood in the toilet bowl.  Current Medications, Allergies, Past Medical History, Past Surgical History, Family History and Social History were reviewed in [employer] record.   Physical Exam: BP 140/70   Pulse (!) 110   Ht 5\' 9"  (1.753 m)   Wt 189 lb (85.7 kg)   BMI 27.91 kg/m  General: Well developed white male in no acute distress Head: Normocephalic and atraumatic Eyes:  Sclerae anicteric, conjunctiva pink  Ears: Normal auditory acuity Lungs: Clear throughout to auscultation Heart: Regular rate and rhythm Abdomen: Soft, non-distended.  Normal bowel sounds.  Non-tender. Musculoskeletal: Symmetrical with no gross deformities  Extremities: No edema  Neurological: Alert oriented x 4, grossly non-focal Psychological:  Alert and cooperative. Normal mood and affect  Assessment and Recommendations: -Rectal bleeding:  History of diverticular bleeding one year ago.  Last colonoscopy in May of this year.  Likely recurrent diverticular bleed.  Will check CBC today.  Last bleeding was yesterday evening.  Will have him remain on full liquid diet tonight and advance to soft/low  residue diet tomorrow if still not further bleeding.  Was told to go to the ED if bleeding recurs.

## 2015-12-19 NOTE — Progress Notes (Signed)
Agree with initial assessment and plans 

## 2016-02-03 ENCOUNTER — Ambulatory Visit (INDEPENDENT_AMBULATORY_CARE_PROVIDER_SITE_OTHER): Payer: Medicare Other | Admitting: Internal Medicine

## 2016-02-03 ENCOUNTER — Encounter: Payer: Self-pay | Admitting: Internal Medicine

## 2016-02-03 ENCOUNTER — Other Ambulatory Visit (INDEPENDENT_AMBULATORY_CARE_PROVIDER_SITE_OTHER): Payer: Medicare Other

## 2016-02-03 VITALS — BP 150/78 | HR 80 | Temp 97.7°F | Resp 16 | Ht 69.0 in | Wt 195.0 lb

## 2016-02-03 DIAGNOSIS — E119 Type 2 diabetes mellitus without complications: Secondary | ICD-10-CM | POA: Diagnosis not present

## 2016-02-03 DIAGNOSIS — D510 Vitamin B12 deficiency anemia due to intrinsic factor deficiency: Secondary | ICD-10-CM

## 2016-02-03 DIAGNOSIS — E785 Hyperlipidemia, unspecified: Secondary | ICD-10-CM

## 2016-02-03 DIAGNOSIS — N400 Enlarged prostate without lower urinary tract symptoms: Secondary | ICD-10-CM

## 2016-02-03 DIAGNOSIS — R7989 Other specified abnormal findings of blood chemistry: Secondary | ICD-10-CM

## 2016-02-03 DIAGNOSIS — Z1159 Encounter for screening for other viral diseases: Secondary | ICD-10-CM | POA: Insufficient documentation

## 2016-02-03 DIAGNOSIS — E118 Type 2 diabetes mellitus with unspecified complications: Secondary | ICD-10-CM | POA: Diagnosis not present

## 2016-02-03 DIAGNOSIS — Z0000 Encounter for general adult medical examination without abnormal findings: Secondary | ICD-10-CM | POA: Diagnosis not present

## 2016-02-03 DIAGNOSIS — H524 Presbyopia: Secondary | ICD-10-CM | POA: Diagnosis not present

## 2016-02-03 DIAGNOSIS — H2513 Age-related nuclear cataract, bilateral: Secondary | ICD-10-CM | POA: Diagnosis not present

## 2016-02-03 DIAGNOSIS — H43813 Vitreous degeneration, bilateral: Secondary | ICD-10-CM | POA: Diagnosis not present

## 2016-02-03 LAB — LIPID PANEL
CHOL/HDL RATIO: 4
Cholesterol: 149 mg/dL (ref 0–200)
HDL: 36.8 mg/dL — AB (ref >39.00)
NONHDL: 111.84
Triglycerides: 221 mg/dL — ABNORMAL HIGH (ref 0.0–149.0)
VLDL: 44.2 mg/dL — ABNORMAL HIGH (ref 0.0–40.0)

## 2016-02-03 LAB — CBC WITH DIFFERENTIAL/PLATELET
BASOS ABS: 0 10*3/uL (ref 0.0–0.1)
Basophils Relative: 0.4 % (ref 0.0–3.0)
EOS ABS: 0.4 10*3/uL (ref 0.0–0.7)
Eosinophils Relative: 5.1 % — ABNORMAL HIGH (ref 0.0–5.0)
HEMATOCRIT: 42.7 % (ref 39.0–52.0)
HEMOGLOBIN: 14.4 g/dL (ref 13.0–17.0)
LYMPHS PCT: 31.9 % (ref 12.0–46.0)
Lymphs Abs: 2.4 10*3/uL (ref 0.7–4.0)
MCHC: 33.6 g/dL (ref 30.0–36.0)
MCV: 88 fl (ref 78.0–100.0)
MONOS PCT: 8.5 % (ref 3.0–12.0)
Monocytes Absolute: 0.6 10*3/uL (ref 0.1–1.0)
NEUTROS ABS: 4 10*3/uL (ref 1.4–7.7)
Neutrophils Relative %: 54.1 % (ref 43.0–77.0)
Platelets: 202 10*3/uL (ref 150.0–400.0)
RBC: 4.86 Mil/uL (ref 4.22–5.81)
RDW: 13 % (ref 11.5–15.5)
WBC: 7.4 10*3/uL (ref 4.0–10.5)

## 2016-02-03 LAB — URINALYSIS, ROUTINE W REFLEX MICROSCOPIC
BILIRUBIN URINE: NEGATIVE
Hgb urine dipstick: NEGATIVE
Ketones, ur: NEGATIVE
LEUKOCYTES UA: NEGATIVE
Nitrite: NEGATIVE
PH: 5.5 (ref 5.0–8.0)
SPECIFIC GRAVITY, URINE: 1.025 (ref 1.000–1.030)
Total Protein, Urine: NEGATIVE
Urine Glucose: >=1000 — AB
Urobilinogen, UA: 0.2 (ref 0.0–1.0)

## 2016-02-03 LAB — LDL CHOLESTEROL, DIRECT: Direct LDL: 87 mg/dL

## 2016-02-03 LAB — HM DIABETES EYE EXAM

## 2016-02-03 LAB — MICROALBUMIN / CREATININE URINE RATIO
CREATININE, U: 99.2 mg/dL
MICROALB/CREAT RATIO: 4.1 mg/g (ref 0.0–30.0)
Microalb, Ur: 4.1 mg/dL — ABNORMAL HIGH (ref 0.0–1.9)

## 2016-02-03 LAB — COMPREHENSIVE METABOLIC PANEL
ALT: 21 U/L (ref 0–53)
AST: 14 U/L (ref 0–37)
Albumin: 4.7 g/dL (ref 3.5–5.2)
Alkaline Phosphatase: 71 U/L (ref 39–117)
BILIRUBIN TOTAL: 0.4 mg/dL (ref 0.2–1.2)
BUN: 18 mg/dL (ref 6–23)
CHLORIDE: 101 meq/L (ref 96–112)
CO2: 27 meq/L (ref 19–32)
CREATININE: 0.97 mg/dL (ref 0.40–1.50)
Calcium: 10.1 mg/dL (ref 8.4–10.5)
GFR: 81.21 mL/min (ref >60.00)
GLUCOSE: 260 mg/dL — AB (ref 70–99)
Potassium: 4 mEq/L (ref 3.5–5.1)
Sodium: 138 mEq/L (ref 135–145)
Total Protein: 7.7 g/dL (ref 6.0–8.3)

## 2016-02-03 LAB — IBC PANEL
Iron: 74 ug/dL (ref 42–165)
SATURATION RATIOS: 15.8 % — AB (ref 20.0–50.0)
TRANSFERRIN: 335 mg/dL (ref 212.0–360.0)

## 2016-02-03 LAB — HEMOGLOBIN A1C: Hgb A1c MFr Bld: 8.3 % — ABNORMAL HIGH (ref 4.6–6.5)

## 2016-02-03 LAB — FOLATE: Folate: >23.4 ng/mL (ref >5.9)

## 2016-02-03 LAB — PSA: PSA: 0.6 ng/mL (ref 0.10–4.00)

## 2016-02-03 LAB — FERRITIN: FERRITIN: 17.2 ng/mL — AB (ref 22.0–322.0)

## 2016-02-03 LAB — TSH: TSH: 1.3 u[IU]/mL (ref 0.35–4.50)

## 2016-02-03 LAB — VITAMIN B12: Vitamin B-12: 753 pg/mL (ref 211–911)

## 2016-02-03 MED ORDER — ATORVASTATIN CALCIUM 40 MG PO TABS: 40.0000 mg | ORAL_TABLET | Freq: Every day | ORAL | 3 refills | Status: DC

## 2016-02-03 MED ORDER — EXENATIDE ER 2 MG ~~LOC~~ PEN: 1.0000 | PEN_INJECTOR | SUBCUTANEOUS | 3 refills | Status: DC

## 2016-02-03 MED ORDER — METFORMIN HCL ER 750 MG PO TB24: 1500.0000 mg | ORAL_TABLET | Freq: Every day | ORAL | 1 refills | Status: DC

## 2016-02-03 NOTE — Progress Notes (Signed)
Subjective:  Patient ID: Taylor Torres, male    DOB: 1945-11-02  Age: 70 y.o. MRN: [id number]  CC: Anemia; Hyperlipidemia; Diabetes; and Annual Exam   HPI Taylor Torres presents for an AWV/CPX.  He is due for follow-up on diabetes mellitus. His last A1c was 6.7% about a year ago. Over the last year he has been cruising around the [place] and has gained weight. He denies polys.  He is also due for follow-up on anemia. He denies any sources of blood loss or paresthesias.  Past Medical History:  Diagnosis Date  . Alcoholism in family   . BPH (benign prostatic hypertrophy)   . Diverticulosis   . Family history of diabetes mellitus   . History of colonic polyps   . Hyperlipidemia    Past Surgical History:  Procedure Laterality Date  . COLONOSCOPY W/ POLYPECTOMY    . none      reports that he has never smoked. He has never used smokeless tobacco. He reports that he drinks about 1.2 oz of alcohol per week . He reports that he does not use drugs. family history includes Alcohol abuse in his other; Colon polyps in his father; Diabetes in his other; Esophageal cancer in his brother and brother. No Known Allergies  Outpatient Medications Prior to Visit  Medication Sig Dispense Refill  . B Complex-Biotin-FA (B-COMPLEX PO) Take 1 tablet by mouth daily.      Marland Kitchen MAGNESIUM-ZINC PO Take 1 tablet by mouth daily.    . Omega 3-6-9 Fatty Acids (TRIPLE OMEGA-3-6-9) CAPS Take 1 capsule by mouth daily.     . Amino Acids (AMINO ACID PO) Take 1 capsule by mouth daily.     Marland Kitchen aspirin 325 MG tablet Take 1 tablet (325 mg total) by mouth daily. HOLD FOR 2 WEEKS    . atorvastatin (LIPITOR) 40 MG tablet Take 1 tablet (40 mg total) by mouth daily. 90 tablet 3  . CINNAMON PO Take 1 capsule by mouth daily.    Marland Kitchen co-enzyme Q-10 30 MG capsule Take 30 mg by mouth daily.    . Digestive Enzymes (MULTI-ENZYME PO) Take 1 tablet by mouth daily.      . Multiple Vitamins-Minerals (MULTIVITAMIN WITH MINERALS) tablet Take 1  tablet by mouth daily.       No facility-administered medications prior to visit.     ROS Review of Systems  Constitutional: Positive for unexpected weight change. Negative for appetite change, chills, diaphoresis and fatigue.  HENT: Negative.  Negative for trouble swallowing.   Eyes: Negative.  Negative for visual disturbance.  Respiratory: Negative for cough, chest tightness, shortness of breath, wheezing and stridor.   Cardiovascular: Negative.  Negative for chest pain, palpitations and leg swelling.  Gastrointestinal: Negative for abdominal pain, blood in stool, constipation, diarrhea, nausea and vomiting.  Endocrine: Negative for cold intolerance, heat intolerance, polydipsia, polyphagia and polyuria.  Genitourinary: Negative for difficulty urinating, dysuria, flank pain, hematuria, penile swelling, scrotal swelling, testicular pain and urgency.  Musculoskeletal: Negative.  Negative for arthralgias, back pain, myalgias and neck pain.  Skin: Negative.  Negative for color change and rash.  Allergic/Immunologic: Negative.   Neurological: Negative.  Negative for dizziness, light-headedness and numbness.  Hematological: Negative.  Negative for adenopathy. Does not bruise/bleed easily.  Psychiatric/Behavioral: Negative.     Objective:  BP (!) 150/78 (BP Location: Left Arm, Patient Position: Sitting, Cuff Size: Normal)   Pulse 80   Temp 97.7 F (36.5 C) (Oral)   Resp 16  Ht 5\' 9"  (1.753 m)   Wt 195 lb (88.5 kg)   SpO2 97%   BMI 28.80 kg/m   BP Readings from Last 3 Encounters:  02/03/16 (!) 150/78  12/18/15 140/70  07/02/15 128/78    Wt Readings from Last 3 Encounters:  02/03/16 195 lb (88.5 kg)  12/18/15 189 lb (85.7 kg)  07/02/15 201 lb (91.2 kg)    Physical Exam  Constitutional: He appears well-developed and well-nourished. No distress.  HENT:  Mouth/Throat: Oropharynx is clear and moist. No oropharyngeal exudate.  Eyes: Conjunctivae are normal. Right eye exhibits  no discharge. Left eye exhibits no discharge. No scleral icterus.  Neck: Normal range of motion. Neck supple. No JVD present. No tracheal deviation present. No thyromegaly present.  Cardiovascular: Normal rate, regular rhythm, normal heart sounds and intact distal pulses.  Exam reveals no gallop and no friction rub.   No murmur heard. Pulmonary/Chest: Effort normal and breath sounds normal. No stridor. No respiratory distress. He has no wheezes. He has no rales. He exhibits no tenderness.  Abdominal: Soft. Bowel sounds are normal. He exhibits no distension and no mass. There is no tenderness. There is no rebound and no guarding. Hernia confirmed negative in the right inguinal area and confirmed negative in the left inguinal area.  Genitourinary: Rectum normal, testes normal and penis normal. Rectal exam shows no external hemorrhoid, no internal hemorrhoid, no fissure, no mass, no tenderness, anal tone normal and guaiac negative stool. Prostate is enlarged (1+ smooth symm BPH). Prostate is not tender. Right testis shows no mass, no swelling and no tenderness. Right testis is descended. Left testis shows no mass, no swelling and no tenderness. Left testis is descended. Circumcised. No penile erythema or penile tenderness. No discharge found.  Musculoskeletal: Normal range of motion. He exhibits no edema, tenderness or deformity.  Lymphadenopathy:    He has no cervical adenopathy.       Right: No inguinal adenopathy present.       Left: No inguinal adenopathy present.  Skin: He is not diaphoretic.  Vitals reviewed.   Lab Results  Component Value Date   WBC 7.4 02/03/2016   HGB 14.4 02/03/2016   HCT 42.7 02/03/2016   PLT 202.0 02/03/2016   GLUCOSE 260 (H) 02/03/2016   CHOL 149 02/03/2016   TRIG 221.0 (H) 02/03/2016   HDL 36.80 (L) 02/03/2016   LDLDIRECT 87.0 02/03/2016   LDLCALC 71 01/22/2015   ALT 21 02/03/2016   AST 14 02/03/2016   NA 138 02/03/2016   K 4.0 02/03/2016   CL 101  02/03/2016   CREATININE 0.97 02/03/2016   BUN 18 02/03/2016   CO2 27 02/03/2016   TSH 1.30 02/03/2016   PSA 0.60 02/03/2016   HGBA1C 8.3 (H) 02/03/2016   MICROALBUR 4.1 (H) 02/03/2016    No results found.  Assessment & Plan:   Isreal was seen today for anemia, hyperlipidemia, diabetes and annual exam.  Diagnoses and all orders for this visit:  Type 2 diabetes mellitus with complication, without long-term current use of insulin ([place])- his A1c is up to 8.3%. In addition to lifestyle modifications Avastin to start metformin and the GLP-1 agonist. He has his annual eye exam later today. -     Urinalysis, Routine w reflex microscopic; Future -     Hemoglobin A1c; Future -     Microalbumin / creatinine urine ratio; Future -     Comprehensive metabolic panel; Future -     atorvastatin (LIPITOR) 40 MG tablet;  Take 1 tablet (40 mg total) by mouth daily. -     metFORMIN (GLUCOPHAGE-XR) 750 MG 24 hr tablet; Take 2 tablets (1,500 mg total) by mouth daily with breakfast. -     Exenatide ER 2 MG PEN; Inject 1 Act into the skin once a week.  Pernicious anemia- his anemia has resolved and his vitamin levels are all normal. -     CBC with Differential/Platelet; Future -     Folate; Future -     Vitamin B12; Future -     Ferritin; Future -     IBC panel; Future  Hyperlipidemia with target LDL less than 130- he has achieved his LDL goal is doing well on the statin. -     Lipid panel; Future -     TSH; Future -     Comprehensive metabolic panel; Future -     atorvastatin (LIPITOR) 40 MG tablet; Take 1 tablet (40 mg total) by mouth daily.  BPH without obstruction/lower urinary tract symptoms- his PSA remains low so I'm not concerned about prostate cancer. Also, he has no symptoms that need to be treated. -     PSA; Future  Need for hepatitis C screening test -     Hepatitis C antibody; Future   I have discontinued Mr. Tietjen Digestive Enzymes (MULTI-ENZYME PO), multivitamin with minerals,  Amino Acids (AMINO ACID PO), CINNAMON PO, aspirin, and co-enzyme Q-10. I am also having him start on metFORMIN and Exenatide ER. Additionally, I am having him maintain his TRIPLE OMEGA-3-6-9, B Complex-Biotin-FA (B-COMPLEX PO), MAGNESIUM-ZINC PO, Vitamin D3, and atorvastatin.  Meds ordered this encounter  Medications  . Cholecalciferol (VITAMIN D3) 1000 units CAPS    Sig: Take 2,000 Units by mouth.  Marland Kitchen atorvastatin (LIPITOR) 40 MG tablet    Sig: Take 1 tablet (40 mg total) by mouth daily.    Dispense:  90 tablet    Refill:  3  . metFORMIN (GLUCOPHAGE-XR) 750 MG 24 hr tablet    Sig: Take 2 tablets (1,500 mg total) by mouth daily with breakfast.    Dispense:  180 tablet    Refill:  1  . Exenatide ER 2 MG PEN    Sig: Inject 1 Act into the skin once a week.    Dispense:  12 each    Refill:  3   See AVS for instructions about healthy living and anticipatory guidance.  Follow-up: Return in about 6 months (around 08/03/2016).  Scarlette Calico, MD

## 2016-02-03 NOTE — Progress Notes (Signed)
Pre visit review using our clinic review tool, if applicable. No additional management support is needed unless otherwise documented below in the visit note. 

## 2016-02-03 NOTE — Patient Instructions (Signed)

## 2016-02-04 ENCOUNTER — Encounter: Payer: Self-pay | Admitting: Internal Medicine

## 2016-02-04 LAB — HEPATITIS C ANTIBODY: HCV AB: NEGATIVE

## 2016-02-10 ENCOUNTER — Ambulatory Visit: Payer: Medicare Other

## 2016-02-10 DIAGNOSIS — Z719 Counseling, unspecified: Secondary | ICD-10-CM

## 2016-02-10 DIAGNOSIS — E118 Type 2 diabetes mellitus with unspecified complications: Secondary | ICD-10-CM

## 2016-02-10 NOTE — Assessment & Plan Note (Signed)

## 2016-02-10 NOTE — Progress Notes (Signed)
I have explained with bydureon pen how to properly use injectable pen---patient has given return demonstration for understanding---answered questions according to manufacturer suggested use/storage---patient has brochure with visual examples for using pen, a website address to look at for help and also can call back or schedule nurse visit with Trevonte Ashkar if any questions or if needs to have demonstration repeated

## 2016-02-11 ENCOUNTER — Encounter: Payer: Self-pay | Admitting: Internal Medicine

## 2016-03-12 ENCOUNTER — Encounter: Payer: Self-pay | Admitting: Internal Medicine

## 2016-03-13 DIAGNOSIS — L821 Other seborrheic keratosis: Secondary | ICD-10-CM | POA: Diagnosis not present

## 2016-03-13 DIAGNOSIS — D1801 Hemangioma of skin and subcutaneous tissue: Secondary | ICD-10-CM | POA: Diagnosis not present

## 2016-03-13 DIAGNOSIS — D225 Melanocytic nevi of trunk: Secondary | ICD-10-CM | POA: Diagnosis not present

## 2016-03-13 DIAGNOSIS — L219 Seborrheic dermatitis, unspecified: Secondary | ICD-10-CM | POA: Diagnosis not present

## 2016-03-13 DIAGNOSIS — L918 Other hypertrophic disorders of the skin: Secondary | ICD-10-CM | POA: Diagnosis not present

## 2016-06-26 ENCOUNTER — Other Ambulatory Visit: Payer: Self-pay | Admitting: Internal Medicine

## 2016-06-26 DIAGNOSIS — E118 Type 2 diabetes mellitus with unspecified complications: Secondary | ICD-10-CM

## 2016-07-27 ENCOUNTER — Observation Stay (HOSPITAL_COMMUNITY)
Admission: EM | Admit: 2016-07-27 | Discharge: 2016-07-28 | Disposition: A | Discharge status: Home / Self Care | Payer: Medicare Other | Attending: Internal Medicine | Admitting: Internal Medicine

## 2016-07-27 ENCOUNTER — Encounter (HOSPITAL_COMMUNITY): Payer: Self-pay | Admitting: Emergency Medicine

## 2016-07-27 ENCOUNTER — Telehealth: Payer: Self-pay | Admitting: Internal Medicine

## 2016-07-27 DIAGNOSIS — Z7984 Long term (current) use of oral hypoglycemic drugs: Secondary | ICD-10-CM | POA: Insufficient documentation

## 2016-07-27 DIAGNOSIS — D62 Acute posthemorrhagic anemia: Secondary | ICD-10-CM | POA: Diagnosis not present

## 2016-07-27 DIAGNOSIS — Z86010 Personal history of colonic polyps: Secondary | ICD-10-CM | POA: Diagnosis not present

## 2016-07-27 DIAGNOSIS — N400 Enlarged prostate without lower urinary tract symptoms: Secondary | ICD-10-CM | POA: Diagnosis not present

## 2016-07-27 DIAGNOSIS — Z66 Do not resuscitate: Secondary | ICD-10-CM | POA: Insufficient documentation

## 2016-07-27 DIAGNOSIS — Z79899 Other long term (current) drug therapy: Secondary | ICD-10-CM | POA: Diagnosis not present

## 2016-07-27 DIAGNOSIS — E119 Type 2 diabetes mellitus without complications: Secondary | ICD-10-CM | POA: Insufficient documentation

## 2016-07-27 DIAGNOSIS — D510 Vitamin B12 deficiency anemia due to intrinsic factor deficiency: Secondary | ICD-10-CM | POA: Diagnosis present

## 2016-07-27 DIAGNOSIS — K219 Gastro-esophageal reflux disease without esophagitis: Secondary | ICD-10-CM | POA: Diagnosis not present

## 2016-07-27 DIAGNOSIS — K625 Hemorrhage of anus and rectum: Secondary | ICD-10-CM | POA: Diagnosis not present

## 2016-07-27 DIAGNOSIS — Z8719 Personal history of other diseases of the digestive system: Secondary | ICD-10-CM

## 2016-07-27 DIAGNOSIS — R1314 Dysphagia, pharyngoesophageal phase: Secondary | ICD-10-CM | POA: Insufficient documentation

## 2016-07-27 DIAGNOSIS — E785 Hyperlipidemia, unspecified: Secondary | ICD-10-CM | POA: Diagnosis present

## 2016-07-27 DIAGNOSIS — K922 Gastrointestinal hemorrhage, unspecified: Secondary | ICD-10-CM | POA: Diagnosis not present

## 2016-07-27 DIAGNOSIS — E118 Type 2 diabetes mellitus with unspecified complications: Secondary | ICD-10-CM | POA: Diagnosis present

## 2016-07-27 HISTORY — DX: Type 2 diabetes mellitus without complications: E11.9

## 2016-07-27 LAB — TYPE AND SCREEN
ABO/RH(D): O POS
Antibody Screen: NEGATIVE

## 2016-07-27 LAB — COMPREHENSIVE METABOLIC PANEL
ALT: 16 U/L — AB (ref 17–63)
AST: 20 U/L (ref 15–41)
Albumin: 4.4 g/dL (ref 3.5–5.0)
Alkaline Phosphatase: 57 U/L (ref 38–126)
Anion gap: 7 (ref 5–15)
BUN: 16 mg/dL (ref 6–20)
CHLORIDE: 107 mmol/L (ref 101–111)
CO2: 25 mmol/L (ref 22–32)
CREATININE: 0.88 mg/dL (ref 0.61–1.24)
Calcium: 9.3 mg/dL (ref 8.9–10.3)
GFR calc non Af Amer: >60 mL/min (ref >60)
Glucose, Bld: 115 mg/dL — ABNORMAL HIGH (ref 65–99)
POTASSIUM: 4.2 mmol/L (ref 3.5–5.1)
SODIUM: 139 mmol/L (ref 135–145)
Total Bilirubin: 0.3 mg/dL (ref 0.3–1.2)
Total Protein: 7.4 g/dL (ref 6.5–8.1)

## 2016-07-27 LAB — CBC
HCT: 42 % (ref 39.0–52.0)
HEMATOCRIT: 36.7 % — AB (ref 39.0–52.0)
HEMATOCRIT: 38.3 % — AB (ref 39.0–52.0)
HEMOGLOBIN: 12.4 g/dL — AB (ref 13.0–17.0)
HEMOGLOBIN: 13 g/dL (ref 13.0–17.0)
Hemoglobin: 14.3 g/dL (ref 13.0–17.0)
MCH: 31 pg (ref 26.0–34.0)
MCH: 30.2 pg (ref 26.0–34.0)
MCH: 30.3 pg (ref 26.0–34.0)
MCHC: 34 g/dL (ref 30.0–36.0)
MCHC: 33.8 g/dL (ref 30.0–36.0)
MCHC: 33.9 g/dL (ref 30.0–36.0)
MCV: 90.9 fL (ref 78.0–100.0)
MCV: 89.3 fL (ref 78.0–100.0)
MCV: 89.3 fL (ref 78.0–100.0)
PLATELETS: 103 10*3/uL — AB (ref 150–400)
Platelets: 167 10*3/uL (ref 150–400)
Platelets: 169 10*3/uL (ref 150–400)
RBC: 4.11 MIL/uL — ABNORMAL LOW (ref 4.22–5.81)
RBC: 4.29 MIL/uL (ref 4.22–5.81)
RBC: 4.62 MIL/uL (ref 4.22–5.81)
RDW: 12.8 % (ref 11.5–15.5)
RDW: 12.8 % (ref 11.5–15.5)
RDW: 13 % (ref 11.5–15.5)
WBC: 6.6 10*3/uL (ref 4.0–10.5)
WBC: 7.3 10*3/uL (ref 4.0–10.5)
WBC: 8.3 10*3/uL (ref 4.0–10.5)

## 2016-07-27 MED ORDER — SODIUM CHLORIDE 0.9 % IV SOLN
INTRAVENOUS | Status: DC
  Administered 2016-07-27 – 2016-07-28 (×2): via INTRAVENOUS

## 2016-07-27 MED ORDER — SODIUM CHLORIDE 0.9 % IV BOLUS (SEPSIS)
1000.0000 mL | Freq: Once | INTRAVENOUS | Status: AC
  Administered 2016-07-27: 1000 mL via INTRAVENOUS

## 2016-07-27 MED ORDER — TRIPLE OMEGA-3-6-9 PO CAPS: 1.0000 | ORAL_CAPSULE | Freq: Every day | ORAL | Status: DC

## 2016-07-27 MED ORDER — ONDANSETRON HCL 4 MG PO TABS: 4.0000 mg | ORAL_TABLET | Freq: Four times a day (QID) | ORAL | Status: DC | PRN

## 2016-07-27 MED ORDER — DIPHENHYDRAMINE HCL 25 MG PO CAPS
25.0000 mg | ORAL_CAPSULE | Freq: Once | ORAL | Status: AC
  Administered 2016-07-27: 25 mg via ORAL
  Filled 2016-07-27: qty 1

## 2016-07-27 MED ORDER — ONDANSETRON HCL 4 MG/2ML IJ SOLN: 4.0000 mg | Freq: Four times a day (QID) | INTRAMUSCULAR | Status: DC | PRN

## 2016-07-27 MED ORDER — INSULIN ASPART 100 UNIT/ML ~~LOC~~ SOLN: 0.0000 [IU] | SUBCUTANEOUS | Status: DC

## 2016-07-27 MED ORDER — ACETAMINOPHEN 650 MG RE SUPP: 650.0000 mg | Freq: Four times a day (QID) | RECTAL | Status: DC | PRN

## 2016-07-27 MED ORDER — ACETAMINOPHEN 325 MG PO TABS: 650.0000 mg | ORAL_TABLET | Freq: Four times a day (QID) | ORAL | Status: DC | PRN

## 2016-07-27 MED ORDER — ATORVASTATIN CALCIUM 40 MG PO TABS
40.0000 mg | ORAL_TABLET | Freq: Every day | ORAL | Status: DC
  Administered 2016-07-28: 40 mg via ORAL
  Filled 2016-07-27: qty 4

## 2016-07-27 MED ORDER — OMEGA-3-ACID ETHYL ESTERS 1 G PO CAPS
1.0000 g | ORAL_CAPSULE | Freq: Every day | ORAL | Status: DC
  Administered 2016-07-28: 1 g via ORAL
  Filled 2016-07-27: qty 1

## 2016-07-27 NOTE — H&P (Signed)
History and Physical    Taylor Torres:[id number] DOB: 12/26/1945 DOA: 07/27/2016  PCP: Janith Lima, MD  Patient coming from: Home.  Chief Complaint: Rectal bleeding.  HPI: Taylor Torres is a 71 y.o. male with history of diabetes mellitus type 2, hyperlipidemia and previous history of GI bleed likely from diverticulosis presents to the ER after patient had large amount of rectal bleeding while at work. Patient states he had a normal bowel movement this morning. Afternoon he had a large amount of bloody bowel movement. Denies any abdominal pain or nausea or vomiting.   ED Course: In the ER patient had 2 more episodes. Patient remains hemodynamically stable. On-call gastroenterologist Dr. Carlean Purl was consulted and Dr. Carlean Purl advise observation. Patient has had colonoscopy in 2017 which showed diverticulosis. Patient denies taking any aspirin or NSAIDs.  Review of Systems: As per HPI, rest all negative.   Past Medical History:  Diagnosis Date  . Alcoholism in family   . BPH (benign prostatic hypertrophy)   . Diverticulosis   . Family history of diabetes mellitus   . History of colonic polyps   . Hyperlipidemia     Past Surgical History:  Procedure Laterality Date  . COLONOSCOPY W/ POLYPECTOMY    . none       reports that he has never smoked. He has never used smokeless tobacco. He reports that he drinks about 1.2 oz of alcohol per week . He reports that he does not use drugs.  No Known Allergies  Family History  Problem Relation Age of Onset  . Alcohol abuse Other   . Diabetes Other   . Esophageal cancer Brother        Dead  . Esophageal cancer Brother        alive  . Colon polyps Father   . Heart disease Neg Hx   . Hyperlipidemia Neg Hx   . Hypertension Neg Hx   . Kidney disease Neg Hx   . Stroke Neg Hx   . Early death Neg Hx   . Colon cancer Neg Hx   . Gallbladder disease Neg Hx     Prior to Admission medications   Medication Sig Start Date End Date  Taking? Authorizing Provider  atorvastatin (LIPITOR) 40 MG tablet Take 1 tablet (40 mg total) by mouth daily. 02/03/16  Yes Janith Lima, MD  B Complex-Biotin-FA (B-COMPLEX PO) Take 1 tablet by mouth daily.     Yes [provider]  Cholecalciferol (VITAMIN D3) 1000 units CAPS Take 2,000 Units by mouth.   Yes [provider]  CINNAMON PO Take 1 capsule by mouth daily.   Yes [provider]  diphenhydramine-acetaminophen (TYLENOL PM) 25-500 MG TABS tablet Take 1 tablet by mouth at bedtime as needed (sleep).   Yes [provider]  MAGNESIUM-ZINC PO Take 1 tablet by mouth daily.   Yes [provider]  metFORMIN (GLUCOPHAGE-XR) 750 MG 24 hr tablet Take 2 tablets (1,500 mg total) by mouth daily with breakfast. 02/03/16  Yes Janith Lima, MD  Multiple Vitamin (MULTIVITAMIN WITH MINERALS) TABS tablet Take 1 tablet by mouth daily.   Yes [provider]  naproxen sodium (ANAPROX) 220 MG tablet Take 220 mg by mouth 2 (two) times daily with a meal.   Yes [provider]  Omega 3-6-9 Fatty Acids (TRIPLE OMEGA-3-6-9) CAPS Take 1 capsule by mouth daily.    Yes [provider]  Exenatide ER 2 MG PEN Inject 1 Act into  the skin once a week. Patient not taking: Reported on 07/27/2016 02/03/16   Janith Lima, MD    Physical Exam: Vitals:   07/27/16 1612 07/27/16 1901 07/27/16 2007  BP: (!) 151/78 103/77   Pulse: 78 81   Resp: 18 16   Temp: 97.9 F (36.6 C)    TempSrc: Oral    SpO2: 100% 97%   Weight:   81.6 kg (180 lb)  Height:   5\' 10"  (1.778 m)      Constitutional: Moderately built and nourished. Vitals:   07/27/16 1612 07/27/16 1901 07/27/16 2007  BP: (!) 151/78 103/77   Pulse: 78 81   Resp: 18 16   Temp: 97.9 F (36.6 C)    TempSrc: Oral    SpO2: 100% 97%   Weight:   81.6 kg (180 lb)  Height:   5\' 10"  (1.778 m)   Eyes: Anicteric no pallor. ENMT: No discharge from the ears eyes nose and mouth. Neck: No mass  felt. No neck rigidity. Respiratory: No rhonchi or crepitations. Cardiovascular: S1-S2 heard no murmurs appreciated. Abdomen: Soft nontender bowel sounds present. No guarding or rigidity. Musculoskeletal: No edema. No joint effusion. Skin: No rash. Skin appears warm. Neurologic: Alert awake oriented to time place and person. Moves all extremities. Psychiatric: Appears normal. Normal affect.   Labs on Admission: I have personally reviewed following labs and imaging studies  CBC:  Recent Labs Lab 07/27/16 1613  WBC 6.6  HGB 14.3  HCT 42.0  MCV 90.9  PLT 747*   Basic Metabolic Panel:  Recent Labs Lab 07/27/16 1613  NA 139  K 4.2  CL 107  CO2 25  GLUCOSE 115*  BUN 16  CREATININE 0.88  CALCIUM 9.3   GFR: Estimated Creatinine Clearance: 80.7 mL/min (by C-G formula based on SCr of 0.88 mg/dL). Liver Function Tests:  Recent Labs Lab 07/27/16 1613  AST 20  ALT 16*  ALKPHOS 57  BILITOT 0.3  PROT 7.4  ALBUMIN 4.4   No results for input(s): LIPASE, AMYLASE in the last 168 hours. No results for input(s): AMMONIA in the last 168 hours. Coagulation Profile: No results for input(s): INR, PROTIME in the last 168 hours. Cardiac Enzymes: No results for input(s): CKTOTAL, CKMB, CKMBINDEX, TROPONINI in the last 168 hours. BNP (last 3 results) No results for input(s): PROBNP in the last 8760 hours. HbA1C: No results for input(s): HGBA1C in the last 72 hours. CBG: No results for input(s): GLUCAP in the last 168 hours. Lipid Profile: No results for input(s): CHOL, HDL, LDLCALC, TRIG, CHOLHDL, LDLDIRECT in the last 72 hours. Thyroid Function Tests: No results for input(s): TSH, T4TOTAL, FREET4, T3FREE, THYROIDAB in the last 72 hours. Anemia Panel: No results for input(s): VITAMINB12, FOLATE, FERRITIN, TIBC, IRON, RETICCTPCT in the last 72 hours. Urine analysis:    Component Value Date/Time   COLORURINE YELLOW 02/03/2016 [place] 02/03/2016 1416    LABSPEC 1.025 02/03/2016 1416   PHURINE 5.5 02/03/2016 1416   GLUCOSEU >=1000 (A) 02/03/2016 1416   HGBUR NEGATIVE 02/03/2016 1416   BILIRUBINUR NEGATIVE 02/03/2016 1416   BILIRUBINUR negative 01/13/2012 0939   KETONESUR NEGATIVE 02/03/2016 1416   PROTEINUR NEGATIVE 11/28/2014 0325   UROBILINOGEN 0.2 02/03/2016 1416   NITRITE NEGATIVE 02/03/2016 1416   LEUKOCYTESUR NEGATIVE 02/03/2016 1416   Sepsis Labs: @LABRCNTIP (procalcitonin:4,lacticidven:4) )No results found for this or any previous visit (from the past 240 hour(s)).   Radiological Exams on Admission: No results found.   Assessment/Plan Principal Problem:  Rectal bleeding Active Problems:   Hyperlipidemia with target LDL less than 130   Type 2 diabetes mellitus with complication, without long-term current use of insulin (HCC)   Pernicious anemia   History of GI diverticular bleed    1. Rectal bleeding likely diverticular in origin - patient has been placed on clear liquid diet and will follow CBC. Dr. Carlean Purl was consulted. Patient remains hemodynamically stable. 2. Diabetes mellitus type 2 - will hold metformin and keep patient on sliding scale coverage. 3. History of hyperlipidemia on statins.  I have reviewed patient's old charts and labs.   DVT prophylaxis: SCDs. Code Status: DO NOT RESUSCITATE.  Family Communication: Discussed with patient's wife.  Disposition Plan: Home.  Consults called: ER physician discussed with gastroenterologist.  Admission status: Observation.    Rise Patience MD [facility] Pager [phone].  If 7PM-7AM, please contact night-coverage [email or web address] Password [email or web address]  07/27/2016, 8:14 PM

## 2016-07-27 NOTE — ED Notes (Signed)
Bed: WA22 Expected date:  Expected time:  Means of arrival:  Comments: Hold for triage 

## 2016-07-27 NOTE — ED Notes (Signed)
Called to unit to give report. Nurse will call back.

## 2016-07-27 NOTE — Telephone Encounter (Signed)
Patient Name: Taylor Torres  DOB: 1945/12/29    Initial Comment Caller states he is bleeding from the rectum.    Nurse Assessment  Nurse: Raphael Gibney, RN, Vanita Ingles Date/Time (Eastern Time): 07/27/2016 2:35:44 PM  Confirm and document reason for call. If symptomatic, describe symptoms. ---Caller states he is having rectal bleeding. Toilet water is red x 1 today. Stool was black. Bleeding started today. no pain.  Does the patient have any new or worsening symptoms? ---Yes  Will a triage be completed? ---Yes  Related visit to physician within the last 2 weeks? ---No  Does the PT have any chronic conditions? (i.e. diabetes, asthma, etc.) ---Yes  List chronic conditions. ---diverticulosis  Is this a behavioral health or substance abuse call? ---No     Guidelines    Guideline Title Affirmed Question Affirmed Notes  Rectal Bleeding Tarry or jet black-colored stool (not dark green)    Final Disposition User   Go to ED Now Raphael Gibney, RN, Vera    Referrals  [facility] - ED   Disagree/Comply: Comply

## 2016-07-27 NOTE — ED Notes (Signed)
Pt had bowel movement. Sample had dark red blood.

## 2016-07-27 NOTE — ED Triage Notes (Signed)
Per pt, states he went to the bathroom and noticed blood in the toilet after taking a BM-states history of diverticulitis-no pain, N/V/D

## 2016-07-27 NOTE — ED Provider Notes (Signed)
[facility] DEPT Provider Note   CSN: [id number] Arrival date & time: 07/27/16  1557     History   Chief Complaint Chief Complaint  Patient presents with  . Rectal Bleeding    HPI Taylor Torres is a 71 y.o. male.  71 year old male with past medical history including type 2 diabetes mellitus, diverticulosis, hyperlipidemia, BPH who presents with rectal bleeding. The patient had 2 loose, nonbloody bowel movement earlier today. This afternoon, he went to the bathroom and had another bowel movement but this time he noticed that there was blood filling the toilet. He denies any associated pain but states that when he looked at the blood he did feel lightheaded for a brief period of time. He has had no abdominal pain, nausea, vomiting, fevers, urinary symptoms, or recent illness. He had a similar episode in 2016 for which he was admitted to the hospital and the bleeding eventually ceased when he was NPO for a period of time. It happened 2 more times at home and he managed it himself by not eating for a Taylor Torres while. Since these episodes, he has had a clear colonoscopy by Dr. Henrene Pastor last fall.    The history is provided by the patient.  Rectal Bleeding    Past Medical History:  Diagnosis Date  . Alcoholism in family   . BPH (benign prostatic hypertrophy)   . Diverticulosis   . Family history of diabetes mellitus   . History of colonic polyps   . Hyperlipidemia     Patient Active Problem List   Diagnosis Date Noted  . Need for hepatitis C screening test 02/03/2016  . History of GI diverticular bleed 12/18/2015  . Type 2 diabetes mellitus with complication, without long-term current use of insulin ([place]) 12/06/2014  . Pernicious anemia 12/06/2014  . Esophageal reflux   . Dysphagia, pharyngoesophageal phase 02/05/2014  . Routine general medical examination at a health care facility 01/12/2011  . Hyperlipidemia with target LDL less than 130 11/16/2008  . BPH without  obstruction/lower urinary tract symptoms 11/16/2008  . COLONIC POLYPS, HX OF 11/16/2008    Past Surgical History:  Procedure Laterality Date  . COLONOSCOPY W/ POLYPECTOMY    . none         Home Medications    Prior to Admission medications   Medication Sig Start Date End Date Taking? Authorizing Provider  atorvastatin (LIPITOR) 40 MG tablet Take 1 tablet (40 mg total) by mouth daily. 02/03/16  Yes Janith Lima, MD  B Complex-Biotin-FA (B-COMPLEX PO) Take 1 tablet by mouth daily.     Yes [provider]  Cholecalciferol (VITAMIN D3) 1000 units CAPS Take 2,000 Units by mouth.   Yes [provider]  CINNAMON PO Take 1 capsule by mouth daily.   Yes [provider]  diphenhydramine-acetaminophen (TYLENOL PM) 25-500 MG TABS tablet Take 1 tablet by mouth at bedtime as needed (sleep).   Yes [provider]  MAGNESIUM-ZINC PO Take 1 tablet by mouth daily.   Yes [provider]  metFORMIN (GLUCOPHAGE-XR) 750 MG 24 hr tablet Take 2 tablets (1,500 mg total) by mouth daily with breakfast. 02/03/16  Yes Janith Lima, MD  Multiple Vitamin (MULTIVITAMIN WITH MINERALS) TABS tablet Take 1 tablet by mouth daily.   Yes [provider]  naproxen sodium (ANAPROX) 220 MG tablet Take 220 mg by mouth 2 (two) times daily with a meal.   Yes [provider]  Omega 3-6-9 Fatty Acids (TRIPLE OMEGA-3-6-9) CAPS Take 1  capsule by mouth daily.    Yes [provider]  Exenatide ER 2 MG PEN Inject 1 Act into the skin once a week. Patient not taking: Reported on 07/27/2016 02/03/16   Janith Lima, MD    Family History Family History  Problem Relation Age of Onset  . Alcohol abuse Other   . Diabetes Other   . Esophageal cancer Brother        Dead  . Esophageal cancer Brother        alive  . Colon polyps Father   . Heart disease Neg Hx   . Hyperlipidemia Neg Hx   . Hypertension Neg Hx   . Kidney disease Neg Hx   . Stroke Neg Hx   .  Early death Neg Hx   . Colon cancer Neg Hx   . Gallbladder disease Neg Hx     Social History Social History  Substance Use Topics  . Smoking status: Never Smoker  . Smokeless tobacco: Never Used     Comment: Regular exercise - Yes  . Alcohol use 1.2 oz/week    2 Glasses of wine per week     Comment: 2 in a week      Allergies   Patient has no known allergies.   Review of Systems Review of Systems  Gastrointestinal: Positive for hematochezia.   All other systems reviewed and are negative except that which was mentioned in HPI  Physical Exam Updated Vital Signs BP (!) 151/78 (BP Location: Right Arm)   Pulse 78   Temp 97.9 F (36.6 C) (Oral)   Resp 18   SpO2 100%   Physical Exam  Constitutional: He is oriented to person, place, and time. He appears well-developed and well-nourished. No distress.  HENT:  Head: Normocephalic and atraumatic.  Moist mucous membranes  Eyes: Conjunctivae are normal. Pupils are equal, round, and reactive to light.  Neck: Neck supple.  Cardiovascular: Normal rate, regular rhythm and normal heart sounds.   No murmur heard. Pulmonary/Chest: Effort normal and breath sounds normal.  Abdominal: Soft. Bowel sounds are normal. He exhibits no distension. There is no tenderness.  Musculoskeletal: He exhibits no edema.  Neurological: He is alert and oriented to person, place, and time.  Fluent speech  Skin: Skin is warm and dry.  Psychiatric: He has a normal mood and affect. Judgment normal.  Nursing note and vitals reviewed.  Chaperone was present during exam.   ED Treatments / Results  Labs (all labs ordered are listed, but only abnormal results are displayed) Labs Reviewed  COMPREHENSIVE METABOLIC PANEL - Abnormal; Notable for the following:       Result Value   Glucose, Bld 115 (*)    ALT 16 (*)    All other components within normal limits  CBC - Abnormal; Notable for the following:    Platelets 103 (*)    All other components  within normal limits  CBC - Abnormal; Notable for the following:    HCT 38.3 (*)    All other components within normal limits  CBC - Abnormal; Notable for the following:    RBC 4.11 (*)    Hemoglobin 12.4 (*)    HCT 36.7 (*)    All other components within normal limits  CBC  BASIC METABOLIC PANEL  TYPE AND SCREEN    EKG  EKG Interpretation None       Radiology No results found.  Procedures Procedures (including critical care time)  Medications Ordered in ED Medications -  No data to display   Initial Impression / Assessment and Plan / ED Course  I have reviewed the triage vital signs and the nursing notes.  Pertinent labs & imaging results that were available during my care of the patient were reviewed by me and considered in my medical decision making (see chart for details).    PT w/ h/o painless rectal bleeding p/w episode of rectal bleeding this afternoon. He was well-appearing on exam with reassuring vital signs. No abdominal tenderness. Labs show Hgb 14.3, normal BUN and creatinine. Pt had large episode of bloody stool here. Given hx of similar sx and ongoing bleeding, discussed w/ GI Dr. Harlow Ohms, who agreed w/ admission and clear liquid diet. [facility] will see pt in consultation. Discussed admission with hospitalist, Dr. Hal Hope and pt admitted for further care.   Final Clinical Impressions(s) / ED Diagnoses   Final diagnoses:  Acute GI bleeding    New Prescriptions New Prescriptions   No medications on file     Felissa Blouch, Wenda Overland, MD 07/28/16 [phone]

## 2016-07-28 DIAGNOSIS — K921 Melena: Secondary | ICD-10-CM | POA: Diagnosis not present

## 2016-07-28 DIAGNOSIS — K625 Hemorrhage of anus and rectum: Secondary | ICD-10-CM

## 2016-07-28 DIAGNOSIS — K5731 Diverticulosis of large intestine without perforation or abscess with bleeding: Secondary | ICD-10-CM | POA: Diagnosis not present

## 2016-07-28 LAB — GLUCOSE, CAPILLARY
GLUCOSE-CAPILLARY: 118 mg/dL — AB (ref 65–99)
Glucose-Capillary: 103 mg/dL — ABNORMAL HIGH (ref 65–99)
Glucose-Capillary: 95 mg/dL (ref 65–99)

## 2016-07-28 LAB — BASIC METABOLIC PANEL
Anion gap: 8 (ref 5–15)
BUN: 12 mg/dL (ref 6–20)
CHLORIDE: 109 mmol/L (ref 101–111)
CO2: 23 mmol/L (ref 22–32)
Calcium: 8.4 mg/dL — ABNORMAL LOW (ref 8.9–10.3)
Creatinine, Ser: 0.85 mg/dL (ref 0.61–1.24)
GFR calc Af Amer: >60 mL/min (ref >60)
GFR calc non Af Amer: >60 mL/min (ref >60)
GLUCOSE: 102 mg/dL — AB (ref 65–99)
Potassium: 3.8 mmol/L (ref 3.5–5.1)
Sodium: 140 mmol/L (ref 135–145)

## 2016-07-28 LAB — CBC
HEMATOCRIT: 36.1 % — AB (ref 39.0–52.0)
HEMOGLOBIN: 11.8 g/dL — AB (ref 13.0–17.0)
MCH: 29.5 pg (ref 26.0–34.0)
MCHC: 32.7 g/dL (ref 30.0–36.0)
MCV: 90.3 fL (ref 78.0–100.0)
Platelets: 164 10*3/uL (ref 150–400)
RBC: 4 MIL/uL — ABNORMAL LOW (ref 4.22–5.81)
RDW: 12.8 % (ref 11.5–15.5)
WBC: 6.5 10*3/uL (ref 4.0–10.5)

## 2016-07-28 NOTE — Care Management Note (Signed)
Case Management Note  Patient Details  Name: Taylor Torres MRN: [id number] Date of Birth: 22-Nov-1945  Subjective/Objective:  71 y/o m admitted w/rectal bleed. From home. GI cons.                  Action/Plan:d/c plan home.   Expected Discharge Date:                  Expected Discharge Plan:  Home/Self Care  In-House Referral:     Discharge planning Services  CM Consult  Post Acute Care Choice:    Choice offered to:     DME Arranged:    DME Agency:     HH Arranged:    HH Agency:     Status of Service:  In process, will continue to follow  If discussed at Long Length of Stay Meetings, dates discussed:    Additional Comments:  Dessa Phi, RN 07/28/2016, 11:53 AM

## 2016-07-28 NOTE — Progress Notes (Addendum)
PROGRESS NOTE  Taylor Torres VOZ:[id number] DOB: 05/29/1945 DOA: 07/27/2016 PCP: Janith Lima, MD  HPI/Recap of past 24 hours:  Denies pain, no active bleed, no n/v, no fever Wife at bedside  Assessment/Plan: Principal Problem:   Rectal bleeding Active Problems:   Hyperlipidemia with target LDL less than 130   Type 2 diabetes mellitus with complication, without long-term current use of insulin (HCC)   Pernicious anemia   History of GI diverticular bleed  1. Rectal bleeding likely diverticular in origin - patient has been placed on clear liquid diet, ghb stable, Patient remains hemodynamically stable. [facility] consulted, input appreciated. If bleed again, consider bleeding scan. 2. noninsulin Diabetes mellitus type 2 -metformin held, on ssi here. 3. History of hyperlipidemia on statins.  I have reviewed patient's old charts and labs.   DVT prophylaxis: SCDs. Code Status: DO NOT RESUSCITATE.  Family Communication: Discussed with patient's wife.  Disposition Plan: Home , likely on 6/5 Consults called: [facility]   Procedures:  none  Antibiotics:  none   Objective: BP (!) 145/77 (BP Location: Left Arm)   Pulse 72   Temp 98.8 F (37.1 C) (Oral)   Resp 17   Ht 5\' 10"  (1.778 m)   Wt 83.4 kg (183 lb 13.8 oz)   SpO2 100%   BMI 26.38 kg/m   Intake/Output Summary (Last 24 hours) at 07/28/16 1705 Last data filed at 07/28/16 1453  Gross per 24 hour  Intake             1155 ml  Output                0 ml  Net             1155 ml   Filed Weights   07/27/16 2007 07/27/16 2116 07/28/16 1216  Weight: 81.6 kg (180 lb) 81.6 kg (180 lb) 83.4 kg (183 lb 13.8 oz)    Exam:   General:  NAD  Cardiovascular: RRR  Respiratory: CTABL  Abdomen: Soft/ND/NT, positive BS  Musculoskeletal: No Edema  Neuro: aaox3  Data Reviewed: Basic Metabolic Panel:  Recent Labs Lab 07/27/16 1613 07/28/16 0421  NA 139 140  K 4.2 3.8  CL 107 109  CO2 25 23  GLUCOSE 115* 102*    BUN 16 12  CREATININE 0.88 0.85  CALCIUM 9.3 8.4*   Liver Function Tests:  Recent Labs Lab 07/27/16 1613  AST 20  ALT 16*  ALKPHOS 57  BILITOT 0.3  PROT 7.4  ALBUMIN 4.4   No results for input(s): LIPASE, AMYLASE in the last 168 hours. No results for input(s): AMMONIA in the last 168 hours. CBC:  Recent Labs Lab 07/27/16 1613 07/27/16 2039 07/27/16 2337 07/28/16 0421  WBC 6.6 8.3 7.3 6.5  HGB 14.3 13.0 12.4* 11.8*  HCT 42.0 38.3* 36.7* 36.1*  MCV 90.9 89.3 89.3 90.3  PLT 103* 169 167 164   Cardiac Enzymes:   No results for input(s): CKTOTAL, CKMB, CKMBINDEX, TROPONINI in the last 168 hours. BNP (last 3 results) No results for input(s): BNP in the last 8760 hours.  ProBNP (last 3 results) No results for input(s): PROBNP in the last 8760 hours.  CBG:  Recent Labs Lab 07/27/16 2221 07/28/16 0425  GLUCAP 103* 95    No results found for this or any previous visit (from the past 240 hour(s)).   Studies: No results found.  Scheduled Meds: . atorvastatin  40 mg Oral Daily  . insulin aspart  0-9 Units  Subcutaneous Q4H  . omega-3 acid ethyl esters  1 g Oral Daily    Continuous Infusions: . sodium chloride 75 mL/hr at 07/28/16 1209     No chart note, patient want to be discharge home later the day due to learning that he is under observation status.  Angila Wombles MD, PhD  [facility] Pager [phone]. If 7PM-7AM, please contact night-coverage at [email or web address], password [email or web address] 07/28/2016, 5:05 PM  LOS: 0 days

## 2016-07-28 NOTE — Consult Note (Signed)
Referring Provider:  Dr. Hal Hope Primary Care Physician:  Janith Lima, MD Primary Gastroenterologist:  Dr. Henrene Pastor  Reason for Consultation:  GI bleed  HPI: Taylor Torres is a 71 y.o. male with history of diabetes mellitus type 2, hyperlipidemia, and previous history of GI bleed likely from diverticulosis in 2016.  He presented to the ER after he had large amount of rectal bleeding on the afternoon of 6/4 at about 2pm. Patient states he had a normal bowel movement earlier that morning. Afternoon he had a large amount of bloody bowel movement. Denies any abdominal pain or nausea or vomiting or any other complaints.  He says that he had two other episodes upon coming to the ED and then had one at 2AM, 4AM, and 6AM, all of which were large amounts of blood.  No passing of blood or stool in about 4 hours now and no sensation that he needs to go.   Patient denies taking any aspirin or NSAIDs.  Last colonoscopy 06/2015 by Dr. Henrene Pastor with multiple diverticuli in the left colon and internal hemorrhoids.  Hgb this AM is 11.8 grams.  Baseline Hgb 13.5-14.5 grams.   Past Medical History:  Diagnosis Date  . Alcoholism in family   . BPH (benign prostatic hypertrophy)   . Diabetes mellitus without complication ([place])   . Diverticulosis   . Family history of diabetes mellitus   . History of colonic polyps   . Hyperlipidemia     Past Surgical History:  Procedure Laterality Date  . COLONOSCOPY W/ POLYPECTOMY    . none      Prior to Admission medications   Medication Sig Start Date End Date Taking? Authorizing Provider  atorvastatin (LIPITOR) 40 MG tablet Take 1 tablet (40 mg total) by mouth daily. 02/03/16  Yes Janith Lima, MD  B Complex-Biotin-FA (B-COMPLEX PO) Take 1 tablet by mouth daily.     Yes [provider]  Cholecalciferol (VITAMIN D3) 1000 units CAPS Take 2,000 Units by mouth.   Yes [provider]  CINNAMON PO Take 1 capsule by mouth daily.   Yes [provider]  diphenhydramine-acetaminophen (TYLENOL PM) 25-500 MG TABS tablet Take 1 tablet by mouth at bedtime as needed (sleep).   Yes [provider]  MAGNESIUM-ZINC PO Take 1 tablet by mouth daily.   Yes [provider]  metFORMIN (GLUCOPHAGE-XR) 750 MG 24 hr tablet Take 2 tablets (1,500 mg total) by mouth daily with breakfast. 02/03/16  Yes Janith Lima, MD  Multiple Vitamin (MULTIVITAMIN WITH MINERALS) TABS tablet Take 1 tablet by mouth daily.   Yes [provider]  naproxen sodium (ANAPROX) 220 MG tablet Take 220 mg by mouth 2 (two) times daily with a meal.   Yes [provider]  Omega 3-6-9 Fatty Acids (TRIPLE OMEGA-3-6-9) CAPS Take 1 capsule by mouth daily.    Yes [provider]  Exenatide ER 2 MG PEN Inject 1 Act into the skin once a week. Patient not taking: Reported on 07/27/2016 02/03/16   Janith Lima, MD    Current Facility-Administered Medications  Medication Dose Route Frequency Provider Last Rate Last Dose  . 0.9 %  sodium chloride infusion   Intravenous Continuous Rise Patience, MD 75 mL/hr at 07/27/16 2222    . acetaminophen (TYLENOL) tablet 650 mg  650 mg Oral Q6H PRN Rise Patience, MD       Or  . acetaminophen (TYLENOL) suppository 650 mg  650 mg Rectal Q6H  PRN Rise Patience, MD      . atorvastatin (LIPITOR) tablet 40 mg  40 mg Oral Daily Rise Patience, MD      . insulin aspart (novoLOG) injection 0-9 Units  0-9 Units Subcutaneous Q4H Rise Patience, MD      . omega-3 acid ethyl esters (LOVAZA) capsule 1 g  1 g Oral Daily Rise Patience, MD      . ondansetron [facility]) tablet 4 mg  4 mg Oral Q6H PRN Rise Patience, MD       Or  . ondansetron [facility]) injection 4 mg  4 mg Intravenous Q6H PRN Rise Patience, MD        Allergies as of 07/27/2016  . (No Known Allergies)    Family History  Problem Relation Age of Onset  . Alcohol abuse Other   . Diabetes Other   .  Esophageal cancer Brother        Dead  . Esophageal cancer Brother        alive  . Colon polyps Father   . Heart disease Neg Hx   . Hyperlipidemia Neg Hx   . Hypertension Neg Hx   . Kidney disease Neg Hx   . Stroke Neg Hx   . Early death Neg Hx   . Colon cancer Neg Hx   . Gallbladder disease Neg Hx     Social History   Social History  . Marital status: Divorced    Spouse name: N/A  . Number of children: 2  . Years of education: N/A   Occupational History  . Retired    Social History Main Topics  . Smoking status: Never Smoker  . Smokeless tobacco: Never Used     Comment: Regular exercise - Yes  . Alcohol use 1.2 oz/week    2 Glasses of wine per week     Comment: 2 in a week   . Drug use: No  . Sexual activity: Not Currently   Other Topics Concern  . Not on file   Social History Narrative  . No narrative on file    Review of Systems: ROS O/W negative except as mentioned in HPI.  Physical Exam: Vital signs in last 24 hours: Temp:  [97.8 F (36.6 C)-98.3 F (36.8 C)] 97.8 F (36.6 C) (06/05 0418) Pulse Rate:  [73-81] 73 (06/05 0418) Resp:  [14-18] 18 (06/05 0418) BP: (103-151)/(74-78) 115/74 (06/05 0418) SpO2:  [97 %-100 %] 99 % (06/05 0418) Weight:  [180 lb (81.6 kg)] 180 lb (81.6 kg) (06/04 2116) Last BM Date: 07/28/16 General:  Alert, Well-developed, well-nourished, pleasant and cooperative in NAD Head:  Normocephalic and atraumatic. Eyes:  Sclera clear, no icterus.  Conjunctiva pink. Ears:  Normal auditory acuity. Mouth:  No deformity or lesions.   Lungs:  Clear throughout to auscultation.  No wheezes, crackles, or rhonchi.  Heart:  Regular rate and rhythm; no murmurs, clicks, rubs, or gallops. Abdomen:  Soft, non-distended.  BS present.  Non-tender. Rectal:  Deferred  Msk:  Symmetrical without gross deformities. Pulses:  Normal pulses noted. Extremities:  Without clubbing or edema. Neurologic:  Alert and oriented x 4;  grossly normal  neurologically. Skin:  Intact without significant lesions or rashes. Psych:  Alert and cooperative. Normal mood and affect.  Intake/Output from previous day: 06/04 0701 - 06/05 0700 In: 675 [I.V.:675] Out: -  Intake/Output this shift: Total I/O In: 360 [P.O.:360] Out: -   Lab Results:  Recent Labs  07/27/16 2039  07/27/16 2337 07/28/16 0421  WBC 8.3 7.3 6.5  HGB 13.0 12.4* 11.8*  HCT 38.3* 36.7* 36.1*  PLT 169 167 164   BMET  Recent Labs  07/27/16 1613 07/28/16 0421  NA 139 140  K 4.2 3.8  CL 107 109  CO2 25 23  GLUCOSE 115* 102*  BUN 16 12  CREATININE 0.88 0.85  CALCIUM 9.3 8.4*   LFT  Recent Labs  07/27/16 1613  PROT 7.4  ALBUMIN 4.4  AST 20  ALT 16*  ALKPHOS 31  BILITOT 0.3   IMPRESSION:  -Painless hematochezia:  Very likely diverticular in origin. -ABLA:  Hgb appears to be 13.5-14.5 grams at baseline.  11.8 grams this AM.  PLAN: -Continue clear liquids for now with observation, monitoring of Hgb.  Hopefully this will resolve without intervention, but if continues to bleed heavily then may need NM bleeding scan.   ZEHR, JESSICA D.  07/28/2016, 8:56 AM  Pager number [phone]    Attending physician's note   I have taken a history, examined the patient and reviewed the chart. I agree with the Advanced Practitioner's note, impression and recommendations. 5 yr M with h/o left sided diverticulosis with painless hematochezia He has taking NSAID's Alleve daily for past few days Likely etiology diverticular hemorrhage On exam abd is soft, NTND Clear liquid diet Continue to monitor If continues to have further episodes of hematochezia NM RBC scan to localize and angio embolization by IR   K Denzil Magnuson, MD [phone] Mon-Fri 8a-5p [phone] after 5p, weekends, holidays

## 2016-07-28 NOTE — Care Management Note (Signed)
Case Management Note  Patient Details  Name: Taylor Torres MRN: [id number] Date of Birth: Jan 24, 1946  Subjective/Objective:                  71 y.o. male with history of diabetes mellitus type 2, hyperlipidemia and previous history of GI bleed likely from diverticulosis presents to the ER after patient had large amount of rectal bleeding while at work. Patient states he had a normal bowel movement this morning. Afternoon he had a large amount of bloody bowel movement. Denies any abdominal pain or nausea or vomiting.   ED Course: In the ER patient had 2 more episodes. Patient remains hemodynamically stable. On-call gastroenterologist Dr. Carlean Purl was consulted and Dr. Carlean Purl advise observation. Patient has had colonoscopy in 2017 which showed diverticulosis. Patient denies taking any aspirin or NSAIDs.  Action/Plan: Date:  July 28, 2016  Chart reviewed for concurrent status and case management needs.  Will continue to follow patient progress.  Discharge Planning: following for needs  Expected discharge date: [id number]  Velva Harman, BSN, [place], [place]   Expected Discharge Date:                  Expected Discharge Plan:  Home/Self Care  In-House Referral:     Discharge planning Services  CM Consult  Post Acute Care Choice:    Choice offered to:     DME Arranged:    DME Agency:     HH Arranged:    HH Agency:     Status of Service:  In process, will continue to follow  If discussed at Long Length of Stay Meetings, dates discussed:    Additional Comments:  Leeroy Cha, RN 07/28/2016, 9:19 AM

## 2016-07-28 NOTE — Progress Notes (Signed)
Patient is A&Ox4 and ambulatory without assist. Discharge instructions reviewed. Pt encouraged to report back to ER should s&s arise. Pt verbalized understanding. Questions, concerns denied

## 2016-07-28 NOTE — Discharge Summary (Signed)
Discharge Summary  Taylor Torres MGQ:[id number] DOB: 1946-01-27  PCP: Janith Lima, MD  Admit date: 07/27/2016 Discharge date: 07/28/2016  Time spent: <108mins  Recommendations for Outpatient Follow-up:  1. F/u with PMD within a week  for hospital discharge follow up, repeat cbc/bmp at follow up 2. F/u with [facility]  Discharge Diagnoses:  Active Hospital Problems   Diagnosis Date Noted  . Rectal bleeding 11/27/2014  . History of GI diverticular bleed 12/18/2015  . Type 2 diabetes mellitus with complication, without long-term current use of insulin ([place]) 12/06/2014  . Pernicious anemia 12/06/2014  . Hyperlipidemia with target LDL less than 130 11/16/2008    Resolved Hospital Problems   Diagnosis Date Noted Date Resolved  No resolved problems to display.    Discharge Condition: stable  Diet recommendation: heart healthy/carb modified  Filed Weights   07/27/16 2007 07/27/16 2116 07/28/16 1216  Weight: 81.6 kg (180 lb) 81.6 kg (180 lb) 83.4 kg (183 lb 13.8 oz)    History of present illness:  PCP: Janith Lima, MD  Patient coming from: Home.  Chief Complaint: Rectal bleeding.  HPI: Taylor Torres is a 71 y.o. male with history of diabetes mellitus type 2, hyperlipidemia and previous history of GI bleed likely from diverticulosis presents to the ER after patient had large amount of rectal bleeding while at work. Patient states he had a normal bowel movement this morning. Afternoon he had a large amount of bloody bowel movement. Denies any abdominal pain or nausea or vomiting.   ED Course: In the ER patient had 2 more episodes. Patient remains hemodynamically stable. On-call gastroenterologist Dr. Carlean Purl was consulted and Dr. Carlean Purl advise observation. Patient has had colonoscopy in 2017 which showed diverticulosis. Patient denies taking any aspirin or NSAIDs.  Hospital Course:  Principal Problem:   Rectal bleeding Active Problems:   Hyperlipidemia with target LDL less  than 130   Type 2 diabetes mellitus with complication, without long-term current use of insulin ([facility])   Pernicious anemia   History of GI diverticular bleed   1. Rectal bleeding likely diverticular in origin - patient has been placed on clear liquid diet, ghb stable, Patient remains hemodynamically stable. [facility] consulted, input appreciated. If bleed again, consider bleeding scan. However, after patient learned that he is under observation status, he and his wife demand to be discharged home. I have explained to him and his wife diverticula bleed is unpredictable, it could rebleed within 72hrs, he states he understand the risk, he does not want to stay in the hospital , he is advised to come back to the ED if he bleed again. He is to  Follow up with pmd closely and to follow up with GI. 2. noninsulin Diabetes mellitus type 2 -metformin held, on ssi here. 3. History of hyperlipidemia on statins.  I have reviewed patient's old charts and labs.   DVT prophylaxis:SCDs. Code Status:Full code Family Communication:Discussed with  Patient and patient's wife. Disposition Plan:patient and wife are adamant to be discharged home after learning their are under observation status Consults called:[facility]   Procedures:  none  Antibiotics:  none   Discharge Exam: BP (!) 145/77 (BP Location: Left Arm)   Pulse 72   Temp 98.8 F (37.1 C) (Oral)   Resp 17   Ht 5\' 10"  (1.778 m)   Wt 83.4 kg (183 lb 13.8 oz)   SpO2 100%   BMI 26.38 kg/m   General: NAD Cardiovascular: RRR Respiratory: CTABL  Discharge Instructions You were  cared for by a hospitalist during your hospital stay. If you have any questions about your discharge medications or the care you received while you were in the hospital after you are discharged, you can call the unit and asked to speak with the hospitalist on call if the hospitalist that took care of you is not available. Once you are discharged, your primary care  physician will handle any further medical issues. Please note that NO REFILLS for any discharge medications will be authorized once you are discharged, as it is imperative that you return to your primary care physician (or establish a relationship with a primary care physician if you do not have one) for your aftercare needs so that they can reassess your need for medications and monitor your lab values.  Discharge Instructions    Diet - low sodium heart healthy    Complete by:  As directed    Increase activity slowly    Complete by:  As directed      Allergies as of 07/28/2016   No Known Allergies     Medication List    STOP taking these medications   Exenatide ER 2 MG Pen   naproxen sodium 220 MG tablet Commonly known as:  ANAPROX     TAKE these medications   atorvastatin 40 MG tablet Commonly known as:  LIPITOR Take 1 tablet (40 mg total) by mouth daily.   B-COMPLEX PO Take 1 tablet by mouth daily.   CINNAMON PO Take 1 capsule by mouth daily.   diphenhydramine-acetaminophen 25-500 MG Tabs tablet Commonly known as:  TYLENOL PM Take 1 tablet by mouth at bedtime as needed (sleep).   MAGNESIUM-ZINC PO Take 1 tablet by mouth daily.   metFORMIN 750 MG 24 hr tablet Commonly known as:  GLUCOPHAGE-XR Take 2 tablets (1,500 mg total) by mouth daily with breakfast.   multivitamin with minerals Tabs tablet Take 1 tablet by mouth daily.   TRIPLE OMEGA-3-6-9 Caps Take 1 capsule by mouth daily.   Vitamin D3 1000 units Caps Take 2,000 Units by mouth.      No Known Allergies Follow-up Information    Janith Lima, MD Follow up on 07/29/2016.   Specialty:  Internal Medicine Contact information: [address] [address] [phone]        Irene Shipper, MD Follow up in 3 week(s).   Specialty:  Gastroenterology Contact information: [address] [place] [address] [phone]            The results of significant  diagnostics from this hospitalization (including imaging, microbiology, ancillary and laboratory) are listed below for reference.    Significant Diagnostic Studies: No results found.  Microbiology: No results found for this or any previous visit (from the past 240 hour(s)).   Labs: Basic Metabolic Panel:  Recent Labs Lab 07/27/16 1613 07/28/16 0421  NA 139 140  K 4.2 3.8  CL 107 109  CO2 25 23  GLUCOSE 115* 102*  BUN 16 12  CREATININE 0.88 0.85  CALCIUM 9.3 8.4*   Liver Function Tests:  Recent Labs Lab 07/27/16 1613  AST 20  ALT 16*  ALKPHOS 57  BILITOT 0.3  PROT 7.4  ALBUMIN 4.4   No results for input(s): LIPASE, AMYLASE in the last 168 hours. No results for input(s): AMMONIA in the last 168 hours. CBC:  Recent Labs Lab 07/27/16 1613 07/27/16 2039 07/27/16 2337 07/28/16 0421  WBC 6.6 8.3 7.3 6.5  HGB 14.3  13.0 12.4* 11.8*  HCT 42.0 38.3* 36.7* 36.1*  MCV 90.9 89.3 89.3 90.3  PLT 103* 169 167 164   Cardiac Enzymes: No results for input(s): CKTOTAL, CKMB, CKMBINDEX, TROPONINI in the last 168 hours. BNP: BNP (last 3 results) No results for input(s): BNP in the last 8760 hours.  ProBNP (last 3 results) No results for input(s): PROBNP in the last 8760 hours.  CBG:  Recent Labs Lab 07/27/16 2221 07/28/16 0425 07/28/16 1702  GLUCAP 103* 95 118*       Signed:  Kendahl Bumgardner MD, PhD  [facility] 07/28/2016, 6:10 PM

## 2016-07-28 NOTE — Care Management Obs Status (Signed)
[place] NOTIFICATION   Patient Details  Name: DAVEY LIMAS MRN: [id number] Date of Birth: 12/04/1945   Medicare Observation Status Notification Given:  Yes    MahabirJuliann Pulse, RN 07/28/2016, 1:18 PM

## 2016-07-29 ENCOUNTER — Telehealth: Payer: Self-pay | Admitting: *Deleted

## 2016-07-29 NOTE — Telephone Encounter (Signed)
Transition Care Management Follow-up Telephone Call  Date discharged? 07/28/16   How have you been since you were released from the hospital? Pt states he is feeling alright   Do you understand why you were in the hospital? YES   Do you understand the discharge instructions? YES   Where were you discharged to? Home   Items Reviewed:  Medications reviewed: YES  Allergies reviewed: YES  Dietary changes reviewed: YES, heart healthy  Referrals reviewed: No referral needed   Functional Questionnaire:   Activities of Daily Living (ADLs):   He states he are independent in the following: ambulation, bathing and hygiene, feeding, continence, grooming, toileting and dressing States he doesn't  require assistance    Any transportation issues/concerns?: NO   Any patient concerns? NO   Confirmed importance and date/time of follow-up visits scheduled YES, appt 07/30/16  Provider Appointment booked with Dr. Ronnald Ramp  Confirmed with patient if condition begins to worsen call PCP or go to the ER.  Patient was given the office number and encouraged to call back with question or concerns.  : YES

## 2016-07-30 ENCOUNTER — Other Ambulatory Visit (INDEPENDENT_AMBULATORY_CARE_PROVIDER_SITE_OTHER): Payer: Medicare Other

## 2016-07-30 ENCOUNTER — Ambulatory Visit (INDEPENDENT_AMBULATORY_CARE_PROVIDER_SITE_OTHER): Payer: Medicare Other | Admitting: Internal Medicine

## 2016-07-30 ENCOUNTER — Encounter: Payer: Self-pay | Admitting: Internal Medicine

## 2016-07-30 VITALS — BP 130/80 | HR 86 | Temp 97.6°F | Resp 16 | Ht 70.0 in | Wt 181.5 lb

## 2016-07-30 DIAGNOSIS — Z8719 Personal history of other diseases of the digestive system: Secondary | ICD-10-CM | POA: Diagnosis not present

## 2016-07-30 DIAGNOSIS — D510 Vitamin B12 deficiency anemia due to intrinsic factor deficiency: Secondary | ICD-10-CM | POA: Diagnosis not present

## 2016-07-30 DIAGNOSIS — E118 Type 2 diabetes mellitus with unspecified complications: Secondary | ICD-10-CM

## 2016-07-30 DIAGNOSIS — Z862 Personal history of diseases of the blood and blood-forming organs and certain disorders involving the immune mechanism: Secondary | ICD-10-CM | POA: Diagnosis not present

## 2016-07-30 LAB — FOLATE: Folate: >24 ng/mL (ref >5.9)

## 2016-07-30 LAB — CBC WITH DIFFERENTIAL/PLATELET
BASOS ABS: 0 10*3/uL (ref 0.0–0.1)
Basophils Relative: 0.5 % (ref 0.0–3.0)
EOS ABS: 0.2 10*3/uL (ref 0.0–0.7)
Eosinophils Relative: 3.1 % (ref 0.0–5.0)
HEMATOCRIT: 39.4 % (ref 39.0–52.0)
HEMOGLOBIN: 13.3 g/dL (ref 13.0–17.0)
Lymphocytes Relative: 29.8 % (ref 12.0–46.0)
Lymphs Abs: 2.2 10*3/uL (ref 0.7–4.0)
MCHC: 33.8 g/dL (ref 30.0–36.0)
MCV: 89.8 fl (ref 78.0–100.0)
Monocytes Absolute: 0.6 10*3/uL (ref 0.1–1.0)
Monocytes Relative: 8.7 % (ref 3.0–12.0)
Neutro Abs: 4.2 10*3/uL (ref 1.4–7.7)
Neutrophils Relative %: 57.9 % (ref 43.0–77.0)
PLATELETS: 211 10*3/uL (ref 150.0–400.0)
RBC: 4.39 Mil/uL (ref 4.22–5.81)
RDW: 12.8 % (ref 11.5–15.5)
WBC: 7.3 10*3/uL (ref 4.0–10.5)

## 2016-07-30 LAB — HEMOGLOBIN A1C: Hgb A1c MFr Bld: 6 % (ref 4.6–6.5)

## 2016-07-30 LAB — VITAMIN B12: VITAMIN B 12: 1347 pg/mL — AB (ref 211–911)

## 2016-07-30 LAB — IBC PANEL
Iron: 60 ug/dL (ref 42–165)
SATURATION RATIOS: 15.7 % — AB (ref 20.0–50.0)
TRANSFERRIN: 273 mg/dL (ref 212.0–360.0)

## 2016-07-30 LAB — FERRITIN: FERRITIN: 30.3 ng/mL (ref 22.0–322.0)

## 2016-07-30 NOTE — Patient Instructions (Signed)
Anemia, Nonspecific Anemia is a condition in which the concentration of red blood cells or hemoglobin in the blood is below normal. Hemoglobin is a substance in red blood cells that carries oxygen to the tissues of the body. Anemia results in not enough oxygen reaching these tissues. What are the causes? Common causes of anemia include:  Excessive bleeding. Bleeding may be internal or external. This includes excessive bleeding from periods (in women) or from the intestine.  Poor nutrition.  Chronic kidney, thyroid, and liver disease.  Bone marrow disorders that decrease red blood cell production.  Cancer and treatments for cancer.  HIV, AIDS, and their treatments.  Spleen problems that increase red blood cell destruction.  Blood disorders.  Excess destruction of red blood cells due to infection, medicines, and autoimmune disorders. What are the signs or symptoms?  Minor weakness.  Dizziness.  Headache.  Palpitations.  Shortness of breath, especially with exercise.  Paleness.  Cold sensitivity.  Indigestion.  Nausea.  Difficulty sleeping.  Difficulty concentrating. Symptoms may occur suddenly or they may develop slowly. How is this diagnosed? Additional blood tests are often needed. These help your health care provider determine the best treatment. Your health care provider will check your stool for blood and look for other causes of blood loss. How is this treated? Treatment varies depending on the cause of the anemia. Treatment can include:  Supplements of iron, vitamin B12, or folic acid.  Hormone medicines.  A blood transfusion. This may be needed if blood loss is severe.  Hospitalization. This may be needed if there is significant continual blood loss.  Dietary changes.  Spleen removal. Follow these instructions at home: Keep all follow-up appointments. It often takes many weeks to correct anemia, and having your health care provider check on your  condition and your response to treatment is very important. Get help right away if:  You develop extreme weakness, shortness of breath, or chest pain.  You become dizzy or have trouble concentrating.  You develop heavy vaginal bleeding.  You develop a rash.  You have bloody or black, tarry stools.  You faint.  You vomit up blood.  You vomit repeatedly.  You have abdominal pain.  You have a fever or persistent symptoms for more than 2-3 days.  You have a fever and your symptoms suddenly get worse.  You are dehydrated. This information is not intended to replace advice given to you by your health care provider. Make sure you discuss any questions you have with your health care provider. Document Released: 03/19/2004 Document Revised: 07/24/2015 Document Reviewed: 08/05/2012 Elsevier Interactive Patient Education  2017 [employer]  

## 2016-07-30 NOTE — Progress Notes (Signed)
Subjective:  Patient ID: Taylor Torres, male    DOB: Oct 06, 1945  Age: 71 y.o. MRN: [id number]  CC: Anemia   HPI Taylor Torres presents for Follow-up after recent admission for lower GI bleed. He has a history of diverticular bleeding and had taken some Aleve for musculoskeletal pain which prompted a lower GI bleed. He was briefly admitted and was mildly anemic but did not require transfusion. He tells me he has had no more bleeding and denies melena, abdominal pain, diarrhea, constipation, straining, dizziness, or lightheadedness.  Outpatient Medications Prior to Visit  Medication Sig Dispense Refill  . atorvastatin (LIPITOR) 40 MG tablet Take 1 tablet (40 mg total) by mouth daily. 90 tablet 3  . B Complex-Biotin-FA (B-COMPLEX PO) Take 1 tablet by mouth daily.      . Cholecalciferol (VITAMIN D3) 1000 units CAPS Take 2,000 Units by mouth.    Marland Kitchen CINNAMON PO Take 1 capsule by mouth daily.    Marland Kitchen MAGNESIUM-ZINC PO Take 1 tablet by mouth daily.    . Multiple Vitamin (MULTIVITAMIN WITH MINERALS) TABS tablet Take 1 tablet by mouth daily.    . diphenhydramine-acetaminophen (TYLENOL PM) 25-500 MG TABS tablet Take 1 tablet by mouth at bedtime as needed (sleep).    . metFORMIN (GLUCOPHAGE-XR) 750 MG 24 hr tablet Take 2 tablets (1,500 mg total) by mouth daily with breakfast. 180 tablet 1  . Omega 3-6-9 Fatty Acids (TRIPLE OMEGA-3-6-9) CAPS Take 1 capsule by mouth daily.      No facility-administered medications prior to visit.     ROS Review of Systems  Constitutional: Negative.  Negative for appetite change, chills, fatigue and unexpected weight change.  HENT: Negative.   Eyes: Negative.  Negative for visual disturbance.  Respiratory: Negative for cough, chest tightness, shortness of breath and wheezing.   Cardiovascular: Negative for chest pain, palpitations and leg swelling.  Gastrointestinal: Negative for abdominal pain, anal bleeding, blood in stool, constipation, diarrhea, nausea and  vomiting.  Endocrine: Negative for polydipsia, polyphagia and polyuria.  Genitourinary: Negative.  Negative for difficulty urinating, dysuria and hematuria.  Musculoskeletal: Negative.  Negative for back pain and neck pain.  Skin: Negative.   Neurological: Negative.  Negative for dizziness, syncope, weakness, light-headedness and numbness.  Hematological: Negative for adenopathy. Does not bruise/bleed easily.  Psychiatric/Behavioral: Negative.     Objective:  BP 130/80 (BP Location: Left Arm, Patient Position: Sitting, Cuff Size: Normal)   Pulse 86   Temp 97.6 F (36.4 C) (Oral)   Resp 16   Ht 5\' 10"  (1.778 m)   Wt 181 lb 8 oz (82.3 kg)   SpO2 98%   BMI 26.04 kg/m   BP Readings from Last 3 Encounters:  07/30/16 130/80  07/28/16 (!) 145/77  02/03/16 (!) 150/78    Wt Readings from Last 3 Encounters:  07/30/16 181 lb 8 oz (82.3 kg)  07/28/16 183 lb 13.8 oz (83.4 kg)  02/03/16 195 lb (88.5 kg)    Physical Exam  Constitutional: He is oriented to person, place, and time. No distress.  HENT:  Mouth/Throat: Oropharynx is clear and moist. No oropharyngeal exudate.  Eyes: Conjunctivae are normal. No scleral icterus.  Neck: Normal range of motion. Neck supple. No JVD present.  Cardiovascular: Normal rate, regular rhythm and intact distal pulses.  Exam reveals no gallop.   No murmur heard. Pulmonary/Chest: Effort normal and breath sounds normal. No respiratory distress. He has no wheezes. He has no rales. He exhibits no tenderness.  Abdominal: Soft. Bowel  sounds are normal. He exhibits no distension and no mass. There is no tenderness. There is no rebound and no guarding.  Musculoskeletal: Normal range of motion. He exhibits no edema, tenderness or deformity.  Neurological: He is alert and oriented to person, place, and time.  Skin: Skin is warm and dry. No rash noted. He is not diaphoretic. No erythema. No pallor.  Vitals reviewed.   Lab Results  Component Value Date   WBC  7.3 07/30/2016   HGB 13.3 07/30/2016   HCT 39.4 07/30/2016   PLT 211.0 07/30/2016   GLUCOSE 102 (H) 07/28/2016   CHOL 149 02/03/2016   TRIG 221.0 (H) 02/03/2016   HDL 36.80 (L) 02/03/2016   LDLDIRECT 87.0 02/03/2016   LDLCALC 71 01/22/2015   ALT 16 (L) 07/27/2016   AST 20 07/27/2016   NA 140 07/28/2016   K 3.8 07/28/2016   CL 109 07/28/2016   CREATININE 0.85 07/28/2016   BUN 12 07/28/2016   CO2 23 07/28/2016   TSH 1.30 02/03/2016   PSA 0.60 02/03/2016   HGBA1C 6.0 07/30/2016   MICROALBUR 4.1 (H) 02/03/2016    No results found.  Assessment & Plan:   Taylor Torres was seen today for anemia.  Diagnoses and all orders for this visit:  Pernicious anemia- his anemia has resolved and his vitamin levels are normal -     CBC with Differential/Platelet; Future -     IBC panel; Future -     Ferritin; Future -     Folate; Future -     Vitamin B12; Future  Type 2 diabetes mellitus with complication, without long-term current use of insulin ([place])- his A1c is down to 6.0%, I've advised him to stop taking metformin since it's not necessary anymore. -     Hemoglobin A1c; Future  Other orders -     Cancel: CBC w/Diff; Future   I have discontinued Taylor Torres TRIPLE OMEGA-3-6-9, metFORMIN, and diphenhydramine-acetaminophen. I am also having him maintain his B Complex-Biotin-FA (B-COMPLEX PO), MAGNESIUM-ZINC PO, Vitamin D3, atorvastatin, multivitamin with minerals, and CINNAMON PO.  No orders of the defined types were placed in this encounter.    Follow-up: Return in about 3 months (around 10/30/2016).  Scarlette Calico, MD

## 2016-07-31 ENCOUNTER — Encounter: Payer: Self-pay | Admitting: Internal Medicine

## 2016-08-01 LAB — GLUCOSE, CAPILLARY
GLUCOSE-CAPILLARY: 100 mg/dL — AB (ref 65–99)
GLUCOSE-CAPILLARY: 92 mg/dL (ref 65–99)
Glucose-Capillary: 148 mg/dL — ABNORMAL HIGH (ref 65–99)

## 2016-08-01 NOTE — Assessment & Plan Note (Signed)
The bleeding has ceased His anemia has resolved He agrees to avoid anti-inflammatories in the future

## 2017-01-28 DIAGNOSIS — H5213 Myopia, bilateral: Secondary | ICD-10-CM | POA: Diagnosis not present

## 2017-01-28 LAB — HM DIABETES EYE EXAM

## 2017-02-03 ENCOUNTER — Encounter: Payer: Medicare Other | Admitting: Internal Medicine

## 2017-02-18 ENCOUNTER — Encounter: Payer: Self-pay | Admitting: Internal Medicine

## 2017-02-18 ENCOUNTER — Other Ambulatory Visit (INDEPENDENT_AMBULATORY_CARE_PROVIDER_SITE_OTHER): Payer: Medicare Other

## 2017-02-18 ENCOUNTER — Ambulatory Visit (INDEPENDENT_AMBULATORY_CARE_PROVIDER_SITE_OTHER): Payer: Medicare Other | Admitting: Internal Medicine

## 2017-02-18 VITALS — BP 128/78 | HR 92 | Temp 98.0°F | Ht 70.0 in | Wt 190.0 lb

## 2017-02-18 DIAGNOSIS — E118 Type 2 diabetes mellitus with unspecified complications: Secondary | ICD-10-CM | POA: Diagnosis not present

## 2017-02-18 DIAGNOSIS — M19012 Primary osteoarthritis, left shoulder: Secondary | ICD-10-CM | POA: Diagnosis not present

## 2017-02-18 DIAGNOSIS — Z0000 Encounter for general adult medical examination without abnormal findings: Secondary | ICD-10-CM | POA: Diagnosis not present

## 2017-02-18 DIAGNOSIS — E785 Hyperlipidemia, unspecified: Secondary | ICD-10-CM | POA: Diagnosis not present

## 2017-02-18 DIAGNOSIS — N400 Enlarged prostate without lower urinary tract symptoms: Secondary | ICD-10-CM

## 2017-02-18 LAB — CBC WITH DIFFERENTIAL/PLATELET
BASOS ABS: 0 10*3/uL (ref 0.0–0.1)
Basophils Relative: 0.6 % (ref 0.0–3.0)
EOS ABS: 0.3 10*3/uL (ref 0.0–0.7)
Eosinophils Relative: 4.7 % (ref 0.0–5.0)
HEMATOCRIT: 44.5 % (ref 39.0–52.0)
HEMOGLOBIN: 15 g/dL (ref 13.0–17.0)
LYMPHS PCT: 26.1 % (ref 12.0–46.0)
Lymphs Abs: 1.7 10*3/uL (ref 0.7–4.0)
MCHC: 33.8 g/dL (ref 30.0–36.0)
MCV: 91.7 fl (ref 78.0–100.0)
Monocytes Absolute: 0.5 10*3/uL (ref 0.1–1.0)
Monocytes Relative: 7.5 % (ref 3.0–12.0)
NEUTROS ABS: 3.9 10*3/uL (ref 1.4–7.7)
Neutrophils Relative %: 61.1 % (ref 43.0–77.0)
PLATELETS: 204 10*3/uL (ref 150.0–400.0)
RBC: 4.85 Mil/uL (ref 4.22–5.81)
RDW: 13.8 % (ref 11.5–15.5)
WBC: 6.4 10*3/uL (ref 4.0–10.5)

## 2017-02-18 LAB — COMPREHENSIVE METABOLIC PANEL
ALT: 17 U/L (ref 0–53)
AST: 16 U/L (ref 0–37)
Albumin: 4.7 g/dL (ref 3.5–5.2)
Alkaline Phosphatase: 60 U/L (ref 39–117)
BILIRUBIN TOTAL: 0.5 mg/dL (ref 0.2–1.2)
BUN: 20 mg/dL (ref 6–23)
CALCIUM: 9.4 mg/dL (ref 8.4–10.5)
CO2: 27 meq/L (ref 19–32)
CREATININE: 1 mg/dL (ref 0.40–1.50)
Chloride: 104 mEq/L (ref 96–112)
GFR: 78.17 mL/min (ref >60.00)
Glucose, Bld: 165 mg/dL — ABNORMAL HIGH (ref 70–99)
Potassium: 4.1 mEq/L (ref 3.5–5.1)
Sodium: 139 mEq/L (ref 135–145)
Total Protein: 7.4 g/dL (ref 6.0–8.3)

## 2017-02-18 LAB — URINALYSIS, ROUTINE W REFLEX MICROSCOPIC
Bilirubin Urine: NEGATIVE
HGB URINE DIPSTICK: NEGATIVE
KETONES UR: NEGATIVE
Leukocytes, UA: NEGATIVE
NITRITE: NEGATIVE
Specific Gravity, Urine: >=1.03 — AB (ref 1.000–1.030)
TOTAL PROTEIN, URINE-UPE24: NEGATIVE
URINE GLUCOSE: NEGATIVE
Urobilinogen, UA: 0.2 (ref 0.0–1.0)
pH: 5.5 (ref 5.0–8.0)

## 2017-02-18 LAB — HEMOGLOBIN A1C: Hgb A1c MFr Bld: 6.7 % — ABNORMAL HIGH (ref 4.6–6.5)

## 2017-02-18 LAB — LIPID PANEL
CHOL/HDL RATIO: 3
Cholesterol: 125 mg/dL (ref 0–200)
HDL: 35.9 mg/dL — ABNORMAL LOW (ref >39.00)
LDL CALC: 63 mg/dL (ref 0–99)
NONHDL: 89.58
Triglycerides: 134 mg/dL (ref 0.0–149.0)
VLDL: 26.8 mg/dL (ref 0.0–40.0)

## 2017-02-18 LAB — MICROALBUMIN / CREATININE URINE RATIO
Creatinine,U: 189 mg/dL
Microalb Creat Ratio: 1.2 mg/g (ref 0.0–30.0)
Microalb, Ur: 2.2 mg/dL — ABNORMAL HIGH (ref 0.0–1.9)

## 2017-02-18 LAB — PSA: PSA: 0.53 ng/mL (ref 0.10–4.00)

## 2017-02-18 LAB — TSH: TSH: 1.58 u[IU]/mL (ref 0.35–4.50)

## 2017-02-18 MED ORDER — TRAMADOL HCL 50 MG PO TABS: 50.0000 mg | ORAL_TABLET | Freq: Two times a day (BID) | ORAL | 5 refills | Status: DC | PRN

## 2017-02-18 MED ORDER — ATORVASTATIN CALCIUM 40 MG PO TABS: 40.0000 mg | ORAL_TABLET | Freq: Every day | ORAL | 3 refills | Status: DC

## 2017-02-18 NOTE — Patient Instructions (Signed)

## 2017-02-18 NOTE — Progress Notes (Signed)
Subjective:  Patient ID: Taylor Torres, male    DOB: 07-Jan-1946  Age: 71 y.o. MRN: [id number]  CC: Annual Exam; Hyperlipidemia; and Diabetes   HPI ARIUS HARNOIS presents for a CPX.  He complains of chronic, worsening left shoulder pain with decreased range of motion.  He denies any recent trauma or injury.  He takes Tylenol for the pain.  The pain keeps him awake at night and he cannot lay on his left shoulder.  He cannot take NSAIDs due to history of diverticular bleed.  He tells me his blood sugars have been well controlled and he denies any recent episodes of polys.  Past Medical History:  Diagnosis Date  . Alcoholism in family   . BPH (benign prostatic hypertrophy)   . Diabetes mellitus without complication ([place])   . Diverticulosis   . Family history of diabetes mellitus   . History of colonic polyps   . Hyperlipidemia    Past Surgical History:  Procedure Laterality Date  . COLONOSCOPY W/ POLYPECTOMY    . none      reports that  has never smoked. he has never used smokeless tobacco. He reports that he drinks about 1.2 oz of alcohol per week. He reports that he does not use drugs. family history includes Alcohol abuse in his other; Colon polyps in his father; Diabetes in his other; Esophageal cancer in his brother and brother. No Known Allergies  Outpatient Medications Prior to Visit  Medication Sig Dispense Refill  . B Complex-Biotin-FA (B-COMPLEX PO) Take 1 tablet by mouth daily.      . Cholecalciferol (VITAMIN D3) 1000 units CAPS Take 2,000 Units by mouth.    Marland Kitchen CINNAMON PO Take 1 capsule by mouth daily.    Marland Kitchen MAGNESIUM-ZINC PO Take 1 tablet by mouth daily.    . Multiple Vitamin (MULTIVITAMIN WITH MINERALS) TABS tablet Take 1 tablet by mouth daily.    Marland Kitchen atorvastatin (LIPITOR) 40 MG tablet Take 1 tablet (40 mg total) by mouth daily. 90 tablet 3   No facility-administered medications prior to visit.     ROS Review of Systems  Constitutional: Negative.  Negative for  appetite change, diaphoresis, fatigue and unexpected weight change.  HENT: Negative.   Eyes: Negative for visual disturbance.  Respiratory: Negative for cough, chest tightness, shortness of breath and wheezing.   Cardiovascular: Negative for chest pain, palpitations and leg swelling.  Gastrointestinal: Negative.  Negative for abdominal pain, constipation, diarrhea, nausea and vomiting.  Endocrine: Negative for polydipsia, polyphagia and polyuria.  Genitourinary: Negative.  Negative for difficulty urinating, dysuria, flank pain, frequency, penile swelling, scrotal swelling, testicular pain and urgency.  Musculoskeletal: Negative.  Negative for back pain and myalgias.  Skin: Negative.   Allergic/Immunologic: Negative.   Neurological: Negative.  Negative for dizziness, weakness and headaches.  Hematological: Negative for adenopathy. Does not bruise/bleed easily.  Psychiatric/Behavioral: Negative.     Objective:  BP 128/78 (BP Location: Left Arm, Patient Position: Sitting, Cuff Size: Normal)   Pulse 92   Temp 98 F (36.7 C) (Oral)   Ht 5\' 10"  (1.778 m)   Wt 190 lb (86.2 kg)   SpO2 98%   BMI 27.26 kg/m   BP Readings from Last 3 Encounters:  02/18/17 128/78  07/30/16 130/80  07/28/16 (!) 145/77    Wt Readings from Last 3 Encounters:  02/18/17 190 lb (86.2 kg)  07/30/16 181 lb 8 oz (82.3 kg)  07/28/16 183 lb 13.8 oz (83.4 kg)    Physical Exam  Constitutional: He is oriented to person, place, and time. No distress.  HENT:  Mouth/Throat: Oropharynx is clear and moist. No oropharyngeal exudate.  Eyes: Conjunctivae are normal. Right eye exhibits no discharge. Left eye exhibits no discharge. No scleral icterus.  Neck: Normal range of motion. Neck supple. No JVD present. No thyromegaly present.  Cardiovascular: Normal rate, regular rhythm and normal heart sounds.  No murmur heard. Pulmonary/Chest: Effort normal and breath sounds normal. No respiratory distress. He has no rales.    Abdominal: Bowel sounds are normal. He exhibits no distension and no mass. There is no rebound. Hernia confirmed negative in the right inguinal area.  Genitourinary: Testes normal and penis normal. Right testis shows no mass. Left testis shows no mass. Circumcised. No penile erythema or penile tenderness. No discharge found.  Musculoskeletal: He exhibits no edema, tenderness or deformity.       Left shoulder: He exhibits decreased range of motion. He exhibits no tenderness, no swelling, no effusion, no crepitus and no deformity.  Lymphadenopathy:    He has no cervical adenopathy.       Right: No inguinal adenopathy present.       Left: No inguinal adenopathy present.  Neurological: He is alert and oriented to person, place, and time.  Skin: Skin is warm and dry. No rash noted. He is not diaphoretic. No erythema. No pallor.  Psychiatric: He has a normal mood and affect. His behavior is normal. Judgment and thought content normal.  Vitals reviewed.   Lab Results  Component Value Date   WBC 6.4 02/18/2017   HGB 15.0 02/18/2017   HCT 44.5 02/18/2017   PLT 204.0 02/18/2017   GLUCOSE 165 (H) 02/18/2017   CHOL 125 02/18/2017   TRIG 134.0 02/18/2017   HDL 35.90 (L) 02/18/2017   LDLDIRECT 87.0 02/03/2016   LDLCALC 63 02/18/2017   ALT 17 02/18/2017   AST 16 02/18/2017   NA 139 02/18/2017   K 4.1 02/18/2017   CL 104 02/18/2017   CREATININE 1.00 02/18/2017   BUN 20 02/18/2017   CO2 27 02/18/2017   TSH 1.58 02/18/2017   PSA 0.53 02/18/2017   HGBA1C 6.7 (H) 02/18/2017   MICROALBUR 2.2 (H) 02/18/2017    No results found.  Assessment & Plan:   Pryce was seen today for annual exam, hyperlipidemia and diabetes.  Diagnoses and all orders for this visit:  Type 2 diabetes mellitus with complication, without long-term current use of insulin ([place])- His A1c is at 6.7%.  His blood sugars are adequately well controlled with no medications.  He was encouraged to improve his lifestyle  modifications. -     Comprehensive metabolic panel; Future -     CBC with Differential/Platelet; Future -     Hemoglobin A1c; Future -     Microalbumin / creatinine urine ratio; Future -     Discontinue: atorvastatin (LIPITOR) 40 MG tablet; Take 1 tablet (40 mg total) by mouth daily. -     atorvastatin (LIPITOR) 40 MG tablet; Take 1 tablet (40 mg total) by mouth daily.  BPH without obstruction/lower urinary tract symptoms- His PSA remains low so I am not concerned about prostate cancer.  He has no symptoms that need to be treated. -     Urinalysis, Routine w reflex microscopic; Future -     PSA; Future  Hyperlipidemia with target LDL less than 130- He has achieved his LDL goal is doing well on the statin. -     Lipid panel; Future -  TSH; Future -     Discontinue: atorvastatin (LIPITOR) 40 MG tablet; Take 1 tablet (40 mg total) by mouth daily. -     atorvastatin (LIPITOR) 40 MG tablet; Take 1 tablet (40 mg total) by mouth daily.  Routine general medical examination at a health care facility  Primary osteoarthritis of left shoulder -     traMADol (ULTRAM) 50 MG tablet; Take 1 tablet (50 mg total) by mouth every 12 (twelve) hours as needed. -     Ambulatory referral to Sports Medicine   I am having [name]. Demps start on traMADol. I am also having him maintain his B Complex-Biotin-FA (B-COMPLEX PO), MAGNESIUM-ZINC PO, Vitamin D3, multivitamin with minerals, CINNAMON PO, and atorvastatin.  Meds ordered this encounter  Medications  . DISCONTD: atorvastatin (LIPITOR) 40 MG tablet    Sig: Take 1 tablet (40 mg total) by mouth daily.    Dispense:  90 tablet    Refill:  3  . traMADol (ULTRAM) 50 MG tablet    Sig: Take 1 tablet (50 mg total) by mouth every 12 (twelve) hours as needed.    Dispense:  60 tablet    Refill:  5  . atorvastatin (LIPITOR) 40 MG tablet    Sig: Take 1 tablet (40 mg total) by mouth daily.    Dispense:  90 tablet    Refill:  3   See AVS for instructions about  healthy living and anticipatory guidance.  Follow-up: Return in about 6 months (around 08/19/2017).  Scarlette Calico, MD

## 2017-02-20 NOTE — Assessment & Plan Note (Signed)

## 2017-02-24 ENCOUNTER — Other Ambulatory Visit: Payer: Self-pay | Admitting: Internal Medicine

## 2017-02-24 DIAGNOSIS — E118 Type 2 diabetes mellitus with unspecified complications: Secondary | ICD-10-CM

## 2017-02-24 MED ORDER — METFORMIN HCL ER 750 MG PO TB24: 1500.0000 mg | ORAL_TABLET | Freq: Every day | ORAL | 1 refills | Status: DC

## 2017-03-09 ENCOUNTER — Ambulatory Visit (INDEPENDENT_AMBULATORY_CARE_PROVIDER_SITE_OTHER)
Admission: RE | Admit: 2017-03-09 | Discharge: 2017-03-09 | Disposition: A | Discharge status: Home / Self Care | Payer: Medicare Other | Source: Ambulatory Visit | Attending: Family Medicine | Admitting: Family Medicine

## 2017-03-09 ENCOUNTER — Encounter: Payer: Self-pay | Admitting: Family Medicine

## 2017-03-09 ENCOUNTER — Ambulatory Visit: Payer: Medicare Other | Admitting: Family Medicine

## 2017-03-09 VITALS — BP 130/68 | HR 70 | Ht 69.0 in | Wt 192.0 lb

## 2017-03-09 DIAGNOSIS — M75122 Complete rotator cuff tear or rupture of left shoulder, not specified as traumatic: Secondary | ICD-10-CM | POA: Diagnosis not present

## 2017-03-09 DIAGNOSIS — G8929 Other chronic pain: Secondary | ICD-10-CM

## 2017-03-09 DIAGNOSIS — M25512 Pain in left shoulder: Secondary | ICD-10-CM

## 2017-03-09 DIAGNOSIS — M19012 Primary osteoarthritis, left shoulder: Secondary | ICD-10-CM | POA: Diagnosis not present

## 2017-03-09 DIAGNOSIS — M75102 Unspecified rotator cuff tear or rupture of left shoulder, not specified as traumatic: Secondary | ICD-10-CM | POA: Insufficient documentation

## 2017-03-09 NOTE — Patient Instructions (Signed)
Good to see you  Rotator cuff tear  Exercises 3 times a week.  pennsaid pinkie amount topically 2 times daily as needed.  Ice 20 minutes 2 times daily. Usually after activity and before bed. Keep hands within peripheral vision.  See me again in 3 weeks and we will discuss injections, PT or MRI if not better

## 2017-03-09 NOTE — Assessment & Plan Note (Signed)
Complete tear of the rotator cuff.  Given home exercise, discussed icing regimen.  X-rays ordered today to further evaluate for the underlying arthritis.  We discussed which activities to do which wants to avoid.  Topical anti-inflammatories given.  Follow-up again in 4 weeks for further evaluation.

## 2017-03-09 NOTE — Progress Notes (Signed)
[name] [address], [place] [address] Phone: [phone] Subjective:     CC: left shoulder pain   GBT:[id number]  Taylor Torres is a 72 y.o. male coming in with complaint of left shoulder pain. Pain radiates down to elbow. He has noted weakness that he believes is his way of avoiding pain. Can't take NSAIDs.    Onset- 1 year Location- lateral shoulder  Duration-  Character- Achy  Aggravating factors- sleeping on left side, adduction, flexion overhead  Reliving factors- Tramadol 40 mg Therapies tried-  Severity-5 out of 10     Past Medical History:  Diagnosis Date  . Alcoholism in family   . BPH (benign prostatic hypertrophy)   . Diabetes mellitus without complication ([place])   . Diverticulosis   . Family history of diabetes mellitus   . History of colonic polyps   . Hyperlipidemia    Past Surgical History:  Procedure Laterality Date  . COLONOSCOPY W/ POLYPECTOMY    . none     Social History   Socioeconomic History  . Marital status: Married    Spouse name: None  . Number of children: 2  . Years of education: None  . Highest education level: None  Social Needs  . Financial resource strain: None  . Food insecurity - worry: None  . Food insecurity - inability: None  . Transportation needs - medical: None  . Transportation needs - non-medical: None  Occupational History  . Occupation: Retired  Tobacco Use  . Smoking status: Never Smoker  . Smokeless tobacco: Never Used  . Tobacco comment: Regular exercise - Yes  Substance and Sexual Activity  . Alcohol use: Yes    Alcohol/week: 1.2 oz    Types: 2 Glasses of wine per week    Comment: 2 in a week   . Drug use: No  . Sexual activity: Not Currently  Other Topics Concern  . None  Social History Narrative  . None   No Known Allergies Family History  Problem Relation Age of Onset  . Alcohol abuse Other   . Diabetes Other   . Esophageal cancer Brother    Dead  . Esophageal cancer Brother        alive  . Colon polyps Father   . Heart disease Neg Hx   . Hyperlipidemia Neg Hx   . Hypertension Neg Hx   . Kidney disease Neg Hx   . Stroke Neg Hx   . Early death Neg Hx   . Colon cancer Neg Hx   . Gallbladder disease Neg Hx      Past medical history, social, surgical and family history all reviewed in electronic medical record.  No pertanent information unless stated regarding to the chief complaint.   Review of Systems:Review of systems updated and as accurate as of 03/09/17  No headache, visual changes, nausea, vomiting, diarrhea, constipation, dizziness, abdominal pain, skin rash, fevers, chills, night sweats, weight loss, swollen lymph nodes, body aches, joint swelling,  chest pain, shortness of breath, mood changes.  Positive muscle aches  Objective  Blood pressure 130/68, pulse 70, height 5\' 9"  (1.753 m), weight 192 lb (87.1 kg), SpO2 96 %. Systems examined below as of 03/09/17   General: No apparent distress alert and oriented x3 mood and affect normal, dressed appropriately.  HEENT: Pupils equal, extraocular movements intact  Respiratory: Patient's speak in full sentences and does not appear short of breath  Cardiovascular: No lower extremity edema, non  tender, no erythema  Skin: Warm dry intact with no signs of infection or rash on extremities or on axial skeleton.  Abdomen: Soft nontender  Neuro: Cranial nerves II through XII are intact, neurovascularly intact in all extremities with 2+ DTRs and 2+ pulses.  Lymph: No lymphadenopathy of posterior or anterior cervical chain or axillae bilaterally.  Gait normal with good balance and coordination.  MSK:  Non tender with full range of motion and good stability and symmetric strength and tone of  elbows, wrist, hip, knee and ankles bilaterally.  Shoulder: left Inspection reveals no abnormalities, atrophy or asymmetry. Palpation is normal with no tenderness over AC joint or bicipital  groove. ROM is full in all planes passively. Rotator cuff strength normal throughout. signs of impingement with positive Neer and Hawkin's tests, but negative empty can sign. Speeds and Yergason's tests normal. No labral pathology noted with negative Obrien's, negative clunk and good stability. Normal scapular function observed. No painful arc and no drop arm sign. No apprehension sign  MSK US performed of: left This study was ordered, performed, and interpreted by Charlann Boxer D.O.  Shoulder:   Supraspinatus: Large tear noted with 1.6 cm of retraction. Subscapularis:  Appears normal on long and transverse views. Positive bursa AC joint: Mild Glenohumeral Joint:  Appears normal without effusion. Glenoid Labrum:  Intact without visualized tears. Biceps Tendon:  Appears normal on long and transverse views, no fraying of tendon, tendon located in intertubercular groove, no subluxation with shoulder internal or external rotation.  Impression: rotator cuff tear mild underlying arthritis      Impression and Recommendations:     This case required medical decision making of moderate complexity.      Note: This dictation was prepared with Dragon dictation along with smaller phrase technology. Any transcriptional errors that result from this process are unintentional.

## 2017-03-31 NOTE — Progress Notes (Signed)
[name] [place], [place] [address] Phone: [phone] Subjective:     CC: Left shoulder pain  [id number]  Taylor Torres is a 72 y.o. male coming in with complaint of left shoulder pain. He has pain at night but during the day his pain has decreased unless he over extends his arm. He did not take Pennsaid as prescribe last visit.  Patient at last exam was found to have more of a rotator cuff tear with some mild retraction.  Patient did have x-rays that were independently visualized by me showing moderate osteoarthritic changes.      Past Medical History:  Diagnosis Date  . Alcoholism in family   . BPH (benign prostatic hypertrophy)   . Diabetes mellitus without complication ([place])   . Diverticulosis   . Family history of diabetes mellitus   . History of colonic polyps   . Hyperlipidemia    Past Surgical History:  Procedure Laterality Date  . COLONOSCOPY W/ POLYPECTOMY    . none     Social History   Socioeconomic History  . Marital status: Married    Spouse name: None  . Number of children: 2  . Years of education: None  . Highest education level: None  Social Needs  . Financial resource strain: None  . Food insecurity - worry: None  . Food insecurity - inability: None  . Transportation needs - medical: None  . Transportation needs - non-medical: None  Occupational History  . Occupation: Retired  Tobacco Use  . Smoking status: Never Smoker  . Smokeless tobacco: Never Used  . Tobacco comment: Regular exercise - Yes  Substance and Sexual Activity  . Alcohol use: Yes    Alcohol/week: 1.2 oz    Types: 2 Glasses of wine per week    Comment: 2 in a week   . Drug use: No  . Sexual activity: Not Currently  Other Topics Concern  . None  Social History Narrative  . None   No Known Allergies Family History  Problem Relation Age of Onset  . Alcohol abuse Other   . Diabetes Other   . Esophageal cancer Brother     Dead  . Esophageal cancer Brother        alive  . Colon polyps Father   . Heart disease Neg Hx   . Hyperlipidemia Neg Hx   . Hypertension Neg Hx   . Kidney disease Neg Hx   . Stroke Neg Hx   . Early death Neg Hx   . Colon cancer Neg Hx   . Gallbladder disease Neg Hx      Past medical history, social, surgical and family history all reviewed in electronic medical record.  No pertanent information unless stated regarding to the chief complaint.   Review of Systems:Review of systems updated and as accurate as of 04/01/17  No headache, visual changes, nausea, vomiting, diarrhea, constipation, dizziness, abdominal pain, skin rash, fevers, chills, night sweats, weight loss, swollen lymph nodes, body aches, joint swelling, chest pain, shortness of breath, mood changes.  Positive muscle aches  Objective  Blood pressure 126/76, pulse (!) 103, height 5\' 9"  (1.753 m), weight 187 lb (84.8 kg), SpO2 97 %. Systems examined below as of 04/01/17   General: No apparent distress alert and oriented x3 mood and affect normal, dressed appropriately.  HEENT: Pupils equal, extraocular movements intact  Respiratory: Patient's speak in full sentences and does not appear short of breath  Cardiovascular:  No lower extremity edema, non tender, no erythema  Skin: Warm dry intact with no signs of infection or rash on extremities or on axial skeleton.  Abdomen: Soft nontender  Neuro: Cranial nerves II through XII are intact, neurovascularly intact in all extremities with 2+ DTRs and 2+ pulses.  Lymph: No lymphadenopathy of posterior or anterior cervical chain or axillae bilaterally.  Gait normal with good balance and coordination.  MSK:  Non tender with full range of motion and good stability and symmetric strength and tone of  elbows, wrist, hip, knee and ankles bilaterally.  Shoulder: Left Inspection reveals no abnormalities, atrophy or asymmetry. Palpation is normal with no tenderness over AC joint or  bicipital groove. ROM lacks last 5 degrees of external rotation.  Internal rotation to sacrum Rotator cuff strength 4 out of 5 compared to contralateral side but improved Positive impingement  Speeds and Yergason's tests positive Positive labral pathology Normal scapular function observed. No painful arc and no drop arm sign. No apprehension sign Contralateral shoulder unremarkable  MSK US performed of: Left shoulder This study was ordered, performed, and interpreted by Charlann Boxer D.O.  Shoulder:   Moderate osteoarthritic changes noted.  Patient does have some mild interval healing of the supraspinatus tear noted.  Still seems to be high with some retraction. Impression: Mild improvement with interval healing of a rotator cuff tear but still high-grade tear noted       Impression and Recommendations:     This case required medical decision making of moderate complexity.      Note: This dictation was prepared with Dragon dictation along with smaller phrase technology. Any transcriptional errors that result from this process are unintentional.

## 2017-04-01 ENCOUNTER — Ambulatory Visit: Payer: Self-pay

## 2017-04-01 ENCOUNTER — Ambulatory Visit: Payer: Medicare Other | Admitting: Family Medicine

## 2017-04-01 ENCOUNTER — Encounter: Payer: Self-pay | Admitting: Family Medicine

## 2017-04-01 VITALS — BP 126/76 | HR 103 | Ht 69.0 in | Wt 187.0 lb

## 2017-04-01 DIAGNOSIS — M25512 Pain in left shoulder: Secondary | ICD-10-CM | POA: Diagnosis not present

## 2017-04-01 DIAGNOSIS — G8929 Other chronic pain: Secondary | ICD-10-CM

## 2017-04-01 DIAGNOSIS — M75122 Complete rotator cuff tear or rupture of left shoulder, not specified as traumatic: Secondary | ICD-10-CM | POA: Diagnosis not present

## 2017-04-01 NOTE — Assessment & Plan Note (Signed)
Seems to be a high-grade tear.  Discussed with patient in great length about icing regimen and home exercises.  Patient will be sent to formal physical therapy.  Wants to continue with conservative therapy.  Worsening symptoms because patient has moderate osteoarthritic changes we would consider an MRI to further evaluate to see if patient could have surgical repair.  Worsening symptoms we could also consider possible injections or PRP.  Patient will consider this.  Follow-up again in 4-6 weeks

## 2017-04-01 NOTE — Patient Instructions (Addendum)
Good to see you  Alvera Singh is your friend.  PT will be calling you  Can't wait to hear about the cruise Xray did show moderate arthritis.  If not better we will consider MRI or injection  See me again in 4-6 weeks

## 2017-04-15 ENCOUNTER — Encounter: Payer: Self-pay | Admitting: Family Medicine

## 2017-04-15 DIAGNOSIS — M75102 Unspecified rotator cuff tear or rupture of left shoulder, not specified as traumatic: Secondary | ICD-10-CM

## 2017-04-26 NOTE — Progress Notes (Signed)
[name] [place], [place] [address] Phone: [phone] Subjective:    CC: Shoulder pain follow-up  [id number]  Taylor Torres is a 72 y.o. male coming in with complaint of not have a left-sided rotator cuff tear.  Sent to physical therapy.  Has been doing home exercises, icing regimen, topical anti-inflammatories.  Patient states has not done the physical therapy yet.  Patient continues to have pain overall.  Still states that there is weakness as well.   Patient also has x-rays of the left shoulder showing moderate osteoarthritic changes.  Ultrasound did show a high-grade tear  Past Medical History:  Diagnosis Date  . Alcoholism in family   . BPH (benign prostatic hypertrophy)   . Diabetes mellitus without complication ([place])   . Diverticulosis   . Family history of diabetes mellitus   . History of colonic polyps   . Hyperlipidemia    Past Surgical History:  Procedure Laterality Date  . COLONOSCOPY W/ POLYPECTOMY    . none     Social History   Socioeconomic History  . Marital status: Married    Spouse name: None  . Number of children: 2  . Years of education: None  . Highest education level: None  Social Needs  . Financial resource strain: None  . Food insecurity - worry: None  . Food insecurity - inability: None  . Transportation needs - medical: None  . Transportation needs - non-medical: None  Occupational History  . Occupation: Retired  Tobacco Use  . Smoking status: Never Smoker  . Smokeless tobacco: Never Used  . Tobacco comment: Regular exercise - Yes  Substance and Sexual Activity  . Alcohol use: Yes    Alcohol/week: 1.2 oz    Types: 2 Glasses of wine per week    Comment: 2 in a week   . Drug use: No  . Sexual activity: Not Currently  Other Topics Concern  . None  Social History Narrative  . None   No Known Allergies Family History  Problem Relation Age of Onset  . Alcohol abuse Other   . Diabetes  Other   . Esophageal cancer Brother        Dead  . Esophageal cancer Brother        alive  . Colon polyps Father   . Heart disease Neg Hx   . Hyperlipidemia Neg Hx   . Hypertension Neg Hx   . Kidney disease Neg Hx   . Stroke Neg Hx   . Early death Neg Hx   . Colon cancer Neg Hx   . Gallbladder disease Neg Hx      Past medical history, social, surgical and family history all reviewed in electronic medical record.  No pertanent information unless stated regarding to the chief complaint.   Review of Systems:Review of systems updated and as accurate as of 04/27/17  No headache, visual changes, nausea, vomiting, diarrhea, constipation, dizziness, abdominal pain, skin rash, fevers, chills, night sweats, weight loss, swollen lymph nodes, body aches, joint swelling, chest pain, shortness of breath, mood changes.  Positive muscle aches  Objective  Blood pressure 120/70, pulse 83, height 5\' 9"  (1.753 m), weight 187 lb (84.8 kg), SpO2 97 %. Systems examined below as of 04/27/17   General: No apparent distress alert and oriented x3 mood and affect normal, dressed appropriately.  HEENT: Pupils equal, extraocular movements intact  Respiratory: Patient's speak in full sentences and does not appear short of breath  Cardiovascular: No lower extremity edema, non tender, no erythema  Skin: Warm dry intact with no signs of infection or rash on extremities or on axial skeleton.  Abdomen: Soft nontender  Neuro: Cranial nerves II through XII are intact, neurovascularly intact in all extremities with 2+ DTRs and 2+ pulses.  Lymph: No lymphadenopathy of posterior or anterior cervical chain or axillae bilaterally.  Gait normal with good balance and coordination.  MSK:  Non tender with full range of motion and good stability and symmetric strength and tone of  elbows, wrist, hip, knee and ankles bilaterally.  Shoulder: Left Inspection reveals mild increase in atrophy noted. Palpation is normal with no  tenderness over AC joint or bicipital groove. Starting to lose range of motion with forward flexion of 160 degrees, external rotation of 10 degrees with mild crepitus. Rotator cuff strength 3 out of 5 and worsening from previous exam Positive impingement Speeds and Yergason's tests normal. O'Brien's positive Normal scapular function observed. Positive painful arc but negative drop arm No apprehension sign Contralateral shoulder unremarkable    Impression and Recommendations:     This case required medical decision making of moderate complexity.      Note: This dictation was prepared with Dragon dictation along with smaller phrase technology. Any transcriptional errors that result from this process are unintentional.

## 2017-04-27 ENCOUNTER — Encounter: Payer: Self-pay | Admitting: Family Medicine

## 2017-04-27 ENCOUNTER — Ambulatory Visit: Payer: Medicare Other | Admitting: Family Medicine

## 2017-04-27 DIAGNOSIS — L814 Other melanin hyperpigmentation: Secondary | ICD-10-CM | POA: Diagnosis not present

## 2017-04-27 DIAGNOSIS — B078 Other viral warts: Secondary | ICD-10-CM | POA: Diagnosis not present

## 2017-04-27 DIAGNOSIS — M75102 Unspecified rotator cuff tear or rupture of left shoulder, not specified as traumatic: Secondary | ICD-10-CM

## 2017-04-27 DIAGNOSIS — L821 Other seborrheic keratosis: Secondary | ICD-10-CM | POA: Diagnosis not present

## 2017-04-27 DIAGNOSIS — D1801 Hemangioma of skin and subcutaneous tissue: Secondary | ICD-10-CM | POA: Diagnosis not present

## 2017-04-27 DIAGNOSIS — D225 Melanocytic nevi of trunk: Secondary | ICD-10-CM | POA: Diagnosis not present

## 2017-04-27 NOTE — Assessment & Plan Note (Signed)
Patient does have arthritis of the shoulder.  I do believe the patient has a high-grade rotator cuff tear as well.  Encourage patient to continue with conservative therapy but I do feel advanced imaging is warranted at this time.  This would be because we would like evaluation for potential arthroscopic procedure.  This could change management.  Too much arthritis we have discussed with patient that possible shoulder replacement would be the only thing necessary then at that time.  Patient of course would like to avoid any large scale surgeries.  We discussed with patient about other potential injections and patient will consider PRP.  Patient will follow-up after the advanced imaging.

## 2017-04-27 NOTE — Patient Instructions (Signed)
Good to see you  I am sorry for the bad news Lets get the MRI and make sure we can do this with PT or if we need a surgeon to discuss.  Call [phone]  I will discuss with you once it is done.

## 2017-04-29 ENCOUNTER — Ambulatory Visit: Payer: Medicare Other | Admitting: Family Medicine

## 2017-05-03 ENCOUNTER — Other Ambulatory Visit: Payer: Self-pay | Admitting: *Deleted

## 2017-05-03 DIAGNOSIS — M25512 Pain in left shoulder: Secondary | ICD-10-CM

## 2017-05-04 ENCOUNTER — Other Ambulatory Visit: Payer: Self-pay

## 2017-05-04 ENCOUNTER — Encounter: Payer: Self-pay | Admitting: Family Medicine

## 2017-05-04 ENCOUNTER — Encounter: Payer: Self-pay | Admitting: Physical Therapy

## 2017-05-04 ENCOUNTER — Ambulatory Visit: Payer: Medicare Other | Attending: Family Medicine | Admitting: Physical Therapy

## 2017-05-04 DIAGNOSIS — M25512 Pain in left shoulder: Secondary | ICD-10-CM | POA: Diagnosis not present

## 2017-05-04 DIAGNOSIS — M436 Torticollis: Secondary | ICD-10-CM | POA: Diagnosis not present

## 2017-05-04 DIAGNOSIS — M25612 Stiffness of left shoulder, not elsewhere classified: Secondary | ICD-10-CM | POA: Diagnosis not present

## 2017-05-04 DIAGNOSIS — M62838 Other muscle spasm: Secondary | ICD-10-CM | POA: Insufficient documentation

## 2017-05-04 NOTE — Therapy (Signed)
[facility] [place], [place], [address] Phone: [phone]   Fax:  [phone]  Physical Therapy Evaluation  Patient Details  Name: Taylor Torres MRN: [id number] Date of Birth: [birth year] Referring Provider: Charlann Boxer   Encounter Date: 05/04/2017  PT End of Session - 05/04/17 1328    Visit Number  1    Date for PT Re-Evaluation  07/04/17    PT Start Time  0930    PT Stop Time  1607    PT Time Calculation (min)  45 min    Activity Tolerance  Patient tolerated treatment well    Behavior During Therapy  [facility] for tasks assessed/performed       Past Medical History:  Diagnosis Date  . Alcoholism in family   . BPH (benign prostatic hypertrophy)   . Diabetes mellitus without complication ([place])   . Diverticulosis   . Family history of diabetes mellitus   . History of colonic polyps   . Hyperlipidemia     Past Surgical History:  Procedure Laterality Date  . COLONOSCOPY W/ POLYPECTOMY    . none      There were no vitals filed for this visit.   Subjective Assessment - 05/04/17 0932    Subjective  Patient x-rays show arthritic changes, an US revealed a RC tear.  He is unsure of a specific cause.  Reports going on for about a year, reports getting gradually worse.  He is right handed.      Limitations  Lifting;House hold activities    Patient Stated Goals  have no pain, better ROM    Currently in Pain?  Yes    Pain Score  0-No pain    Pain Location  Shoulder    Pain Orientation  Left    Pain Descriptors / Indicators  Aching    Pain Type  Acute pain    Pain Onset  More than a month ago    Pain Frequency  Intermittent    Aggravating Factors   reaching, dressing, pain up to 5-6/10    Pain Relieving Factors  rest no pain    Effect of Pain on Daily Activities  reports difficulty sleeping and difficulty dressing         [facility] PT Assessment - 05/04/17 0001      Assessment   Medical Diagnosis  left  RC tear    Referring Provider  Charlann Boxer    Onset Date/Surgical Date  04/06/17    Hand Dominance  Right    Prior Therapy  no      Precautions   Precautions  None      Balance Screen   Has the patient fallen in the past 6 months  No    Has the patient had a decrease in activity level because of a fear of falling?   No    Is the patient reluctant to leave their home because of a fear of falling?   No      Home Environment   Additional Comments  does some yardwork      Prior Function   Level of Independence  Independent    Vocation  Retired    Leisure  IT consultant Comments  fwd head, rounded shoulders      ROM / Strength   AROM / PROM / Strength  AROM;PROM;Strength      AROM  Overall AROM Comments  Cervical ROM is decreased 75%    AROM Assessment Site  --    Right/Left Shoulder  Left    Left Shoulder Flexion  100 Degrees    Left Shoulder ABduction  100 Degrees    Left Shoulder Internal Rotation  15 Degrees    Left Shoulder External Rotation  65 Degrees      PROM   Overall PROM Comments  all passive motions cause pain, IR caused the most pain    PROM Assessment Site  Shoulder    Right/Left Shoulder  Left    Left Shoulder Flexion  110 Degrees    Left Shoulder ABduction  110 Degrees    Left Shoulder Internal Rotation  20 Degrees    Left Shoulder External Rotation  75 Degrees      Strength   Overall Strength Comments  Strength 4-/5 for all, some pain with this      Palpation   Palpation comment  he is very tight in the cervical spine mms , non tender      Special Tests   Other special tests  had some strength with the emoty can, rated a 4-/5, mild pain             Objective measurements completed on examination: See above findings.              PT Education - 05/04/17 0956    Education provided  Yes    Education Details  AAROM/PROM of the shoulder    Person(s) Educated  Patient    Methods   Explanation;Demonstration;Handout    Comprehension  Verbalized understanding       PT Short Term Goals - 05/04/17 1332      PT SHORT TERM GOAL #1   Title  independent with initial HEP    Time  2    Period  Weeks    Status  New        PT Long Term Goals - 05/04/17 1332      PT LONG TERM GOAL #1   Title  decrease pain 50%    Time  8    Period  Weeks    Status  New      PT LONG TERM GOAL #2   Title  understand proper posture and body mechanics    Time  8    Period  Weeks    Status  New      PT LONG TERM GOAL #3   Title  increase AROM of the left shoulder flexion to 130 degrees    Time  8    Period  Weeks    Status  New      PT LONG TERM GOAL #4   Title  increase AROM of the left shoulder IR to 45 degrees    Time  8    Period  Weeks    Status  New      PT LONG TERM GOAL #5   Title  increase cervical ROM 25%    Time  8    Period  Weeks    Status  New             Plan - 05/04/17 1329    Clinical Impression Statement  Patient reports left shoulder pain for about a year.  He is right handed, x-rays show arthritis cahanges, US showed an RC tear.  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  His left shoulder AROM was 100 degrees flexion, 90 degrees abduction and 15 degrees IR.  He is very tight in the mms of the neck and the upper traps.  Reports difficulty reaching behind him with the left arm.  Had strength with empty can rated 4-/5 with mild pain    Clinical Presentation  Stable    Clinical Decision Making  Low    Rehab Potential  Good    PT Frequency  2x / week    PT Duration  8 weeks    PT Treatment/Interventions  ADLs/Self Care Home Management;Iontophoresis 4mg /ml Dexamethasone;Moist Heat;Ultrasound;Cryotherapy;Electrical Stimulation;Therapeutic activities;Therapeutic exercise;Neuromuscular re-education;Manual techniques;Patient/family education    PT  Next Visit Plan  progress stretching and functional strength, he may get MRI next week    Consulted and Agree with Plan of Care  Patient       Patient will benefit from skilled therapeutic intervention in order to improve the following deficits and impairments:  Decreased range of motion, Increased muscle spasms, Pain, Impaired UE functional use, Impaired flexibility, Improper body mechanics, Postural dysfunction, Decreased strength  Visit Diagnosis: Acute pain of left shoulder - Plan: PT plan of care cert/re-cert  Stiffness of left shoulder, not elsewhere classified - Plan: PT plan of care cert/re-cert  Other muscle spasm - Plan: PT plan of care cert/re-cert  Stiffness of cervical spine - Plan: PT plan of care cert/re-cert     Problem List Patient Active Problem List   Diagnosis Date Noted  . Rotator cuff tear, left 03/09/2017  . Primary osteoarthritis of left shoulder 02/18/2017  . History of GI diverticular bleed 12/18/2015  . Type 2 diabetes mellitus with complication, without long-term current use of insulin ([place]) 12/06/2014  . Esophageal reflux   . Dysphagia, pharyngoesophageal phase 02/05/2014  . Routine general medical examination at a health care facility 01/12/2011  . Hyperlipidemia with target LDL less than 130 11/16/2008  . BPH without obstruction/lower urinary tract symptoms 11/16/2008  . COLONIC POLYPS, HX OF 11/16/2008    Sumner Boast., PT 05/04/2017, 1:36 PM  [place], [place], [address] Phone: [phone]   Fax:  [phone]  Name: Taylor Torres MRN: [id number] Date of Birth: 1945-07-31

## 2017-05-14 ENCOUNTER — Encounter: Payer: Medicare Other | Admitting: Physical Therapy

## 2017-05-18 ENCOUNTER — Ambulatory Visit
Admission: RE | Admit: 2017-05-18 | Discharge: 2017-05-18 | Disposition: A | Discharge status: Home / Self Care | Payer: Medicare Other | Source: Ambulatory Visit | Attending: Family Medicine | Admitting: Family Medicine

## 2017-05-18 DIAGNOSIS — M25512 Pain in left shoulder: Secondary | ICD-10-CM

## 2017-05-18 DIAGNOSIS — S46012A Strain of muscle(s) and tendon(s) of the rotator cuff of left shoulder, initial encounter: Secondary | ICD-10-CM | POA: Diagnosis not present

## 2017-05-18 MED ORDER — IOPAMIDOL (ISOVUE-M 200) INJECTION 41%
15.0000 mL | Freq: Once | INTRAMUSCULAR | Status: AC
  Administered 2017-05-18: 15 mL via INTRA_ARTICULAR

## 2017-06-22 ENCOUNTER — Encounter: Payer: Self-pay | Admitting: Family Medicine

## 2017-06-30 ENCOUNTER — Other Ambulatory Visit: Payer: Self-pay | Admitting: Internal Medicine

## 2017-06-30 DIAGNOSIS — E118 Type 2 diabetes mellitus with unspecified complications: Secondary | ICD-10-CM

## 2017-07-06 NOTE — Progress Notes (Signed)
[facility] [place], [place] [address] Phone: [phone] Subjective:    I'm seeing this patient by the request  of:    CC: Left shoulder pain follow-up  [id number]  Taylor Torres is a 72 y.o. male coming in with complaint of shoulder pain. He continues to have pain daily in his shoulder and is here to discuss the next step in his treatment.  Patient's left shoulder did have an MRI.  MRI was independently visualized by me.  Patient did have excessive small partial tearing of multiple tendons within the rotator cuff as well as severe osteoarthritic changes of the acromioclavicular joint and mild to moderate arthritic changes of the left shoulder.  Patient states that he has made some very mild improvement overall.  Can do daily activities but still can have sharp pain from time to time and continues to be very uncomfortable at night.  Continues to topical anti-inflammatories and home exercises.     Past Medical History:  Diagnosis Date  . Alcoholism in family   . BPH (benign prostatic hypertrophy)   . Diabetes mellitus without complication ([place])   . Diverticulosis   . Family history of diabetes mellitus   . History of colonic polyps   . Hyperlipidemia    Past Surgical History:  Procedure Laterality Date  . COLONOSCOPY W/ POLYPECTOMY    . none     Social History   Socioeconomic History  . Marital status: Married    Spouse name: Not on file  . Number of children: 2  . Years of education: Not on file  . Highest education level: Not on file  Occupational History  . Occupation: Retired  Scientific laboratory technician  . Financial resource strain: Not on file  . Food insecurity:    Worry: Not on file    Inability: Not on file  . Transportation needs:    Medical: Not on file    Non-medical: Not on file  Tobacco Use  . Smoking status: Never Smoker  . Smokeless tobacco: Never Used  . Tobacco comment: Regular exercise - Yes  Substance and Sexual  Activity  . Alcohol use: Yes    Alcohol/week: 1.2 oz    Types: 2 Glasses of wine per week    Comment: 2 in a week   . Drug use: No  . Sexual activity: Not Currently  Lifestyle  . Physical activity:    Days per week: Not on file    Minutes per session: Not on file  . Stress: Not on file  Relationships  . Social connections:    Talks on phone: Not on file    Gets together: Not on file    Attends religious service: Not on file    Active member of club or organization: Not on file    Attends meetings of clubs or organizations: Not on file    Relationship status: Not on file  Other Topics Concern  . Not on file  Social History Narrative  . Not on file   No Known Allergies Family History  Problem Relation Age of Onset  . Alcohol abuse Other   . Diabetes Other   . Esophageal cancer Brother        Dead  . Esophageal cancer Brother        alive  . Colon polyps Father   . Heart disease Neg Hx   . Hyperlipidemia Neg Hx   . Hypertension Neg Hx   . Kidney  disease Neg Hx   . Stroke Neg Hx   . Early death Neg Hx   . Colon cancer Neg Hx   . Gallbladder disease Neg Hx      Past medical history, social, surgical and family history all reviewed in electronic medical record.  No pertanent information unless stated regarding to the chief complaint.   Review of Systems:Review of systems updated and as accurate as of 07/06/17  No headache, visual changes, nausea, vomiting, diarrhea, constipation, dizziness, abdominal pain, skin rash, fevers, chills, night sweats, weight loss, swollen lymph nodes, body aches, joint swelling, muscle aches, chest pain, shortness of breath, mood changes.   Objective  There were no vitals taken for this visit. Systems examined below as of 07/06/17   General: No apparent distress alert and oriented x3 mood and affect normal, dressed appropriately.  HEENT: Pupils equal, extraocular movements intact  Respiratory: Patient's speak in full sentences and does  not appear short of breath  Cardiovascular: No lower extremity edema, non tender, no erythema  Skin: Warm dry intact with no signs of infection or rash on extremities or on axial skeleton.  Abdomen: Soft nontender  Neuro: Cranial nerves II through XII are intact, neurovascularly intact in all extremities with 2+ DTRs and 2+ pulses.  Lymph: No lymphadenopathy of posterior or anterior cervical chain or axillae bilaterally.  Gait normal with good balance and coordination.  MSK:  Non tender with full range of motion and good stability and symmetric strength and tone of  elbows, wrist, hip, knee and ankles bilaterally.  Left shoulder exam shows the patient does have some mild limited range of motion lacking the last 10 degrees of external and internal rotation.  Positive O'Brien's as well as positive crossover still noted.  4 out of 5 strength compared to contralateral side.  Grip strength intact.    Impression and Recommendations:     This case required medical decision making of moderate complexity.      Note: This dictation was prepared with Dragon dictation along with smaller phrase technology. Any transcriptional errors that result from this process are unintentional.

## 2017-07-07 ENCOUNTER — Ambulatory Visit: Payer: Medicare Other | Admitting: Family Medicine

## 2017-07-07 ENCOUNTER — Encounter: Payer: Self-pay | Admitting: Family Medicine

## 2017-07-07 VITALS — BP 122/80 | HR 82 | Ht 69.0 in | Wt 189.0 lb

## 2017-07-07 DIAGNOSIS — M25512 Pain in left shoulder: Secondary | ICD-10-CM | POA: Diagnosis not present

## 2017-07-07 DIAGNOSIS — G8929 Other chronic pain: Secondary | ICD-10-CM | POA: Diagnosis not present

## 2017-07-07 DIAGNOSIS — M75102 Unspecified rotator cuff tear or rupture of left shoulder, not specified as traumatic: Secondary | ICD-10-CM | POA: Diagnosis not present

## 2017-07-07 NOTE — Assessment & Plan Note (Signed)
Patient does have significant amount of intrasubstance tearing but no significant retraction of the rotator cuff.  The underlying arthritis is going to make a challenging to heal appropriately.  Discussed with patient we can do injections and with time this before his trips.  I would like him to discuss though his surgical options with his surgeon and see what the timing of that would look like as well.  I do want patient to follow-up though again in 6 weeks which would be right before his first trip for the potential for an injection.

## 2017-07-07 NOTE — Patient Instructions (Signed)
Good to see you  Sorry for the not best news  I think you are in a good position to wait on surgery until after your trips but we will need to watch it.  Ice 20 minutes 2 times daily. Usually after activity and before bed. Exercises 3 times a week.  I think see me at the end of June and lets maybe inject it to make sure you are ready for your trip Dr. Tamera Punt office will call you and they will get you set up to discuss surgical options

## 2017-07-22 ENCOUNTER — Encounter: Payer: Self-pay | Admitting: Family Medicine

## 2017-07-28 DIAGNOSIS — M67912 Unspecified disorder of synovium and tendon, left shoulder: Secondary | ICD-10-CM | POA: Diagnosis not present

## 2017-08-11 ENCOUNTER — Other Ambulatory Visit: Payer: Self-pay | Admitting: Internal Medicine

## 2017-08-11 DIAGNOSIS — M19012 Primary osteoarthritis, left shoulder: Secondary | ICD-10-CM

## 2017-08-19 ENCOUNTER — Ambulatory Visit (INDEPENDENT_AMBULATORY_CARE_PROVIDER_SITE_OTHER): Payer: Medicare Other | Admitting: Internal Medicine

## 2017-08-19 ENCOUNTER — Encounter: Payer: Self-pay | Admitting: Internal Medicine

## 2017-08-19 VITALS — BP 124/64 | HR 81 | Temp 97.8°F | Resp 16 | Ht 69.0 in | Wt 185.8 lb

## 2017-08-19 DIAGNOSIS — E118 Type 2 diabetes mellitus with unspecified complications: Secondary | ICD-10-CM

## 2017-08-19 DIAGNOSIS — M19012 Primary osteoarthritis, left shoulder: Secondary | ICD-10-CM

## 2017-08-19 DIAGNOSIS — E785 Hyperlipidemia, unspecified: Secondary | ICD-10-CM

## 2017-08-19 LAB — POCT GLYCOSYLATED HEMOGLOBIN (HGB A1C): Hemoglobin A1C: 6.1 % — AB (ref 4.0–5.6)

## 2017-08-19 LAB — HM DIABETES FOOT EXAM

## 2017-08-19 MED ORDER — ATORVASTATIN CALCIUM 40 MG PO TABS: 40.0000 mg | ORAL_TABLET | Freq: Every day | ORAL | 1 refills | Status: DC

## 2017-08-19 MED ORDER — TRAMADOL HCL 50 MG PO TABS: ORAL_TABLET | ORAL | 3 refills | Status: DC

## 2017-08-19 NOTE — Patient Instructions (Signed)

## 2017-08-19 NOTE — Progress Notes (Signed)
Subjective:  Patient ID: Taylor Torres, male    DOB: [birth year]  Age: 72 y.o. MRN: [id number]  CC: Diabetes   HPI AMANDA POTE presents for f/up - He has been working diligently on his lifestyle modifications and has been able to maintain excellent blood sugar control.    Outpatient Medications Prior to Visit  Medication Sig Dispense Refill  . B Complex-Biotin-FA (B-COMPLEX PO) Take 1 tablet by mouth daily.      . Cholecalciferol (VITAMIN D3) 1000 units CAPS Take 2,000 Units by mouth.    Marland Kitchen CINNAMON PO Take 1 capsule by mouth daily.    Marland Kitchen MAGNESIUM-ZINC PO Take 1 tablet by mouth daily.    Marland Kitchen atorvastatin (LIPITOR) 40 MG tablet Take 1 tablet (40 mg total) by mouth daily. 90 tablet 3  . glucosamine-chondroitin 500-400 MG tablet Take 1 tablet by mouth 3 (three) times daily.    . metFORMIN (GLUCOPHAGE-XR) 750 MG 24 hr tablet Take 2 tablets (1,500 mg total) by mouth daily with breakfast. 180 tablet 1  . Multiple Vitamin (MULTIVITAMIN WITH MINERALS) TABS tablet Take 1 tablet by mouth daily.    . traMADol (ULTRAM) 50 MG tablet TAKE 1 TABLET(50 MG) BY MOUTH EVERY 12 HOURS AS NEEDED 60 tablet 0   No facility-administered medications prior to visit.     ROS Review of Systems  Constitutional: Negative.  Negative for diaphoresis and fatigue.  HENT: Negative.   Eyes: Negative for visual disturbance.  Respiratory: Negative for cough, chest tightness, shortness of breath and wheezing.   Cardiovascular: Negative for chest pain, palpitations and leg swelling.  Gastrointestinal: Negative for abdominal pain, constipation, diarrhea, nausea and vomiting.  Endocrine: Negative for polydipsia, polyphagia and polyuria.  Genitourinary: Negative.  Negative for difficulty urinating.  Musculoskeletal: Positive for arthralgias. Negative for back pain, myalgias and neck pain.  Skin: Negative.   Neurological: Negative.  Negative for dizziness, weakness and light-headedness.  Hematological: Negative for  adenopathy. Does not bruise/bleed easily.  Psychiatric/Behavioral: Negative.     Objective:  BP 124/64 (BP Location: Left Arm, Patient Position: Sitting, Cuff Size: Normal)   Pulse 81   Temp 97.8 F (36.6 C) (Oral)   Resp 16   Ht 5\' 9"  (1.753 m)   Wt 185 lb 12 oz (84.3 kg)   SpO2 94%   BMI 27.43 kg/m   BP Readings from Last 3 Encounters:  08/19/17 124/64  07/07/17 122/80  04/27/17 120/70    Wt Readings from Last 3 Encounters:  08/19/17 185 lb 12 oz (84.3 kg)  07/07/17 189 lb (85.7 kg)  04/27/17 187 lb (84.8 kg)    Physical Exam  Constitutional: He is oriented to person, place, and time. No distress.  HENT:  Mouth/Throat: Oropharynx is clear and moist. No oropharyngeal exudate.  Eyes: Conjunctivae are normal. No scleral icterus.  Neck: Normal range of motion. Neck supple. No JVD present. No thyromegaly present.  Cardiovascular: Normal rate, regular rhythm and normal heart sounds.  No murmur heard. Pulmonary/Chest: Effort normal and breath sounds normal. No respiratory distress. He has no wheezes. He has no rales.  Abdominal: Soft. Normal appearance and bowel sounds are normal. He exhibits no mass. There is no hepatosplenomegaly. There is no guarding.  Musculoskeletal: Normal range of motion. He exhibits no edema, tenderness or deformity.  Lymphadenopathy:    He has no cervical adenopathy.  Neurological: He is alert and oriented to person, place, and time.  Skin: Skin is warm and dry. No rash noted. He is not  diaphoretic.  Vitals reviewed.   Lab Results  Component Value Date   WBC 6.4 02/18/2017   HGB 15.0 02/18/2017   HCT 44.5 02/18/2017   PLT 204.0 02/18/2017   GLUCOSE 165 (H) 02/18/2017   CHOL 125 02/18/2017   TRIG 134.0 02/18/2017   HDL 35.90 (L) 02/18/2017   LDLDIRECT 87.0 02/03/2016   LDLCALC 63 02/18/2017   ALT 17 02/18/2017   AST 16 02/18/2017   NA 139 02/18/2017   K 4.1 02/18/2017   CL 104 02/18/2017   CREATININE 1.00 02/18/2017   BUN 20  02/18/2017   CO2 27 02/18/2017   TSH 1.58 02/18/2017   PSA 0.53 02/18/2017   HGBA1C 6.1 (A) 08/19/2017   MICROALBUR 2.2 (H) 02/18/2017    Mr Shoulder Left W Contrast  Result Date: 05/19/2017 CLINICAL DATA:  Left shoulder pain and limited range of motion for 8 months. Painful range of motion. EXAM: MR ARTHROGRAM OF THE LEFT SHOULDER TECHNIQUE: Multiplanar, multisequence MR imaging of the left shoulder was performed following the administration of intra-articular contrast. CONTRAST:  See Injection Documentation. COMPARISON:  None. FINDINGS: Rotator cuff: Moderate tendinosis of the supraspinatus tendon with a small partial-thickness articular-surface tear. Mild tendinosis of the infraspinatus tendon. Teres minor tendon is intact. Moderate tendinosis of the subscapularis tendon with an insertional interstitial tear. Muscles: No atrophy or fatty replacement of nor abnormal signal within, the muscles of the rotator cuff. Biceps long head: Severe tendinosis of the intraarticular portion of the long head of the biceps tendon. Moderate tendinosis of the proximal extra-articular portion of the long head of the biceps tendon. Acromioclavicular Joint: Severe arthropathy of the acromioclavicular joint with hypertrophic changes along the undersurface deforming the anterior supraspinatus musculotendinous junction. Type I acromion. No significant subacromial/subdeltoid bursal fluid. Glenohumeral Joint: Intraarticular contrast distending the joint capsule. Normal glenohumeral ligaments. Partial-thickness cartilage loss of the glenohumeral joint with areas of high-grade partial-thickness cartilage loss along the superior humeral head. Labrum: Mild diffuse labral degeneration.  No discrete labral tear. Bones: No acute osseous abnormality.  No aggressive osseous lesion. IMPRESSION: 1. Moderate tendinosis of the supraspinatus tendon with a small partial-thickness articular-surface tear. 2. Mild tendinosis of the infraspinatus  tendon. 3. Moderate tendinosis of the subscapularis tendon with an insertional interstitial tear. 4. Severe tendinosis of the intraarticular portion of the long head of the biceps tendon. Moderate tendinosis of the proximal extra-articular portion of the long head of the biceps tendon. 5. 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 6. Severe arthropathy of the acromioclavicular joint with hypertrophic changes along the undersurface deforming the anterior supraspinatus musculotendinous junction. Electronically Signed   By: Kathreen Devoid   On: 05/19/2017 16:08   Dg Fluoro Guided Needle Plc Aspiration/injection Loc  Result Date: 05/18/2017 CLINICAL DATA:  Left shoulder pain. FLUOROSCOPY TIME:  Radiation Exposure Index (as provided by the fluoroscopic device): 7.26 uGy*m2 If the device does not provide the exposure index: Fluoroscopy Time:  8 seconds Number of Acquired Images:  0 PROCEDURE: Left SHOULDER INJECTION UNDER FLUOROSCOPY An appropriate skin entrance site was determined. The site was marked, prepped with Betadine, draped in the usual sterile fashion, and infiltrated locally with buffered Lidocaine. 22 gauge spinal needle was advanced to the superomedial margin of the humeral head under intermittent fluoroscopy. 1 ml of Lidocaine injected easily. A mixture of 0.1 ml Multihance and 20 ml of dilute Isovue 200 was then used to opacify the left shoulder capsule. No immediate complication. IMPRESSION: Technically successful left shoulder injection for MRI. Electronically Signed  By: San Morelle M.D.   On: 05/18/2017 16:11    Assessment & Plan:   Jonavin was seen today for diabetes.  Diagnoses and all orders for this visit:  Type 2 diabetes mellitus with complication, without long-term current use of insulin ([place])- He has been very successful with his lifestyle modifications and his A1c is down to 6.1%.  I have advised him that he can stop taking metformin.  He will continue to  monitor his blood sugars. -     POCT glycosylated hemoglobin (Hb A1C) -     atorvastatin (LIPITOR) 40 MG tablet; Take 1 tablet (40 mg total) by mouth daily.  Primary osteoarthritis of left shoulder -     traMADol (ULTRAM) 50 MG tablet; TAKE 1 TABLET(50 MG) BY MOUTH EVERY 12 HOURS AS NEEDED  Hyperlipidemia with target LDL less than 130 -     atorvastatin (LIPITOR) 40 MG tablet; Take 1 tablet (40 mg total) by mouth daily.   I have discontinued Dejion Grillo. Sills's multivitamin with minerals, metFORMIN, and glucosamine-chondroitin. I am also having him maintain his B Complex-Biotin-FA (B-COMPLEX PO), MAGNESIUM-ZINC PO, Vitamin D3, CINNAMON PO, traMADol, and atorvastatin.  Meds ordered this encounter  Medications  . traMADol (ULTRAM) 50 MG tablet    Sig: TAKE 1 TABLET(50 MG) BY MOUTH EVERY 12 HOURS AS NEEDED    Dispense:  60 tablet    Refill:  3  . atorvastatin (LIPITOR) 40 MG tablet    Sig: Take 1 tablet (40 mg total) by mouth daily.    Dispense:  90 tablet    Refill:  1     Follow-up: Return in about 4 months (around 12/19/2017).  Scarlette Calico, MD

## 2017-08-25 DIAGNOSIS — M67912 Unspecified disorder of synovium and tendon, left shoulder: Secondary | ICD-10-CM | POA: Diagnosis not present

## 2017-09-18 ENCOUNTER — Encounter: Payer: Self-pay | Admitting: Internal Medicine

## 2017-09-20 ENCOUNTER — Other Ambulatory Visit: Payer: Self-pay | Admitting: Internal Medicine

## 2017-09-20 DIAGNOSIS — M19012 Primary osteoarthritis, left shoulder: Secondary | ICD-10-CM

## 2017-09-20 MED ORDER — TRAMADOL HCL 50 MG PO TABS: ORAL_TABLET | ORAL | 3 refills | Status: DC

## 2017-10-05 ENCOUNTER — Other Ambulatory Visit: Payer: Self-pay

## 2017-10-05 NOTE — Patient Outreach (Signed)
[facility]) Care Management  10/05/2017  MALIK PAAR March 22, 1945 [id number]   Medication Adherence call to Mr. Bearl Talarico left a message for patient to call back patient is due on Metformin Er 750 mg. Mr. Lineman is showing past due under [place].   [facility] Management Direct Dial [phone]  Fax [phone] [email or web address]

## 2017-12-16 ENCOUNTER — Other Ambulatory Visit: Payer: Self-pay | Admitting: Internal Medicine

## 2017-12-16 DIAGNOSIS — E118 Type 2 diabetes mellitus with unspecified complications: Secondary | ICD-10-CM

## 2017-12-16 DIAGNOSIS — E785 Hyperlipidemia, unspecified: Secondary | ICD-10-CM

## 2018-01-28 DIAGNOSIS — H43393 Other vitreous opacities, bilateral: Secondary | ICD-10-CM | POA: Diagnosis not present

## 2018-02-03 LAB — HM DIABETES EYE EXAM

## 2018-02-17 ENCOUNTER — Encounter: Payer: Self-pay | Admitting: Internal Medicine

## 2018-02-21 ENCOUNTER — Other Ambulatory Visit (INDEPENDENT_AMBULATORY_CARE_PROVIDER_SITE_OTHER): Payer: Medicare Other

## 2018-02-21 ENCOUNTER — Encounter: Payer: Self-pay | Admitting: Internal Medicine

## 2018-02-21 ENCOUNTER — Ambulatory Visit (INDEPENDENT_AMBULATORY_CARE_PROVIDER_SITE_OTHER): Payer: Medicare Other | Admitting: Internal Medicine

## 2018-02-21 VITALS — BP 130/60 | HR 96 | Temp 97.9°F | Resp 16 | Ht 68.75 in | Wt 194.0 lb

## 2018-02-21 DIAGNOSIS — N400 Enlarged prostate without lower urinary tract symptoms: Secondary | ICD-10-CM

## 2018-02-21 DIAGNOSIS — E785 Hyperlipidemia, unspecified: Secondary | ICD-10-CM

## 2018-02-21 DIAGNOSIS — E118 Type 2 diabetes mellitus with unspecified complications: Secondary | ICD-10-CM | POA: Diagnosis not present

## 2018-02-21 DIAGNOSIS — R002 Palpitations: Secondary | ICD-10-CM | POA: Diagnosis not present

## 2018-02-21 DIAGNOSIS — Z0000 Encounter for general adult medical examination without abnormal findings: Secondary | ICD-10-CM

## 2018-02-21 DIAGNOSIS — Z23 Encounter for immunization: Secondary | ICD-10-CM

## 2018-02-21 DIAGNOSIS — Z7184 Encounter for health counseling related to travel: Secondary | ICD-10-CM

## 2018-02-21 LAB — COMPREHENSIVE METABOLIC PANEL
ALT: 20 U/L (ref 0–53)
AST: 16 U/L (ref 0–37)
Albumin: 4.6 g/dL (ref 3.5–5.2)
Alkaline Phosphatase: 58 U/L (ref 39–117)
BUN: 15 mg/dL (ref 6–23)
CHLORIDE: 102 meq/L (ref 96–112)
CO2: 30 mEq/L (ref 19–32)
Calcium: 10 mg/dL (ref 8.4–10.5)
Creatinine, Ser: 1.04 mg/dL (ref 0.40–1.50)
GFR: 74.5 mL/min (ref >60.00)
Glucose, Bld: 224 mg/dL — ABNORMAL HIGH (ref 70–99)
Potassium: 4.6 mEq/L (ref 3.5–5.1)
Sodium: 138 mEq/L (ref 135–145)
Total Bilirubin: 0.4 mg/dL (ref 0.2–1.2)
Total Protein: 7.1 g/dL (ref 6.0–8.3)

## 2018-02-21 LAB — CBC WITH DIFFERENTIAL/PLATELET
BASOS PCT: 0.5 % (ref 0.0–3.0)
Basophils Absolute: 0 10*3/uL (ref 0.0–0.1)
EOS PCT: 3.6 % (ref 0.0–5.0)
Eosinophils Absolute: 0.2 10*3/uL (ref 0.0–0.7)
HCT: 42.3 % (ref 39.0–52.0)
Hemoglobin: 14.7 g/dL (ref 13.0–17.0)
Lymphocytes Relative: 26.9 % (ref 12.0–46.0)
Lymphs Abs: 1.6 10*3/uL (ref 0.7–4.0)
MCHC: 34.8 g/dL (ref 30.0–36.0)
MCV: 92.7 fl (ref 78.0–100.0)
Monocytes Absolute: 0.4 10*3/uL (ref 0.1–1.0)
Monocytes Relative: 7.1 % (ref 3.0–12.0)
Neutro Abs: 3.7 10*3/uL (ref 1.4–7.7)
Neutrophils Relative %: 61.9 % (ref 43.0–77.0)
PLATELETS: 179 10*3/uL (ref 150.0–400.0)
RBC: 4.57 Mil/uL (ref 4.22–5.81)
RDW: 12.1 % (ref 11.5–15.5)
WBC: 6 10*3/uL (ref 4.0–10.5)

## 2018-02-21 LAB — URINALYSIS, ROUTINE W REFLEX MICROSCOPIC
Bilirubin Urine: NEGATIVE
Hgb urine dipstick: NEGATIVE
Ketones, ur: NEGATIVE
Leukocytes, UA: NEGATIVE
Nitrite: NEGATIVE
RBC / HPF: NONE SEEN (ref 0–?)
Specific Gravity, Urine: 1.025 (ref 1.000–1.030)
Total Protein, Urine: NEGATIVE
Urine Glucose: 500 — AB
Urobilinogen, UA: 0.2 (ref 0.0–1.0)
WBC, UA: NONE SEEN (ref 0–?)
pH: 5.5 (ref 5.0–8.0)

## 2018-02-21 LAB — LIPID PANEL
Cholesterol: 110 mg/dL (ref 0–200)
HDL: 34.6 mg/dL — ABNORMAL LOW (ref >39.00)
LDL Cholesterol: 40 mg/dL (ref 0–99)
NonHDL: 75.53
Total CHOL/HDL Ratio: 3
Triglycerides: 177 mg/dL — ABNORMAL HIGH (ref 0.0–149.0)
VLDL: 35.4 mg/dL (ref 0.0–40.0)

## 2018-02-21 LAB — MICROALBUMIN / CREATININE URINE RATIO
Creatinine,U: 123.3 mg/dL
Microalb Creat Ratio: 0.9 mg/g (ref 0.0–30.0)
Microalb, Ur: 1.1 mg/dL (ref 0.0–1.9)

## 2018-02-21 LAB — HEMOGLOBIN A1C: Hgb A1c MFr Bld: 7.2 % — ABNORMAL HIGH (ref 4.6–6.5)

## 2018-02-21 LAB — TSH: TSH: 1.24 u[IU]/mL (ref 0.35–4.50)

## 2018-02-21 LAB — PSA: PSA: 0.49 ng/mL (ref 0.10–4.00)

## 2018-02-21 MED ORDER — ATORVASTATIN CALCIUM 40 MG PO TABS: 40.0000 mg | ORAL_TABLET | Freq: Every day | ORAL | 1 refills | Status: DC

## 2018-02-21 MED ORDER — METFORMIN HCL ER 750 MG PO TB24: 1500.0000 mg | ORAL_TABLET | Freq: Every day | ORAL | 1 refills | Status: DC

## 2018-02-21 NOTE — Progress Notes (Signed)
Subjective:  Patient ID: Taylor Torres, male    DOB: 1946-02-08  Age: 72 y.o. MRN: [id number]  CC: Hyperlipidemia; Annual Exam; and Palpitations   HPI Taylor Torres presents for a CPX.  He is traveling to [place] in 6 months and wants to get boosters against hepatitis A and B.  He complains of palpitations.  He describes sensations of an elevated heart rate when he is laying down.  This has happened sporadically over the last 6 months.  It lasts 20 seconds and then spontaneously resolves.  He never notices it with exertion.  He denies CP, SOB, lightheadedness, dizziness, fatigue, or near syncope.  Past Medical History:  Diagnosis Date  . Alcoholism in family   . BPH (benign prostatic hypertrophy)   . Diabetes mellitus without complication ([place])   . Diverticulosis   . Family history of diabetes mellitus   . History of colonic polyps   . Hyperlipidemia    Past Surgical History:  Procedure Laterality Date  . COLONOSCOPY W/ POLYPECTOMY    . none      reports that he has never smoked. He has never used smokeless tobacco. He reports current alcohol use of about 2.0 standard drinks of alcohol per week. He reports that he does not use drugs. family history includes Alcohol abuse in an other family member; Colon polyps in his father; Diabetes in an other family member; Esophageal cancer in his brother and brother. No Known Allergies  Outpatient Medications Prior to Visit  Medication Sig Dispense Refill  . B Complex-Biotin-FA (B-COMPLEX PO) Take 1 tablet by mouth daily.      . Cholecalciferol (VITAMIN D3) 1000 units CAPS Take 2,000 Units by mouth.    Marland Kitchen CINNAMON PO Take 1 capsule by mouth daily.    . Coenzyme Q10 (CO Q-10) 120 MG CAPS Take 1 capsule by mouth daily.     . Flax Oil-Fish Oil-Borage Oil (FISH OIL-FLAX OIL-BORAGE OIL PO) Take 1 each by mouth daily.    Marland Kitchen MAGNESIUM-ZINC PO Take 1 tablet by mouth daily.    . Misc Natural Products (BLACK CHERRY CONCENTRATE PO) Take 1 each by  mouth daily.     . Misc Natural Products (GLUCOSAMINE CHOND COMPLEX/MSM PO) Take 1 each by mouth daily.     . Multiple Vitamin (MULTI-VITAMIN DAILY PO) Take 1 each by mouth daily.     . Nutritional Supplements (PYCNOGENOL PO) Take 1 each by mouth daily.     Marland Kitchen atorvastatin (LIPITOR) 40 MG tablet TAKE 1 TABLET BY MOUTH  DAILY 90 tablet 0  . traMADol (ULTRAM) 50 MG tablet TAKE 1 TABLET(50 MG) BY MOUTH EVERY 12 HOURS AS NEEDED 60 tablet 3   No facility-administered medications prior to visit.     ROS Review of Systems  Constitutional: Positive for unexpected weight change (wt gain). Negative for chills, diaphoresis and fatigue.  HENT: Negative.   Eyes: Negative for visual disturbance.  Respiratory: Negative for cough, chest tightness, shortness of breath and wheezing.   Cardiovascular: Positive for palpitations. Negative for chest pain and leg swelling.  Gastrointestinal: Negative for abdominal pain, diarrhea, nausea and vomiting.  Endocrine: Negative for polydipsia, polyphagia and polyuria.  Genitourinary: Negative.  Negative for difficulty urinating, scrotal swelling and testicular pain.  Musculoskeletal: Negative.  Negative for myalgias.  Skin: Negative.  Negative for color change.  Neurological: Negative.  Negative for dizziness, weakness, light-headedness and headaches.  Hematological: Negative for adenopathy. Does not bruise/bleed easily.  Psychiatric/Behavioral: Negative.  Objective:  BP 130/60 (BP Location: Left Arm, Patient Position: Sitting, Cuff Size: Large)   Pulse 96   Temp 97.9 F (36.6 C) (Oral)   Resp 16   Ht 5' 8.75" (1.746 m)   Wt 194 lb (88 kg)   SpO2 98%   BMI 28.86 kg/m   BP Readings from Last 3 Encounters:  02/21/18 130/60  08/19/17 124/64  07/07/17 122/80    Wt Readings from Last 3 Encounters:  02/21/18 194 lb (88 kg)  08/19/17 185 lb 12 oz (84.3 kg)  07/07/17 189 lb (85.7 kg)    Physical Exam Vitals signs reviewed.  Constitutional:       Appearance: Normal appearance.  HENT:     Nose: Nose normal.     Mouth/Throat:     Pharynx: No oropharyngeal exudate.  Eyes:     Conjunctiva/sclera: Conjunctivae normal.  Neck:     Musculoskeletal: Normal range of motion and neck supple. No muscular tenderness.  Cardiovascular:     Rate and Rhythm: Normal rate and regular rhythm.     Heart sounds: No murmur. No gallop.      Comments: EKG ---  Sinus  Rhythm  WITHIN NORMAL LIMITS  Pulmonary:     Effort: Pulmonary effort is normal.     Breath sounds: Normal breath sounds. No stridor. No wheezing, rhonchi or rales.  Abdominal:     General: Abdomen is flat.     Palpations: There is no hepatomegaly or splenomegaly.     Tenderness: There is no abdominal tenderness.     Hernia: No hernia is present. There is no hernia in the right inguinal area or left inguinal area.  Genitourinary:    Pubic Area: No rash.      Penis: Normal and circumcised. No erythema, swelling or lesions.      Scrotum/Testes: Normal.        Right: Mass, tenderness or swelling not present.        Left: Mass, tenderness or swelling not present.     Epididymis:     Right: Normal.     Left: Normal.     Prostate: Normal. Not enlarged, not tender and no nodules present.     Rectum: Normal. Guaiac result negative. No mass, tenderness, anal fissure, external hemorrhoid or internal hemorrhoid. Normal anal tone.  Musculoskeletal: Normal range of motion.  Lymphadenopathy:     Cervical: No cervical adenopathy.     Lower Body: No right inguinal adenopathy. No left inguinal adenopathy.  Skin:    General: Skin is warm and dry.     Findings: No erythema.  Neurological:     General: No focal deficit present.     Mental Status: He is alert and oriented to person, place, and time. Mental status is at baseline.  Psychiatric:        Mood and Affect: Mood normal.        Behavior: Behavior normal.        Thought Content: Thought content normal.        Judgment: Judgment normal.      Lab Results  Component Value Date   WBC 6.0 02/21/2018   HGB 14.7 02/21/2018   HCT 42.3 02/21/2018   PLT 179.0 02/21/2018   GLUCOSE 224 (H) 02/21/2018   CHOL 110 02/21/2018   TRIG 177.0 (H) 02/21/2018   HDL 34.60 (L) 02/21/2018   LDLDIRECT 87.0 02/03/2016   LDLCALC 40 02/21/2018   ALT 20 02/21/2018   AST 16 02/21/2018   NA  138 02/21/2018   K 4.6 02/21/2018   CL 102 02/21/2018   CREATININE 1.04 02/21/2018   BUN 15 02/21/2018   CO2 30 02/21/2018   TSH 1.24 02/21/2018   PSA 0.49 02/21/2018   HGBA1C 7.2 (H) 02/21/2018   MICROALBUR 1.1 02/21/2018    Mr Shoulder Left W Contrast  Result Date: 05/19/2017 CLINICAL DATA:  Left shoulder pain and limited range of motion for 8 months. Painful range of motion. EXAM: MR ARTHROGRAM OF THE LEFT SHOULDER TECHNIQUE: Multiplanar, multisequence MR imaging of the left shoulder was performed following the administration of intra-articular contrast. CONTRAST:  See Injection Documentation. COMPARISON:  None. FINDINGS: Rotator cuff: Moderate tendinosis of the supraspinatus tendon with a small partial-thickness articular-surface tear. Mild tendinosis of the infraspinatus tendon. Teres minor tendon is intact. Moderate tendinosis of the subscapularis tendon with an insertional interstitial tear. Muscles: No atrophy or fatty replacement of nor abnormal signal within, the muscles of the rotator cuff. Biceps long head: Severe tendinosis of the intraarticular portion of the long head of the biceps tendon. Moderate tendinosis of the proximal extra-articular portion of the long head of the biceps tendon. Acromioclavicular Joint: Severe arthropathy of the acromioclavicular joint with hypertrophic changes along the undersurface deforming the anterior supraspinatus musculotendinous junction. Type I acromion. No significant subacromial/subdeltoid bursal fluid. Glenohumeral Joint: Intraarticular contrast distending the joint capsule. Normal glenohumeral ligaments.  Partial-thickness cartilage loss of the glenohumeral joint with areas of high-grade partial-thickness cartilage loss along the superior humeral head. Labrum: Mild diffuse labral degeneration.  No discrete labral tear. Bones: No acute osseous abnormality.  No aggressive osseous lesion. IMPRESSION: 1. Moderate tendinosis of the supraspinatus tendon with a small partial-thickness articular-surface tear. 2. Mild tendinosis of the infraspinatus tendon. 3. Moderate tendinosis of the subscapularis tendon with an insertional interstitial tear. 4. Severe tendinosis of the intraarticular portion of the long head of the biceps tendon. Moderate tendinosis of the proximal extra-articular portion of the long head of the biceps tendon. 5. 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 6. Severe arthropathy of the acromioclavicular joint with hypertrophic changes along the undersurface deforming the anterior supraspinatus musculotendinous junction. Electronically Signed   By: Kathreen Devoid   On: 05/19/2017 16:08   Dg Fluoro Guided Needle Plc Aspiration/injection Loc  Result Date: 05/18/2017 CLINICAL DATA:  Left shoulder pain. FLUOROSCOPY TIME:  Radiation Exposure Index (as provided by the fluoroscopic device): 7.26 uGy*m2 If the device does not provide the exposure index: Fluoroscopy Time:  8 seconds Number of Acquired Images:  0 PROCEDURE: Left SHOULDER INJECTION UNDER FLUOROSCOPY An appropriate skin entrance site was determined. The site was marked, prepped with Betadine, draped in the usual sterile fashion, and infiltrated locally with buffered Lidocaine. 22 gauge spinal needle was advanced to the superomedial margin of the humeral head under intermittent fluoroscopy. 1 ml of Lidocaine injected easily. A mixture of 0.1 ml Multihance and 20 ml of dilute Isovue 200 was then used to opacify the left shoulder capsule. No immediate complication. IMPRESSION: Technically successful left shoulder injection for MRI.  Electronically Signed   By: San Morelle M.D.   On: 05/18/2017 16:11    Assessment & Plan:   Harlan was seen today for hyperlipidemia, annual exam and palpitations.  Diagnoses and all orders for this visit:  Type 2 diabetes mellitus with complication, without long-term current use of insulin ([place])- His A1c is up to 7.2% and he has glucose in his urine.  In addition to lifestyle modifications I have asked him to start taking  metformin to control his blood sugars. -     CBC with Differential/Platelet; Future -     Comprehensive metabolic panel; Future -     Hemoglobin A1c; Future -     Microalbumin / creatinine urine ratio; Future -     atorvastatin (LIPITOR) 40 MG tablet; Take 1 tablet (40 mg total) by mouth daily. -     metFORMIN (GLUCOPHAGE-XR) 750 MG 24 hr tablet; Take 2 tablets (1,500 mg total) by mouth daily with breakfast.  Hyperlipidemia with target LDL less than 130- He has achieved his LDL goal and is doing well on the statin. -     CBC with Differential/Platelet; Future -     Lipid panel; Future -     Comprehensive metabolic panel; Future -     TSH; Future -     atorvastatin (LIPITOR) 40 MG tablet; Take 1 tablet (40 mg total) by mouth daily.  BPH without obstruction/lower urinary tract symptoms- His PSA is low which is reassuring that he does not have prostate cancer.  He has no symptoms that need to be treated. -     PSA; Future -     Urinalysis, Routine w reflex microscopic; Future  Travel advice encounter  Rapid palpitations- His EKG today is normal.  I have asked him to undergo an event monitor to identify his rhythm during the episodes of palpitations. -     CARDIAC EVENT MONITOR; Future -     EKG 12-Lead  Need for hepatitis A and B vaccination -     Hepatitis A hepatitis B combined vaccine IM   I have discontinued [name]. Magloire's traMADol. I have also changed his atorvastatin. Additionally, I am having him start on metFORMIN. Lastly, I am having him  maintain his B Complex-Biotin-FA (B-COMPLEX PO), MAGNESIUM-ZINC PO, Vitamin D3, CINNAMON PO, Nutritional Supplements (PYCNOGENOL PO), Co Q-10, Misc Natural Products (BLACK CHERRY CONCENTRATE PO), Misc Natural Products (GLUCOSAMINE CHOND COMPLEX/MSM PO), Multiple Vitamin (MULTI-VITAMIN DAILY PO), and Flax Oil-Fish Oil-Borage Oil (FISH OIL-FLAX OIL-BORAGE OIL PO).  Meds ordered this encounter  Medications  . atorvastatin (LIPITOR) 40 MG tablet    Sig: Take 1 tablet (40 mg total) by mouth daily.    Dispense:  90 tablet    Refill:  1  . metFORMIN (GLUCOPHAGE-XR) 750 MG 24 hr tablet    Sig: Take 2 tablets (1,500 mg total) by mouth daily with breakfast.    Dispense:  180 tablet    Refill:  1   See AVS for instructions about healthy living and anticipatory guidance.  Follow-up: Return in about 4 weeks (around 03/21/2018).  Scarlette Calico, MD

## 2018-02-21 NOTE — Assessment & Plan Note (Signed)

## 2018-02-21 NOTE — Patient Instructions (Signed)

## 2018-03-18 ENCOUNTER — Ambulatory Visit (INDEPENDENT_AMBULATORY_CARE_PROVIDER_SITE_OTHER): Payer: Medicare Other

## 2018-03-18 DIAGNOSIS — R002 Palpitations: Secondary | ICD-10-CM

## 2018-03-21 ENCOUNTER — Ambulatory Visit (INDEPENDENT_AMBULATORY_CARE_PROVIDER_SITE_OTHER): Payer: Medicare Other

## 2018-03-21 DIAGNOSIS — Z299 Encounter for prophylactic measures, unspecified: Secondary | ICD-10-CM

## 2018-03-21 DIAGNOSIS — Z23 Encounter for immunization: Secondary | ICD-10-CM | POA: Diagnosis not present

## 2018-04-24 ENCOUNTER — Encounter: Payer: Self-pay | Admitting: Internal Medicine

## 2018-05-02 DIAGNOSIS — D225 Melanocytic nevi of trunk: Secondary | ICD-10-CM | POA: Diagnosis not present

## 2018-05-02 DIAGNOSIS — Z85828 Personal history of other malignant neoplasm of skin: Secondary | ICD-10-CM | POA: Diagnosis not present

## 2018-05-02 DIAGNOSIS — L570 Actinic keratosis: Secondary | ICD-10-CM | POA: Diagnosis not present

## 2018-05-02 DIAGNOSIS — D1801 Hemangioma of skin and subcutaneous tissue: Secondary | ICD-10-CM | POA: Diagnosis not present

## 2018-05-02 DIAGNOSIS — L814 Other melanin hyperpigmentation: Secondary | ICD-10-CM | POA: Diagnosis not present

## 2018-07-14 ENCOUNTER — Other Ambulatory Visit: Payer: Self-pay | Admitting: Internal Medicine

## 2018-07-14 DIAGNOSIS — E118 Type 2 diabetes mellitus with unspecified complications: Secondary | ICD-10-CM

## 2018-08-02 ENCOUNTER — Encounter: Payer: Self-pay | Admitting: Internal Medicine

## 2018-08-04 IMAGING — MR MR SHOULDER*L* W/ CM
5 series · 39 of 40 positions shown · IV contrast (agent unspecified)
Comparison: None.

CLINICAL DATA: Left shoulder pain and limited range of motion for 8
months. Painful range of motion.

EXAM:
MR ARTHROGRAM OF THE LEFT SHOULDER
TECHNIQUE: Multiplanar, multisequence MR imaging of the left shoulder was
performed following the administration of intra-articular contrast.
CONTRAST:  See Injection Documentation.

[Series 5: T1 fat-sat · axial · 4.0mm · 0.23mm/px · z∈[-48,+44]mm · 9 of 21 slices shown (1 of 3)]
[im 1/21]
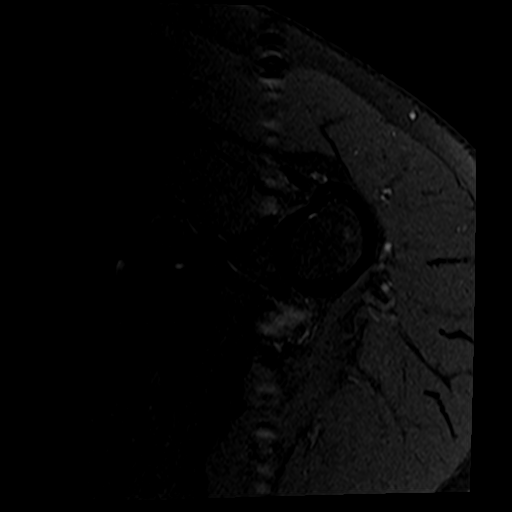
[im 3/21]
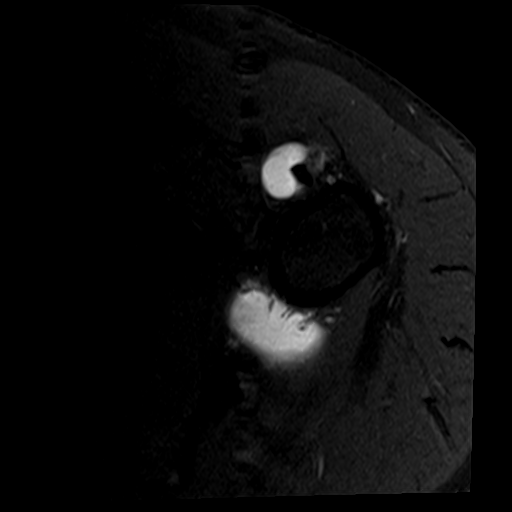
[im 5/21]
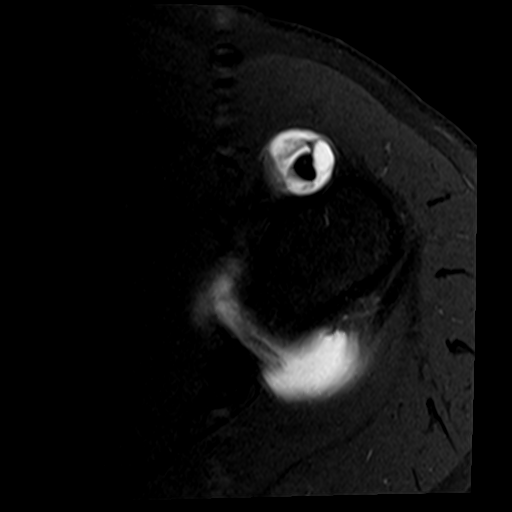
[im 7/21]
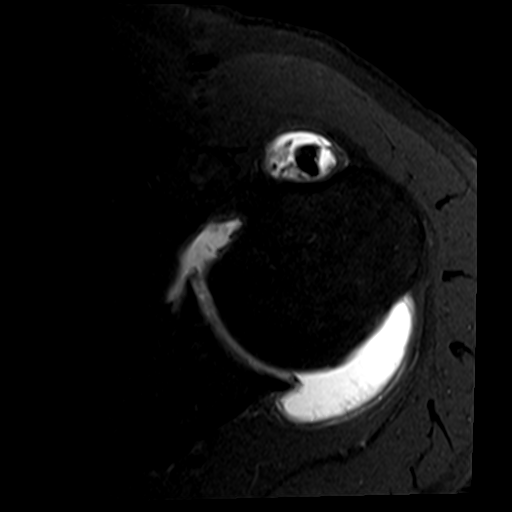
[im 9/21]
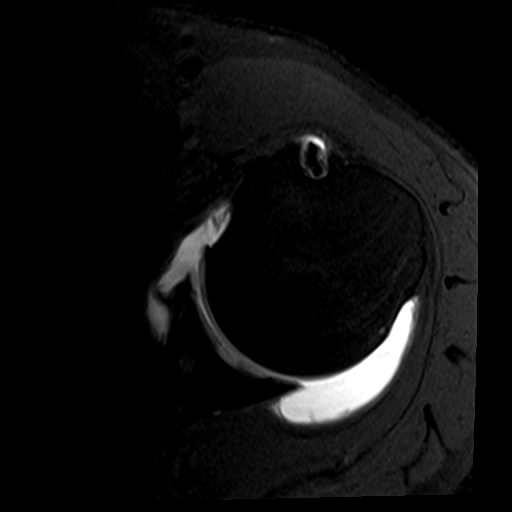
[im 12/21]
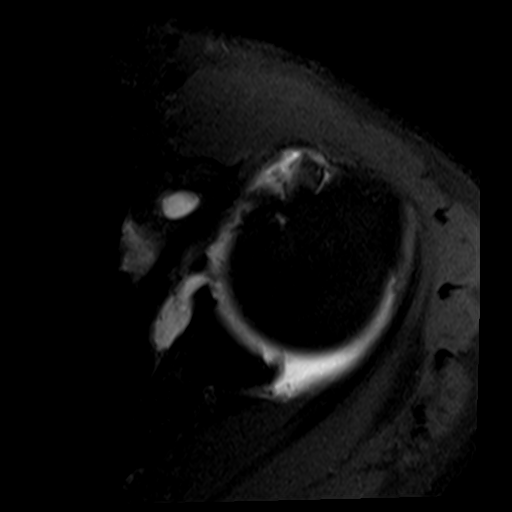
[im 14/21]
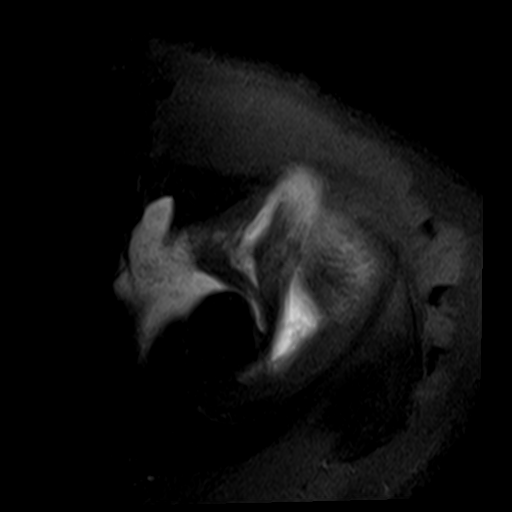
[im 18/21]
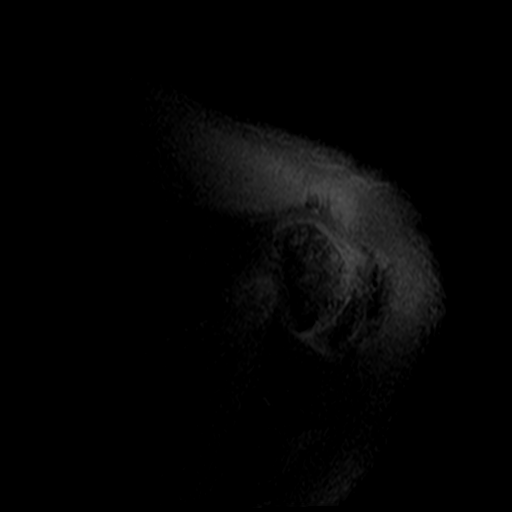
[im 21/21]
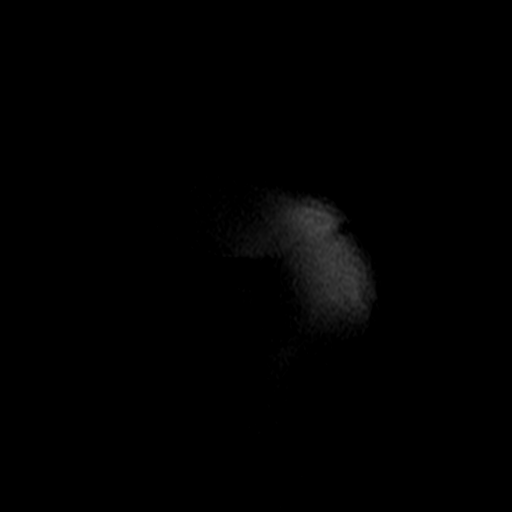

[Series 7: T2 fat-sat · oblique · 4.0mm · 0.55mm/px · 7 of 17 slices shown (1 of 2)]
[im 1/17]
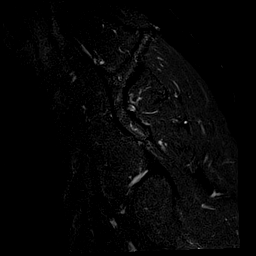
[im 3/17]
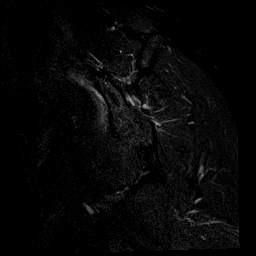
[im 6/17]
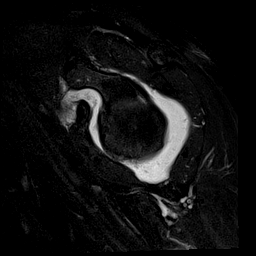
[im 9/17]
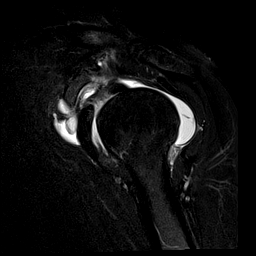
[im 11/17]
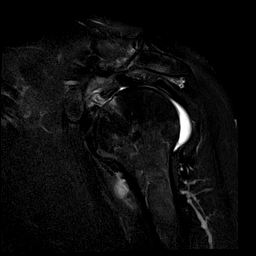
[im 14/17]
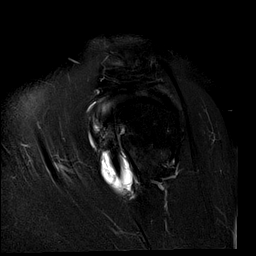
[im 17/17]
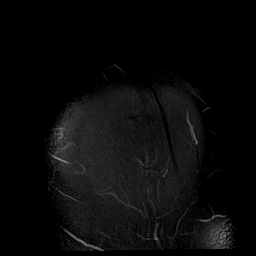

[Series 8: T1 fat-sat · oblique · 4.0mm · 0.55mm/px · 8 of 19 slices shown (2 of 3)]
[im 1/19]
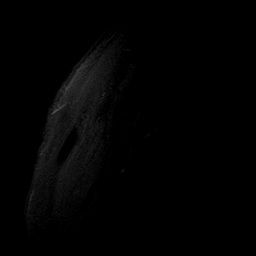
[im 3/19]
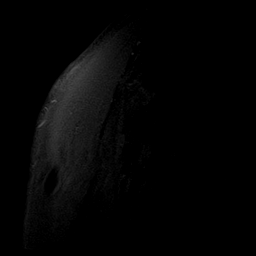
[im 6/19]
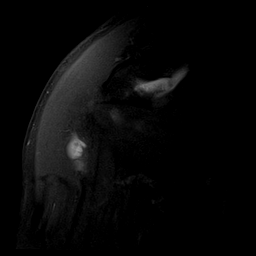
[im 8/19]
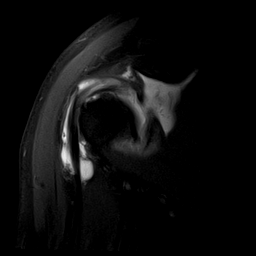
[im 11/19]
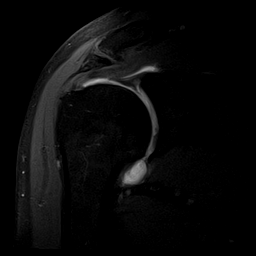
[im 13/19]
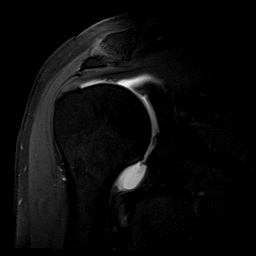
[im 16/19]
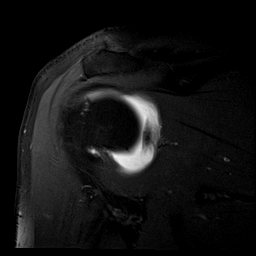
[im 19/19]
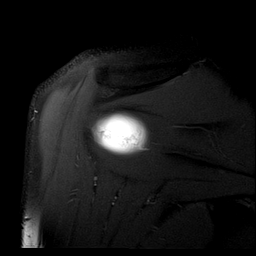

[Series 9: T2 fat-sat · oblique · 4.0mm · 0.55mm/px · 8 of 19 slices shown (2 of 2)]
[im 1/19]
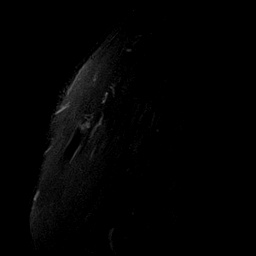
[im 3/19]
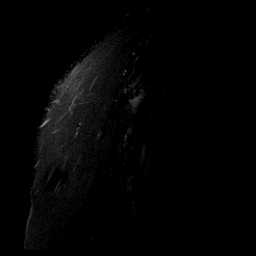
[im 6/19]
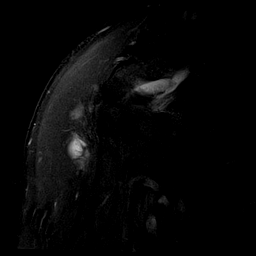
[im 8/19]
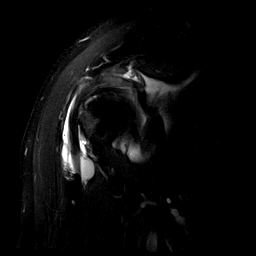
[im 11/19]
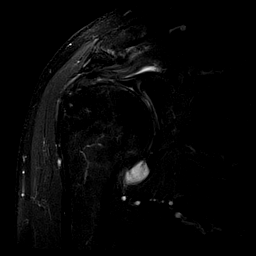
[im 13/19]
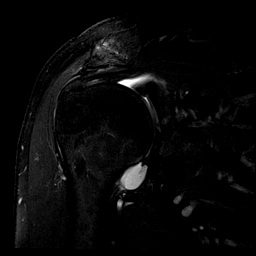
[im 16/19]
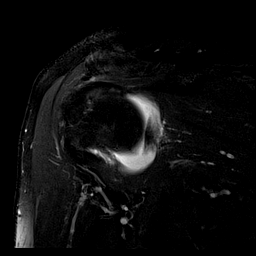
[im 19/19]
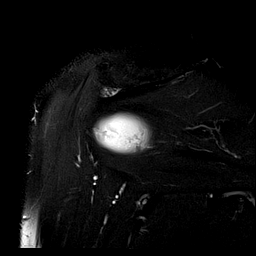

[Series 13: T1 fat-sat · sagittal · 4.0mm · 0.59mm/px · 7 of 16 slices shown (3 of 3)]
[im 1/16]
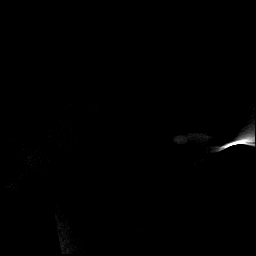
[im 3/16]
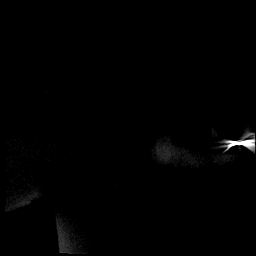
[im 6/16]
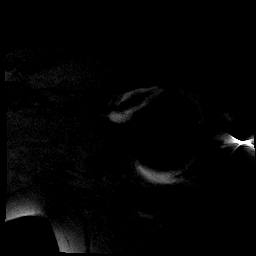
[im 8/16]
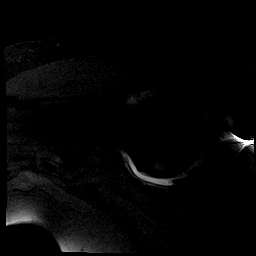
[im 11/16]
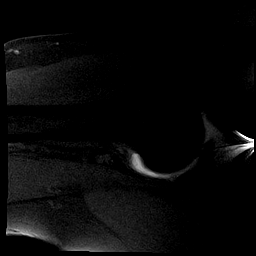
[im 13/16]
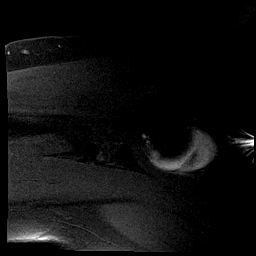
[im 16/16]
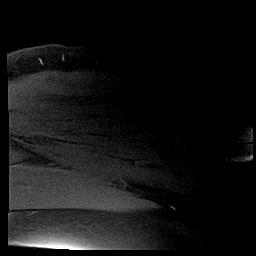

[39 of 40 positions shown; findings below may reference images not displayed]

FINDINGS: Rotator cuff: Moderate tendinosis of the supraspinatus tendon with a
small partial-thickness articular-surface tear. Mild tendinosis of
the infraspinatus tendon. Teres minor tendon is intact. Moderate
tendinosis of the subscapularis tendon with an insertional
interstitial tear.

Muscles: No atrophy or fatty replacement of nor abnormal signal
within, the muscles of the rotator cuff.

Biceps long head: Severe tendinosis of the intraarticular portion of
the long head of the biceps tendon. Moderate tendinosis of the
proximal extra-articular portion of the long head of the biceps
tendon.

Acromioclavicular Joint: Severe arthropathy of the acromioclavicular
joint with hypertrophic changes along the undersurface deforming the
anterior supraspinatus musculotendinous junction. Type I acromion.
No significant subacromial/subdeltoid bursal fluid.

Glenohumeral Joint: Intraarticular contrast distending the joint
capsule. Normal glenohumeral ligaments. Partial-thickness cartilage
loss of the glenohumeral joint with areas of high-grade
partial-thickness cartilage loss along the superior humeral head.

Labrum: Mild diffuse labral degeneration.  No discrete labral tear.

Bones: No acute osseous abnormality.  No aggressive osseous lesion.
IMPRESSION: 1. Moderate tendinosis of the supraspinatus tendon with a small
partial-thickness articular-surface tear.
2. Mild tendinosis of the infraspinatus tendon.
3. Moderate tendinosis of the subscapularis tendon with an
insertional interstitial tear.
4. Severe tendinosis of the intraarticular portion of the long head
of the biceps tendon. Moderate tendinosis of the proximal
extra-articular portion of the long head of the biceps tendon.
5. Mild-moderate osteoarthritis of the left glenohumeral joint.
6. Severe arthropathy of the acromioclavicular joint with
hypertrophic changes along the undersurface deforming the anterior
supraspinatus musculotendinous junction.

## 2018-08-11 ENCOUNTER — Encounter: Payer: Self-pay | Admitting: Internal Medicine

## 2018-08-11 ENCOUNTER — Ambulatory Visit (INDEPENDENT_AMBULATORY_CARE_PROVIDER_SITE_OTHER): Payer: Medicare Other | Admitting: Internal Medicine

## 2018-08-11 ENCOUNTER — Other Ambulatory Visit: Payer: Self-pay

## 2018-08-11 ENCOUNTER — Other Ambulatory Visit (INDEPENDENT_AMBULATORY_CARE_PROVIDER_SITE_OTHER): Payer: Medicare Other

## 2018-08-11 VITALS — BP 130/80 | HR 90 | Temp 97.8°F | Resp 16 | Ht 68.75 in | Wt 188.0 lb

## 2018-08-11 DIAGNOSIS — E781 Pure hyperglyceridemia: Secondary | ICD-10-CM

## 2018-08-11 DIAGNOSIS — E118 Type 2 diabetes mellitus with unspecified complications: Secondary | ICD-10-CM

## 2018-08-11 DIAGNOSIS — Z23 Encounter for immunization: Secondary | ICD-10-CM | POA: Diagnosis not present

## 2018-08-11 LAB — BASIC METABOLIC PANEL
BUN: 12 mg/dL (ref 6–23)
CO2: 28 mEq/L (ref 19–32)
Calcium: 9.5 mg/dL (ref 8.4–10.5)
Chloride: 105 mEq/L (ref 96–112)
Creatinine, Ser: 0.89 mg/dL (ref 0.40–1.50)
GFR: 83.79 mL/min (ref >60.00)
Glucose, Bld: 133 mg/dL — ABNORMAL HIGH (ref 70–99)
Potassium: 3.9 mEq/L (ref 3.5–5.1)
Sodium: 141 mEq/L (ref 135–145)

## 2018-08-11 LAB — HEMOGLOBIN A1C: Hgb A1c MFr Bld: 6.6 % — ABNORMAL HIGH (ref 4.6–6.5)

## 2018-08-11 LAB — TRIGLYCERIDES: Triglycerides: 142 mg/dL (ref 0.0–149.0)

## 2018-08-11 MED ORDER — ACCU-CHEK GUIDE ME W/DEVICE KIT: 1.0000 | PACK | Freq: Two times a day (BID) | 0 refills | Status: AC

## 2018-08-11 NOTE — Progress Notes (Signed)
Subjective:  Patient ID: Taylor Torres, male    DOB: Jan 12, 1946  Age: 73 y.o. MRN: [id number]  CC: Hyperlipidemia, Hypertension, and Diabetes   HPI Taylor Torres presents for f/up - He has lost weight with lifestyle modifications and tells me that his blood sugars have been well controlled.  Outpatient Medications Prior to Visit  Medication Sig Dispense Refill   atorvastatin (LIPITOR) 40 MG tablet Take 1 tablet (40 mg total) by mouth daily. 90 tablet 1   B Complex-Biotin-FA (B-COMPLEX PO) Take 1 tablet by mouth daily.       Cholecalciferol (VITAMIN D3) 1000 units CAPS Take 2,000 Units by mouth.     CINNAMON PO Take 1 capsule by mouth daily.     Coenzyme Q10 (CO Q-10) 120 MG CAPS Take 1 capsule by mouth daily.      Flax Oil-Fish Oil-Borage Oil (FISH OIL-FLAX OIL-BORAGE OIL PO) Take 1 each by mouth daily.     MAGNESIUM-ZINC PO Take 1 tablet by mouth daily.     metFORMIN (GLUCOPHAGE-XR) 750 MG 24 hr tablet TAKE 2 TABLETS BY MOUTH  DAILY WITH BREAKFAST 180 tablet 1   Misc Natural Products (BLACK CHERRY CONCENTRATE PO) Take 1 each by mouth daily.      Misc Natural Products (GLUCOSAMINE CHOND COMPLEX/MSM PO) Take 1 each by mouth daily.      Multiple Vitamin (MULTI-VITAMIN DAILY PO) Take 1 each by mouth daily.      Nutritional Supplements (PYCNOGENOL PO) Take 1 each by mouth daily.      No facility-administered medications prior to visit.     ROS Review of Systems  Constitutional: Negative.  Negative for diaphoresis, fatigue and unexpected weight change.  HENT: Negative.   Eyes: Negative for visual disturbance.  Respiratory: Negative for cough, chest tightness, shortness of breath and wheezing.   Cardiovascular: Negative for chest pain, palpitations and leg swelling.  Gastrointestinal: Negative for abdominal pain, constipation, diarrhea, nausea and vomiting.  Endocrine: Negative.  Negative for polydipsia, polyphagia and polyuria.  Genitourinary: Negative.  Negative for  difficulty urinating and dysuria.  Musculoskeletal: Negative for arthralgias.  Skin: Negative.  Negative for color change.  Neurological: Negative.  Negative for dizziness, weakness and light-headedness.  Hematological: Negative for adenopathy. Does not bruise/bleed easily.  Psychiatric/Behavioral: Negative.     Objective:  BP 130/80 (BP Location: Left Arm, Patient Position: Sitting, Cuff Size: Normal)    Pulse 90    Temp 97.8 F (36.6 C) (Oral)    Resp 16    Ht 5' 8.75" (1.746 m)    Wt 188 lb (85.3 kg)    SpO2 97%    BMI 27.97 kg/m   BP Readings from Last 3 Encounters:  08/11/18 130/80  02/21/18 130/60  08/19/17 124/64    Wt Readings from Last 3 Encounters:  08/11/18 188 lb (85.3 kg)  02/21/18 194 lb (88 kg)  08/19/17 185 lb 12 oz (84.3 kg)    Physical Exam Vitals signs reviewed.  Constitutional:      Appearance: He is not ill-appearing or diaphoretic.  HENT:     Nose: Nose normal. No congestion or rhinorrhea.     Mouth/Throat:     Mouth: Mucous membranes are moist.     Pharynx: No oropharyngeal exudate.  Eyes:     General: No scleral icterus.    Conjunctiva/sclera: Conjunctivae normal.  Neck:     Musculoskeletal: Normal range of motion. No neck rigidity.  Cardiovascular:     Rate and Rhythm: Normal rate  and regular rhythm.     Heart sounds: No murmur. No gallop.   Pulmonary:     Effort: Pulmonary effort is normal.     Breath sounds: No stridor. No wheezing, rhonchi or rales.  Abdominal:     General: Abdomen is flat. Bowel sounds are normal.     Palpations: There is no hepatomegaly or splenomegaly.     Tenderness: There is no abdominal tenderness.  Musculoskeletal: Normal range of motion.     Right lower leg: No edema.     Left lower leg: No edema.  Lymphadenopathy:     Cervical: No cervical adenopathy.  Skin:    General: Skin is warm and dry.     Coloration: Skin is not pale.     Findings: No rash.  Neurological:     General: No focal deficit present.    Psychiatric:        Mood and Affect: Mood normal.        Behavior: Behavior normal.     Lab Results  Component Value Date   WBC 6.0 02/21/2018   HGB 14.7 02/21/2018   HCT 42.3 02/21/2018   PLT 179.0 02/21/2018   GLUCOSE 133 (H) 08/11/2018   CHOL 110 02/21/2018   TRIG 142.0 08/11/2018   HDL 34.60 (L) 02/21/2018   LDLDIRECT 87.0 02/03/2016   LDLCALC 40 02/21/2018   ALT 20 02/21/2018   AST 16 02/21/2018   NA 141 08/11/2018   K 3.9 08/11/2018   CL 105 08/11/2018   CREATININE 0.89 08/11/2018   BUN 12 08/11/2018   CO2 28 08/11/2018   TSH 1.24 02/21/2018   PSA 0.49 02/21/2018   HGBA1C 6.6 (H) 08/11/2018   MICROALBUR 1.1 02/21/2018    Mr Shoulder Left W Contrast  Result Date: 05/19/2017 CLINICAL DATA:  Left shoulder pain and limited range of motion for 8 months. Painful range of motion. EXAM: MR ARTHROGRAM OF THE LEFT SHOULDER TECHNIQUE: Multiplanar, multisequence MR imaging of the left shoulder was performed following the administration of intra-articular contrast. CONTRAST:  See Injection Documentation. COMPARISON:  None. FINDINGS: Rotator cuff: Moderate tendinosis of the supraspinatus tendon with a small partial-thickness articular-surface tear. Mild tendinosis of the infraspinatus tendon. Teres minor tendon is intact. Moderate tendinosis of the subscapularis tendon with an insertional interstitial tear. Muscles: No atrophy or fatty replacement of nor abnormal signal within, the muscles of the rotator cuff. Biceps long head: Severe tendinosis of the intraarticular portion of the long head of the biceps tendon. Moderate tendinosis of the proximal extra-articular portion of the long head of the biceps tendon. Acromioclavicular Joint: Severe arthropathy of the acromioclavicular joint with hypertrophic changes along the undersurface deforming the anterior supraspinatus musculotendinous junction. Type I acromion. No significant subacromial/subdeltoid bursal fluid. Glenohumeral Joint:  Intraarticular contrast distending the joint capsule. Normal glenohumeral ligaments. Partial-thickness cartilage loss of the glenohumeral joint with areas of high-grade partial-thickness cartilage loss along the superior humeral head. Labrum: Mild diffuse labral degeneration.  No discrete labral tear. Bones: No acute osseous abnormality.  No aggressive osseous lesion. IMPRESSION: 1. Moderate tendinosis of the supraspinatus tendon with a small partial-thickness articular-surface tear. 2. Mild tendinosis of the infraspinatus tendon. 3. Moderate tendinosis of the subscapularis tendon with an insertional interstitial tear. 4. Severe tendinosis of the intraarticular portion of the long head of the biceps tendon. Moderate tendinosis of the proximal extra-articular portion of the long head of the biceps tendon. 5. 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 6. Severe arthropathy of the acromioclavicular joint with  hypertrophic changes along the undersurface deforming the anterior supraspinatus musculotendinous junction. Electronically Signed   By: Kathreen Devoid   On: 05/19/2017 16:08   Dg Fluoro Guided Needle Plc Aspiration/injection Loc  Result Date: 05/18/2017 CLINICAL DATA:  Left shoulder pain. FLUOROSCOPY TIME:  Radiation Exposure Index (as provided by the fluoroscopic device): 7.26 uGy*m2 If the device does not provide the exposure index: Fluoroscopy Time:  8 seconds Number of Acquired Images:  0 PROCEDURE: Left SHOULDER INJECTION UNDER FLUOROSCOPY An appropriate skin entrance site was determined. The site was marked, prepped with Betadine, draped in the usual sterile fashion, and infiltrated locally with buffered Lidocaine. 22 gauge spinal needle was advanced to the superomedial margin of the humeral head under intermittent fluoroscopy. 1 ml of Lidocaine injected easily. A mixture of 0.1 ml Multihance and 20 ml of dilute Isovue 200 was then used to opacify the left shoulder capsule. No immediate  complication. IMPRESSION: Technically successful left shoulder injection for MRI. Electronically Signed   By: San Morelle M.D.   On: 05/18/2017 16:11    Assessment & Plan:   Taylor Torres was seen today for hyperlipidemia, hypertension and diabetes.  Diagnoses and all orders for this visit:  Type 2 diabetes mellitus with complication, without long-term current use of insulin ([place])- His A1c is at 6.6%.  His blood sugars are adequately well controlled.  He was praised for his lifestyle modifications. -     Basic metabolic panel; Future -     Hemoglobin A1c; Future -     Blood Glucose Monitoring Suppl (ACCU-CHEK GUIDE ME) w/Device KIT; 1 Act by Does not apply route 2 (two) times a day.  Pure hypertriglyceridemia- His triglycerides are normal now. -     Triglycerides; Future  Need for hepatitis A and B vaccination -     Hepatitis A hepatitis B combined vaccine IM   I am having Taylor Torres. Taylor Torres start on Accu-Chek Guide Me. I am also having him maintain his B Complex-Biotin-FA (B-COMPLEX PO), MAGNESIUM-ZINC PO, Vitamin D3, CINNAMON PO, Nutritional Supplements (PYCNOGENOL PO), Co Q-10, Misc Natural Products (BLACK CHERRY CONCENTRATE PO), Misc Natural Products (GLUCOSAMINE CHOND COMPLEX/MSM PO), Multiple Vitamin (MULTI-VITAMIN DAILY PO), Flax Oil-Fish Oil-Borage Oil (FISH OIL-FLAX OIL-BORAGE OIL PO), atorvastatin, and metFORMIN.  Meds ordered this encounter  Medications   Blood Glucose Monitoring Suppl (ACCU-CHEK GUIDE ME) w/Device KIT    Sig: 1 Act by Does not apply route 2 (two) times a day.    Dispense:  2 kit    Refill:  0     Follow-up: Return in about 6 months (around 02/10/2019).  Scarlette Calico, MD

## 2018-08-11 NOTE — Patient Instructions (Signed)
Type 2 Diabetes Mellitus, Diagnosis, Adult Type 2 diabetes (type 2 diabetes mellitus) is a long-term (chronic) disease. In type 2 diabetes, one or both of these problems may be present:  The pancreas does not make enough of a hormone called insulin.  Cells in the body do not respond properly to insulin that the body makes (insulin resistance). Normally, insulin allows blood sugar (glucose) to enter cells in the body. The cells use glucose for energy. Insulin resistance or lack of insulin causes excess glucose to build up in the blood instead of going into cells. As a result, high blood glucose (hyperglycemia) develops. What increases the risk? The following factors may make you more likely to develop type 2 diabetes:  Having a family member with type 2 diabetes.  Being overweight or obese.  Having an inactive (sedentary) lifestyle.  Having been diagnosed with insulin resistance.  Having a history of prediabetes, gestational diabetes, or polycystic ovary syndrome (PCOS).  Being of American-Indian, African-American, Hispanic/Latino, or Asian/Pacific Islander descent. What are the signs or symptoms? In the early stage of this condition, you may not have symptoms. Symptoms develop slowly and may include:  Increased thirst (polydipsia).  Increased hunger(polyphagia).  Increased urination (polyuria).  Increased urination during the night (nocturia).  Unexplained weight loss.  Frequent infections that keep coming back (recurring).  Fatigue.  Weakness.  Vision changes, such as blurry vision.  Cuts or bruises that are slow to heal.  Tingling or numbness in the hands or feet.  Dark patches on the skin (acanthosis nigricans). How is this diagnosed? This condition is diagnosed based on your symptoms, your medical history, a physical exam, and your blood glucose level. Your blood glucose may be checked with one or more of the following blood tests:  A fasting blood glucose (FBG)  test. You will not be allowed to eat (you will fast) for 8 hours or longer before a blood sample is taken.  A random blood glucose test. This test checks blood glucose at any time of day regardless of when you ate.  An A1c (hemoglobin A1c) blood test. This test provides information about blood glucose control over the previous 2-3 months.  An oral glucose tolerance test (OGTT). This test measures your blood glucose at two times: ? After fasting. This is your baseline blood glucose level. ? Two hours after drinking a beverage that contains glucose. You may be diagnosed with type 2 diabetes if:  Your FBG level is 126 mg/dL (7.0 mmol/L) or higher.  Your random blood glucose level is 200 mg/dL (11.1 mmol/L) or higher.  Your A1c level is 6.5% or higher.  Your OGTT result is higher than 200 mg/dL (11.1 mmol/L). These blood tests may be repeated to confirm your diagnosis. How is this treated? Your treatment may be managed by a specialist called an endocrinologist. Type 2 diabetes may be treated by following instructions from your health care provider about:  Making diet and lifestyle changes. This may include: ? Following an individualized nutrition plan that is developed by a diet and nutrition specialist (registered dietitian). ? Exercising regularly. ? Finding ways to manage stress.  Checking your blood glucose level as often as told.  Taking diabetes medicines or insulin daily. This helps to keep your blood glucose levels in the healthy range. ? If you use insulin, you may need to adjust the dosage depending on how physically active you are and what foods you eat. Your health care provider will tell you how to adjust your dosage.    Taking medicines to help prevent complications from diabetes, such as: ? Aspirin. ? Medicine to lower cholesterol. ? Medicine to control blood pressure. Your health care provider will set individualized treatment goals for you. Your goals will be based on  your age, other medical conditions you have, and how you respond to diabetes treatment. Generally, the goal of treatment is to maintain the following blood glucose levels:  Before meals (preprandial): 80-130 mg/dL (4.4-7.2 mmol/L).  After meals (postprandial): below 180 mg/dL (10 mmol/L).  A1c level: less than 7%. Follow these instructions at home: Questions to ask your health care provider  Consider asking the following questions: ? Do I need to meet with a diabetes educator? ? Where can I find a support group for people with diabetes? ? What equipment will I need to manage my diabetes at home? ? What diabetes medicines do I need, and when should I take them? ? How often do I need to check my blood glucose? ? What number can I call if I have questions? ? When is my next appointment? General instructions  Take over-the-counter and prescription medicines only as told by your health care provider.  Keep all follow-up visits as told by your health care provider. This is important.  For more information about diabetes, visit: ? American Diabetes Association (ADA): www.diabetes.org ? American Association of Diabetes Educators (AADE): www.diabeteseducator.org Contact a health care provider if:  Your blood glucose is at or above 240 mg/dL (13.3 mmol/L) for 2 days in a row.  You have been sick or have had a fever for 2 days or longer, and you are not getting better.  You have any of the following problems for more than 6 hours: ? You cannot eat or drink. ? You have nausea and vomiting. ? You have diarrhea. Get help right away if:  Your blood glucose is lower than 54 mg/dL (3.0 mmol/L).  You become confused or you have trouble thinking clearly.  You have difficulty breathing.  You have moderate or large ketone levels in your urine. Summary  Type 2 diabetes (type 2 diabetes mellitus) is a long-term (chronic) disease. In type 2 diabetes, the pancreas does not make enough of a  hormone called insulin, or cells in the body do not respond properly to insulin that the body makes (insulin resistance).  This condition is treated by making diet and lifestyle changes and taking diabetes medicines or insulin.  Your health care provider will set individualized treatment goals for you. Your goals will be based on your age, other medical conditions you have, and how you respond to diabetes treatment.  Keep all follow-up visits as told by your health care provider. This is important. This information is not intended to replace advice given to you by your health care provider. Make sure you discuss any questions you have with your health care provider. Document Released: 02/09/2005 Document Revised: 09/10/2016 Document Reviewed: 03/15/2015 [employer] Interactive Patient Education  2019 [employer]  

## 2018-10-05 ENCOUNTER — Other Ambulatory Visit: Payer: Self-pay | Admitting: Internal Medicine

## 2018-10-05 DIAGNOSIS — E785 Hyperlipidemia, unspecified: Secondary | ICD-10-CM

## 2018-10-05 DIAGNOSIS — E118 Type 2 diabetes mellitus with unspecified complications: Secondary | ICD-10-CM

## 2018-12-15 ENCOUNTER — Other Ambulatory Visit: Payer: Self-pay | Admitting: Internal Medicine

## 2018-12-15 DIAGNOSIS — E118 Type 2 diabetes mellitus with unspecified complications: Secondary | ICD-10-CM

## 2019-02-07 ENCOUNTER — Other Ambulatory Visit: Payer: Self-pay

## 2019-02-07 ENCOUNTER — Other Ambulatory Visit (INDEPENDENT_AMBULATORY_CARE_PROVIDER_SITE_OTHER): Payer: Medicare Other

## 2019-02-07 ENCOUNTER — Encounter: Payer: Self-pay | Admitting: Internal Medicine

## 2019-02-07 ENCOUNTER — Ambulatory Visit (INDEPENDENT_AMBULATORY_CARE_PROVIDER_SITE_OTHER): Payer: Medicare Other | Admitting: Internal Medicine

## 2019-02-07 VITALS — BP 146/82 | HR 67 | Temp 97.7°F | Ht 68.75 in | Wt 187.0 lb

## 2019-02-07 DIAGNOSIS — I10 Essential (primary) hypertension: Secondary | ICD-10-CM

## 2019-02-07 DIAGNOSIS — Z0000 Encounter for general adult medical examination without abnormal findings: Secondary | ICD-10-CM

## 2019-02-07 DIAGNOSIS — E781 Pure hyperglyceridemia: Secondary | ICD-10-CM | POA: Diagnosis not present

## 2019-02-07 DIAGNOSIS — E118 Type 2 diabetes mellitus with unspecified complications: Secondary | ICD-10-CM | POA: Diagnosis not present

## 2019-02-07 DIAGNOSIS — E785 Hyperlipidemia, unspecified: Secondary | ICD-10-CM

## 2019-02-07 DIAGNOSIS — N400 Enlarged prostate without lower urinary tract symptoms: Secondary | ICD-10-CM | POA: Diagnosis not present

## 2019-02-07 DIAGNOSIS — Z23 Encounter for immunization: Secondary | ICD-10-CM | POA: Diagnosis not present

## 2019-02-07 LAB — CBC WITH DIFFERENTIAL/PLATELET
Basophils Absolute: 0 10*3/uL (ref 0.0–0.1)
Basophils Relative: 0.6 % (ref 0.0–3.0)
Eosinophils Absolute: 0.2 10*3/uL (ref 0.0–0.7)
Eosinophils Relative: 3.2 % (ref 0.0–5.0)
HCT: 42.9 % (ref 39.0–52.0)
Hemoglobin: 14.6 g/dL (ref 13.0–17.0)
Lymphocytes Relative: 27.1 % (ref 12.0–46.0)
Lymphs Abs: 1.6 10*3/uL (ref 0.7–4.0)
MCHC: 34 g/dL (ref 30.0–36.0)
MCV: 92.2 fl (ref 78.0–100.0)
Monocytes Absolute: 0.5 10*3/uL (ref 0.1–1.0)
Monocytes Relative: 8 % (ref 3.0–12.0)
Neutro Abs: 3.7 10*3/uL (ref 1.4–7.7)
Neutrophils Relative %: 61.1 % (ref 43.0–77.0)
Platelets: 180 10*3/uL (ref 150.0–400.0)
RBC: 4.65 Mil/uL (ref 4.22–5.81)
RDW: 12.2 % (ref 11.5–15.5)
WBC: 6 10*3/uL (ref 4.0–10.5)

## 2019-02-07 LAB — BASIC METABOLIC PANEL
BUN: 20 mg/dL (ref 6–23)
CO2: 26 mEq/L (ref 19–32)
Calcium: 9.7 mg/dL (ref 8.4–10.5)
Chloride: 103 mEq/L (ref 96–112)
Creatinine, Ser: 0.87 mg/dL (ref 0.40–1.50)
GFR: 85.9 mL/min (ref >60.00)
Glucose, Bld: 99 mg/dL (ref 70–99)
Potassium: 4 mEq/L (ref 3.5–5.1)
Sodium: 139 mEq/L (ref 135–145)

## 2019-02-07 LAB — URINALYSIS, ROUTINE W REFLEX MICROSCOPIC
Bilirubin Urine: NEGATIVE
Hgb urine dipstick: NEGATIVE
Ketones, ur: NEGATIVE
Leukocytes,Ua: NEGATIVE
Nitrite: NEGATIVE
Specific Gravity, Urine: >=1.03 — AB (ref 1.000–1.030)
Total Protein, Urine: NEGATIVE
Urine Glucose: NEGATIVE
Urobilinogen, UA: 0.2 (ref 0.0–1.0)
pH: 5 (ref 5.0–8.0)

## 2019-02-07 LAB — HEPATIC FUNCTION PANEL
ALT: 19 U/L (ref 0–53)
AST: 20 U/L (ref 0–37)
Albumin: 4.6 g/dL (ref 3.5–5.2)
Alkaline Phosphatase: 58 U/L (ref 39–117)
Bilirubin, Direct: 0.1 mg/dL (ref 0.0–0.3)
Total Bilirubin: 0.5 mg/dL (ref 0.2–1.2)
Total Protein: 7.4 g/dL (ref 6.0–8.3)

## 2019-02-07 LAB — LIPID PANEL
Cholesterol: 118 mg/dL (ref 0–200)
HDL: 38.1 mg/dL — ABNORMAL LOW (ref >39.00)
LDL Cholesterol: 54 mg/dL (ref 0–99)
NonHDL: 80.39
Total CHOL/HDL Ratio: 3
Triglycerides: 134 mg/dL (ref 0.0–149.0)
VLDL: 26.8 mg/dL (ref 0.0–40.0)

## 2019-02-07 LAB — MICROALBUMIN / CREATININE URINE RATIO
Creatinine,U: 139.9 mg/dL
Microalb Creat Ratio: 1.4 mg/g (ref 0.0–30.0)
Microalb, Ur: 2 mg/dL — ABNORMAL HIGH (ref 0.0–1.9)

## 2019-02-07 LAB — TSH: TSH: 1.21 u[IU]/mL (ref 0.35–4.50)

## 2019-02-07 LAB — PSA: PSA: 0.57 ng/mL (ref 0.10–4.00)

## 2019-02-07 LAB — HEMOGLOBIN A1C: Hgb A1c MFr Bld: 6.2 % (ref 4.6–6.5)

## 2019-02-07 MED ORDER — EDARBI 40 MG PO TABS: 1.0000 | ORAL_TABLET | Freq: Every day | ORAL | 1 refills | Status: DC

## 2019-02-07 MED ORDER — ATORVASTATIN CALCIUM 40 MG PO TABS: 40.0000 mg | ORAL_TABLET | Freq: Every day | ORAL | 1 refills | Status: DC

## 2019-02-07 NOTE — Progress Notes (Signed)
Subjective:  Patient ID: Taylor Torres, male    DOB: [birth year]  Age: 73 y.o. MRN: [id number]  CC: Annual Exam, Hypertension, Hyperlipidemia, and Diabetes  This visit occurred during the SARS-CoV-2 public health emergency.  Safety protocols were in place, including screening questions prior to the visit, additional usage of staff PPE, and extensive cleaning of exam room while observing appropriate contact time as indicated for disinfecting solutions.   HPI Taylor Torres presents for a CPX.  He has felt well recently and offers no complaints.  He is very active and denies any recent episodes of chest pain, shortness of breath, diaphoresis, palpitations, edema, or fatigue.  He tells me his blood sugars have been well controlled.  He does not monitor his blood pressure.  Outpatient Medications Prior to Visit  Medication Sig Dispense Refill  . B Complex-Biotin-FA (B-COMPLEX PO) Take 1 tablet by mouth daily.      . Blood Glucose Monitoring Suppl (ACCU-CHEK GUIDE ME) w/Device KIT 1 Act by Does not apply route 2 (two) times a day. 2 kit 0  . Cholecalciferol (VITAMIN D3) 1000 units CAPS Take 2,000 Units by mouth.    Marland Kitchen CINNAMON PO Take 1 capsule by mouth daily.    . Coenzyme Q10 (CO Q-10) 120 MG CAPS Take 1 capsule by mouth daily.     . Flax Oil-Fish Oil-Borage Oil (FISH OIL-FLAX OIL-BORAGE OIL PO) Take 1 each by mouth daily.    Marland Kitchen MAGNESIUM-ZINC PO Take 1 tablet by mouth daily.    . Misc Natural Products (BLACK CHERRY CONCENTRATE PO) Take 1 each by mouth daily.     . Misc Natural Products (GLUCOSAMINE CHOND COMPLEX/MSM PO) Take 1 each by mouth daily.     . Multiple Vitamin (MULTI-VITAMIN DAILY PO) Take 1 each by mouth daily.     Marland Kitchen atorvastatin (LIPITOR) 40 MG tablet TAKE 1 TABLET BY MOUTH  DAILY 90 tablet 1  . metFORMIN (GLUCOPHAGE-XR) 750 MG 24 hr tablet TAKE 2 TABLETS BY MOUTH  DAILY WITH BREAKFAST 180 tablet 3  . Nutritional Supplements (PYCNOGENOL PO) Take 1 each by mouth daily.      No  facility-administered medications prior to visit.    ROS Review of Systems  Constitutional: Negative for diaphoresis, fatigue and unexpected weight change.  HENT: Negative.   Eyes: Negative for visual disturbance.  Respiratory: Negative for cough, chest tightness, shortness of breath and wheezing.   Cardiovascular: Negative for chest pain, palpitations and leg swelling.  Gastrointestinal: Negative for abdominal pain, constipation, diarrhea, nausea and vomiting.  Endocrine: Negative.  Negative for cold intolerance, heat intolerance, polydipsia, polyphagia and polyuria.  Genitourinary: Negative.  Negative for difficulty urinating, scrotal swelling and testicular pain.  Musculoskeletal: Negative.  Negative for arthralgias and myalgias.  Skin: Negative.  Negative for color change, pallor and rash.  Neurological: Negative.  Negative for dizziness, weakness and light-headedness.  Hematological: Negative for adenopathy. Does not bruise/bleed easily.  Psychiatric/Behavioral: Negative.     Objective:  BP (!) 146/82 (BP Location: Left Arm, Patient Position: Sitting, Cuff Size: Normal)   Pulse 67   Temp 97.7 F (36.5 C) (Oral)   Ht 5' 8.75" (1.746 m)   Wt 187 lb (84.8 kg)   SpO2 98%   BMI 27.82 kg/m   BP Readings from Last 3 Encounters:  02/07/19 (!) 146/82  08/11/18 130/80  02/21/18 130/60    Wt Readings from Last 3 Encounters:  02/07/19 187 lb (84.8 kg)  08/11/18 188 lb (85.3 kg)  02/21/18  194 lb (88 kg)    Physical Exam Vitals reviewed.  Constitutional:      Appearance: Normal appearance.  HENT:     Nose: Nose normal.     Mouth/Throat:     Mouth: Mucous membranes are moist.  Eyes:     General: No scleral icterus.    Conjunctiva/sclera: Conjunctivae normal.  Cardiovascular:     Rate and Rhythm: Normal rate and regular rhythm.     Heart sounds: No murmur.  Pulmonary:     Effort: Pulmonary effort is normal.     Breath sounds: No stridor. No wheezing, rhonchi or rales.    Abdominal:     General: Abdomen is flat. Bowel sounds are normal. There is no distension.     Palpations: Abdomen is soft. There is no hepatomegaly or splenomegaly.     Tenderness: There is no abdominal tenderness.     Hernia: There is no hernia in the left inguinal area or right inguinal area.  Genitourinary:    Pubic Area: No rash.      Penis: Normal and circumcised. No discharge, swelling or lesions.      Testes: Normal.        Right: Mass not present.        Left: Mass not present.     Epididymis:     Right: Normal. Not inflamed or enlarged. No mass.     Left: Normal. Not inflamed or enlarged. No mass.     Prostate: Enlarged (1+ smooth symm BPH). Not tender and no nodules present.     Rectum: Normal. Guaiac result negative. No mass, tenderness, anal fissure, external hemorrhoid or internal hemorrhoid. Normal anal tone.  Musculoskeletal:        General: Normal range of motion.     Cervical back: Neck supple.     Right lower leg: No edema.     Left lower leg: No edema.  Lymphadenopathy:     Cervical: No cervical adenopathy.     Lower Body: No right inguinal adenopathy. No left inguinal adenopathy.  Skin:    General: Skin is warm and dry.  Neurological:     General: No focal deficit present.     Mental Status: He is alert.  Psychiatric:        Mood and Affect: Mood normal.        Behavior: Behavior normal.     Lab Results  Component Value Date   WBC 6.0 02/07/2019   HGB 14.6 02/07/2019   HCT 42.9 02/07/2019   PLT 180.0 02/07/2019   GLUCOSE 99 02/07/2019   CHOL 118 02/07/2019   TRIG 134.0 02/07/2019   HDL 38.10 (L) 02/07/2019   LDLDIRECT 87.0 02/03/2016   LDLCALC 54 02/07/2019   ALT 19 02/07/2019   AST 20 02/07/2019   NA 139 02/07/2019   K 4.0 02/07/2019   CL 103 02/07/2019   CREATININE 0.87 02/07/2019   BUN 20 02/07/2019   CO2 26 02/07/2019   TSH 1.21 02/07/2019   PSA 0.57 02/07/2019   HGBA1C 6.2 02/07/2019   MICROALBUR 2.0 (H) 02/07/2019    MR SHOULDER  LEFT W CONTRAST  Result Date: 05/19/2017 CLINICAL DATA:  Left shoulder pain and limited range of motion for 8 months. Painful range of motion. EXAM: MR ARTHROGRAM OF THE LEFT SHOULDER TECHNIQUE: Multiplanar, multisequence MR imaging of the left shoulder was performed following the administration of intra-articular contrast. CONTRAST:  See Injection Documentation. COMPARISON:  None. FINDINGS: Rotator cuff: Moderate tendinosis of the supraspinatus tendon  with a small partial-thickness articular-surface tear. Mild tendinosis of the infraspinatus tendon. Teres minor tendon is intact. Moderate tendinosis of the subscapularis tendon with an insertional interstitial tear. Muscles: No atrophy or fatty replacement of nor abnormal signal within, the muscles of the rotator cuff. Biceps long head: Severe tendinosis of the intraarticular portion of the long head of the biceps tendon. Moderate tendinosis of the proximal extra-articular portion of the long head of the biceps tendon. Acromioclavicular Joint: Severe arthropathy of the acromioclavicular joint with hypertrophic changes along the undersurface deforming the anterior supraspinatus musculotendinous junction. Type I acromion. No significant subacromial/subdeltoid bursal fluid. Glenohumeral Joint: Intraarticular contrast distending the joint capsule. Normal glenohumeral ligaments. Partial-thickness cartilage loss of the glenohumeral joint with areas of high-grade partial-thickness cartilage loss along the superior humeral head. Labrum: Mild diffuse labral degeneration.  No discrete labral tear. Bones: No acute osseous abnormality.  No aggressive osseous lesion. IMPRESSION: 1. Moderate tendinosis of the supraspinatus tendon with a small partial-thickness articular-surface tear. 2. Mild tendinosis of the infraspinatus tendon. 3. Moderate tendinosis of the subscapularis tendon with an insertional interstitial tear. 4. Severe tendinosis of the intraarticular portion of the  long head of the biceps tendon. Moderate tendinosis of the proximal extra-articular portion of the long head of the biceps tendon. 5. 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 6. Severe arthropathy of the acromioclavicular joint with hypertrophic changes along the undersurface deforming the anterior supraspinatus musculotendinous junction. Electronically Signed   By: Kathreen Devoid   On: 05/19/2017 16:08   DG FLUORO GUIDED NEEDLE PLC ASPIRATION/INJECTION LOC  Result Date: 05/18/2017 CLINICAL DATA:  Left shoulder pain. FLUOROSCOPY TIME:  Radiation Exposure Index (as provided by the fluoroscopic device): 7.26 uGy*m2 If the device does not provide the exposure index: Fluoroscopy Time:  8 seconds Number of Acquired Images:  0 PROCEDURE: Left SHOULDER INJECTION UNDER FLUOROSCOPY An appropriate skin entrance site was determined. The site was marked, prepped with Betadine, draped in the usual sterile fashion, and infiltrated locally with buffered Lidocaine. 22 gauge spinal needle was advanced to the superomedial margin of the humeral head under intermittent fluoroscopy. 1 ml of Lidocaine injected easily. A mixture of 0.1 ml Multihance and 20 ml of dilute Isovue 200 was then used to opacify the left shoulder capsule. No immediate complication. IMPRESSION: Technically successful left shoulder injection for MRI. Electronically Signed   By: San Morelle M.D.   On: 05/18/2017 16:11    Assessment & Plan:   Nijel was seen today for annual exam, hypertension, hyperlipidemia and diabetes.  Diagnoses and all orders for this visit:  Type 2 diabetes mellitus with complication, without long-term current use of insulin ([place])- His A1c is down to 6.2%.  He has achieved excellent glycemic control.  I recommended that he decrease his metformin dose to 750 mg once a day. -     HM Diabetes Foot Exam -     atorvastatin (LIPITOR) 40 MG tablet; Take 1 tablet (40 mg total) by mouth daily. -     Azilsartan  Medoxomil (EDARBI) 40 MG TABS; Take 1 tablet by mouth daily. -     Basic metabolic panel; Future -     Hemoglobin A1c; Future -     Microalbumin / creatinine urine ratio; Future  BPH without obstruction/lower urinary tract symptoms- His PSA is low which is reassuring that he does not have prostate cancer.  He has no symptoms that need to be treated. -     Urinalysis, Routine w reflex microscopic; Future -  PSA; Future  Hyperlipidemia with target LDL less than 130- He has achieved his LDL goal and is doing well on the statin. -     atorvastatin (LIPITOR) 40 MG tablet; Take 1 tablet (40 mg total) by mouth daily. -     Lipid panel; Future -     TSH; Future -     Hepatic function panel; Future  Routine general medical examination at a health care facility- Exam completed, labs reviewed, vaccines reviewed and updated, cancer screenings are up-to-date, patient education was given.  Pure hypertriglyceridemia- His triglycerides are normal now.  Essential hypertension- His blood pressure is not adequately well controlled.  His labs are negative for secondary causes or endorgan damage.  I recommended that he start taking an ARB. -     Azilsartan Medoxomil (EDARBI) 40 MG TABS; Take 1 tablet by mouth daily. -     CBC with Differential; Future -     Basic metabolic panel; Future -     TSH; Future -     Urinalysis, Routine w reflex microscopic; Future  Need for pneumococcal vaccination -     Pneumococcal polysaccharide vaccine 23-valent greater than or equal to 2yo subcutaneous/IM  Other orders -     Cancel: EKG 12-Lead   I have discontinued [name]. Nieland's Nutritional Supplements (PYCNOGENOL PO). I have also changed his atorvastatin and metFORMIN. Additionally, I am having him start on Edarbi. Lastly, I am having him maintain his B Complex-Biotin-FA (B-COMPLEX PO), MAGNESIUM-ZINC PO, Vitamin D3, CINNAMON PO, Co Q-10, Misc Natural Products (BLACK CHERRY CONCENTRATE PO), Misc Natural Products  (GLUCOSAMINE CHOND COMPLEX/MSM PO), Multiple Vitamin (MULTI-VITAMIN DAILY PO), Flax Oil-Fish Oil-Borage Oil (FISH OIL-FLAX OIL-BORAGE OIL PO), and Accu-Chek Guide Me.  Meds ordered this encounter  Medications  . atorvastatin (LIPITOR) 40 MG tablet    Sig: Take 1 tablet (40 mg total) by mouth daily.    Dispense:  90 tablet    Refill:  1  . Azilsartan Medoxomil (EDARBI) 40 MG TABS    Sig: Take 1 tablet by mouth daily.    Dispense:  90 tablet    Refill:  1  . metFORMIN (GLUCOPHAGE-XR) 750 MG 24 hr tablet    Sig: Take 1 tablet (750 mg total) by mouth daily with breakfast.    Dispense:  90 tablet    Refill:  1     Follow-up: Return in about 6 months (around 08/08/2019).  Scarlette Calico, MD

## 2019-02-07 NOTE — Patient Instructions (Signed)

## 2019-02-08 ENCOUNTER — Encounter: Payer: Self-pay | Admitting: Internal Medicine

## 2019-02-08 MED ORDER — METFORMIN HCL ER 750 MG PO TB24: 750.0000 mg | ORAL_TABLET | Freq: Every day | ORAL | 1 refills | Status: DC

## 2019-02-09 ENCOUNTER — Encounter: Payer: Self-pay | Admitting: Internal Medicine

## 2019-02-23 ENCOUNTER — Encounter: Payer: Medicare Other | Admitting: Internal Medicine

## 2019-02-23 DIAGNOSIS — H524 Presbyopia: Secondary | ICD-10-CM | POA: Diagnosis not present

## 2019-03-09 NOTE — Telephone Encounter (Signed)
I did a tier exception for the Edarbi but it was denied.

## 2019-03-21 ENCOUNTER — Ambulatory Visit: Payer: Medicare Other

## 2019-03-30 ENCOUNTER — Ambulatory Visit: Payer: Medicare Other | Attending: Internal Medicine

## 2019-03-30 DIAGNOSIS — Z23 Encounter for immunization: Secondary | ICD-10-CM | POA: Insufficient documentation

## 2019-03-30 NOTE — Progress Notes (Signed)
   Covid-19 Vaccination Clinic  Name:  LYELL TOTINO    MRN: [id number] DOB: Oct 06, 1945  03/30/2019  Mr. Huebsch was observed post Covid-19 immunization for 15 minutes without incidence. He was provided with Vaccine Information Sheet and instruction to access the V-Safe system.   Mr. Kittler was instructed to call 911 with any severe reactions post vaccine: Marland Kitchen Difficulty breathing  . Swelling of your face and throat  . A fast heartbeat  . A bad rash all over your body  . Dizziness and weakness    Immunizations Administered    Name Date Dose VIS Date Route   Pfizer COVID-19 Vaccine 03/30/2019 11:26 AM 0.3 mL 02/03/2019 Intramuscular   Manufacturer: [place]   Lot: [id number]   [place]: [id number]

## 2019-04-11 ENCOUNTER — Ambulatory Visit: Payer: Medicare Other

## 2019-04-24 ENCOUNTER — Ambulatory Visit: Payer: Medicare Other | Attending: Internal Medicine

## 2019-04-24 DIAGNOSIS — Z23 Encounter for immunization: Secondary | ICD-10-CM | POA: Insufficient documentation

## 2019-04-24 NOTE — Progress Notes (Signed)
   Covid-19 Vaccination Clinic  Name:  Taylor Torres    MRN: [id number] DOB: 1946-02-07  04/24/2019  [name] was observed post Covid-19 immunization for 15 minutes without incidence. He was provided with Vaccine Information Sheet and instruction to access the V-Safe system.   [name] was instructed to call 911 with any severe reactions post vaccine: Marland Kitchen Difficulty breathing  . Swelling of your face and throat  . A fast heartbeat  . A bad rash all over your body  . Dizziness and weakness    Immunizations Administered    Name Date Dose VIS Date Route   Pfizer COVID-19 Vaccine 04/24/2019  3:26 PM 0.3 mL 02/03/2019 Intramuscular   Manufacturer: [place]   Lot: [id number]   [place]: [id number]

## 2019-05-02 DIAGNOSIS — L821 Other seborrheic keratosis: Secondary | ICD-10-CM | POA: Diagnosis not present

## 2019-05-02 DIAGNOSIS — D1801 Hemangioma of skin and subcutaneous tissue: Secondary | ICD-10-CM | POA: Diagnosis not present

## 2019-05-02 DIAGNOSIS — D225 Melanocytic nevi of trunk: Secondary | ICD-10-CM | POA: Diagnosis not present

## 2019-05-02 DIAGNOSIS — L905 Scar conditions and fibrosis of skin: Secondary | ICD-10-CM | POA: Diagnosis not present

## 2019-05-02 DIAGNOSIS — L218 Other seborrheic dermatitis: Secondary | ICD-10-CM | POA: Diagnosis not present

## 2019-06-09 ENCOUNTER — Other Ambulatory Visit: Payer: Self-pay | Admitting: Internal Medicine

## 2019-06-09 DIAGNOSIS — I10 Essential (primary) hypertension: Secondary | ICD-10-CM

## 2019-06-09 DIAGNOSIS — E118 Type 2 diabetes mellitus with unspecified complications: Secondary | ICD-10-CM

## 2019-07-28 ENCOUNTER — Other Ambulatory Visit: Payer: Self-pay | Admitting: Internal Medicine

## 2019-07-28 DIAGNOSIS — E118 Type 2 diabetes mellitus with unspecified complications: Secondary | ICD-10-CM

## 2019-08-03 ENCOUNTER — Encounter: Payer: Self-pay | Admitting: Internal Medicine

## 2019-08-04 ENCOUNTER — Other Ambulatory Visit: Payer: Self-pay | Admitting: Internal Medicine

## 2019-08-13 ENCOUNTER — Other Ambulatory Visit: Payer: Self-pay | Admitting: Internal Medicine

## 2019-08-13 DIAGNOSIS — E118 Type 2 diabetes mellitus with unspecified complications: Secondary | ICD-10-CM

## 2019-08-25 ENCOUNTER — Other Ambulatory Visit: Payer: Self-pay | Admitting: Internal Medicine

## 2019-08-25 DIAGNOSIS — E118 Type 2 diabetes mellitus with unspecified complications: Secondary | ICD-10-CM

## 2019-08-25 DIAGNOSIS — E785 Hyperlipidemia, unspecified: Secondary | ICD-10-CM

## 2019-08-29 ENCOUNTER — Other Ambulatory Visit: Payer: Self-pay | Admitting: Internal Medicine

## 2019-08-29 DIAGNOSIS — I10 Essential (primary) hypertension: Secondary | ICD-10-CM

## 2019-08-29 DIAGNOSIS — E118 Type 2 diabetes mellitus with unspecified complications: Secondary | ICD-10-CM

## 2019-09-10 ENCOUNTER — Other Ambulatory Visit: Payer: Self-pay | Admitting: Internal Medicine

## 2019-09-10 DIAGNOSIS — E118 Type 2 diabetes mellitus with unspecified complications: Secondary | ICD-10-CM

## 2019-09-10 DIAGNOSIS — E785 Hyperlipidemia, unspecified: Secondary | ICD-10-CM

## 2019-09-14 ENCOUNTER — Other Ambulatory Visit: Payer: Self-pay | Admitting: Internal Medicine

## 2019-09-14 DIAGNOSIS — I10 Essential (primary) hypertension: Secondary | ICD-10-CM

## 2019-09-14 DIAGNOSIS — E118 Type 2 diabetes mellitus with unspecified complications: Secondary | ICD-10-CM

## 2019-09-20 ENCOUNTER — Encounter: Payer: Self-pay | Admitting: Internal Medicine

## 2019-09-25 ENCOUNTER — Ambulatory Visit (INDEPENDENT_AMBULATORY_CARE_PROVIDER_SITE_OTHER): Payer: Medicare Other | Admitting: Internal Medicine

## 2019-09-25 ENCOUNTER — Other Ambulatory Visit: Payer: Self-pay

## 2019-09-25 ENCOUNTER — Encounter: Payer: Self-pay | Admitting: Internal Medicine

## 2019-09-25 VITALS — BP 132/70 | HR 80 | Temp 98.5°F | Wt 188.4 lb

## 2019-09-25 DIAGNOSIS — E118 Type 2 diabetes mellitus with unspecified complications: Secondary | ICD-10-CM | POA: Diagnosis not present

## 2019-09-25 DIAGNOSIS — I10 Essential (primary) hypertension: Secondary | ICD-10-CM

## 2019-09-25 MED ORDER — EDARBI 40 MG PO TABS: 1.0000 | ORAL_TABLET | Freq: Every day | ORAL | 0 refills | Status: DC

## 2019-09-25 NOTE — Patient Instructions (Signed)
Type 2 Diabetes Mellitus, Diagnosis, Adult Type 2 diabetes (type 2 diabetes mellitus) is a long-term (chronic) disease. In type 2 diabetes, one or both of these problems may be present:  The pancreas does not make enough of a hormone called insulin.  Cells in the body do not respond properly to insulin that the body makes (insulin resistance). Normally, insulin allows blood sugar (glucose) to enter cells in the body. The cells use glucose for energy. Insulin resistance or lack of insulin causes excess glucose to build up in the blood instead of going into cells. As a result, high blood glucose (hyperglycemia) develops. What increases the risk? The following factors may make you more likely to develop type 2 diabetes:  Having a family member with type 2 diabetes.  Being overweight or obese.  Having an inactive (sedentary) lifestyle.  Having been diagnosed with insulin resistance.  Having a history of prediabetes, gestational diabetes, or polycystic ovary syndrome (PCOS).  Being of American-Indian, African-American, Hispanic/Latino, or Asian/Pacific Islander descent. What are the signs or symptoms? In the early stage of this condition, you may not have symptoms. Symptoms develop slowly and may include:  Increased thirst (polydipsia).  Increased hunger(polyphagia).  Increased urination (polyuria).  Increased urination during the night (nocturia).  Unexplained weight loss.  Frequent infections that keep coming back (recurring).  Fatigue.  Weakness.  Vision changes, such as blurry vision.  Cuts or bruises that are slow to heal.  Tingling or numbness in the hands or feet.  Dark patches on the skin (acanthosis nigricans). How is this diagnosed? This condition is diagnosed based on your symptoms, your medical history, a physical exam, and your blood glucose level. Your blood glucose may be checked with one or more of the following blood tests:  A fasting blood glucose (FBG)  test. You will not be allowed to eat (you will fast) for 8 hours or longer before a blood sample is taken.  A random blood glucose test. This test checks blood glucose at any time of day regardless of when you ate.  An A1c (hemoglobin A1c) blood test. This test provides information about blood glucose control over the previous 2-3 months.  An oral glucose tolerance test (OGTT). This test measures your blood glucose at two times: ? After fasting. This is your baseline blood glucose level. ? Two hours after drinking a beverage that contains glucose. You may be diagnosed with type 2 diabetes if:  Your FBG level is 126 mg/dL (7.0 mmol/L) or higher.  Your random blood glucose level is 200 mg/dL (11.1 mmol/L) or higher.  Your A1c level is 6.5% or higher.  Your OGTT result is higher than 200 mg/dL (11.1 mmol/L). These blood tests may be repeated to confirm your diagnosis. How is this treated? Your treatment may be managed by a specialist called an endocrinologist. Type 2 diabetes may be treated by following instructions from your health care provider about:  Making diet and lifestyle changes. This may include: ? Following an individualized nutrition plan that is developed by a diet and nutrition specialist (registered dietitian). ? Exercising regularly. ? Finding ways to manage stress.  Checking your blood glucose level as often as told.  Taking diabetes medicines or insulin daily. This helps to keep your blood glucose levels in the healthy range. ? If you use insulin, you may need to adjust the dosage depending on how physically active you are and what foods you eat. Your health care provider will tell you how to adjust your dosage.    Taking medicines to help prevent complications from diabetes, such as: ? Aspirin. ? Medicine to lower cholesterol. ? Medicine to control blood pressure. Your health care provider will set individualized treatment goals for you. Your goals will be based on  your age, other medical conditions you have, and how you respond to diabetes treatment. Generally, the goal of treatment is to maintain the following blood glucose levels:  Before meals (preprandial): 80-130 mg/dL (4.4-7.2 mmol/L).  After meals (postprandial): below 180 mg/dL (10 mmol/L).  A1c level: less than 7%. Follow these instructions at home: Questions to ask your health care provider  Consider asking the following questions: ? Do I need to meet with a diabetes educator? ? Where can I find a support group for people with diabetes? ? What equipment will I need to manage my diabetes at home? ? What diabetes medicines do I need, and when should I take them? ? How often do I need to check my blood glucose? ? What number can I call if I have questions? ? When is my next appointment? General instructions  Take over-the-counter and prescription medicines only as told by your health care provider.  Keep all follow-up visits as told by your health care provider. This is important.  For more information about diabetes, visit: ? American Diabetes Association (ADA): www.diabetes.org ? American Association of Diabetes Educators (AADE): www.diabeteseducator.org Contact a health care provider if:  Your blood glucose is at or above 240 mg/dL (13.3 mmol/L) for 2 days in a row.  You have been sick or have had a fever for 2 days or longer, and you are not getting better.  You have any of the following problems for more than 6 hours: ? You cannot eat or drink. ? You have nausea and vomiting. ? You have diarrhea. Get help right away if:  Your blood glucose is lower than 54 mg/dL (3.0 mmol/L).  You become confused or you have trouble thinking clearly.  You have difficulty breathing.  You have moderate or large ketone levels in your urine. Summary  Type 2 diabetes (type 2 diabetes mellitus) is a long-term (chronic) disease. In type 2 diabetes, the pancreas does not make enough of a  hormone called insulin, or cells in the body do not respond properly to insulin that the body makes (insulin resistance).  This condition is treated by making diet and lifestyle changes and taking diabetes medicines or insulin.  Your health care provider will set individualized treatment goals for you. Your goals will be based on your age, other medical conditions you have, and how you respond to diabetes treatment.  Keep all follow-up visits as told by your health care provider. This is important. This information is not intended to replace advice given to you by your health care provider. Make sure you discuss any questions you have with your health care provider. Document Revised: 04/09/2017 Document Reviewed: 03/15/2015 Elsevier Patient Education  2020 [employer]  

## 2019-09-25 NOTE — Progress Notes (Signed)
Subjective:  Patient ID: Taylor Torres, male    DOB: 1945/05/16  Age: 74 y.o. MRN: [id number]  CC: Diabetes (6 month follow-up)  This visit occurred during the SARS-CoV-2 public health emergency.  Safety protocols were in place, including screening questions prior to the visit, additional usage of staff PPE, and extensive cleaning of exam room while observing appropriate contact time as indicated for disinfecting solutions.    HPI Taylor Torres presents for f/up - He tells me his blood pressure and blood sugar have been well controlled.  He is active and denies any recent episodes of chest pain, shortness of breath, diaphoresis, dizziness, lightheadedness, palpitations, edema, or fatigue.  Outpatient Medications Prior to Visit  Medication Sig Dispense Refill   atorvastatin (LIPITOR) 40 MG tablet TAKE 1 TABLET BY MOUTH  DAILY 90 tablet 0   B Complex-Biotin-FA (B-COMPLEX PO) Take 1 tablet by mouth daily.       Blood Glucose Monitoring Suppl (ACCU-CHEK GUIDE ME) w/Device KIT 1 Act by Does not apply route 2 (two) times a day. 2 kit 0   Cholecalciferol (VITAMIN D3) 1000 units CAPS Take 2,000 Units by mouth.     CINNAMON PO Take 1 capsule by mouth daily.     Coenzyme Q10 (CO Q-10) 120 MG CAPS Take 1 capsule by mouth daily.      Flax Oil-Fish Oil-Borage Oil (FISH OIL-FLAX OIL-BORAGE OIL PO) Take 1 each by mouth daily.     MAGNESIUM-ZINC PO Take 1 tablet by mouth daily.     metFORMIN (GLUCOPHAGE-XR) 750 MG 24 hr tablet Take 1 tablet (750 mg total) by mouth daily with breakfast. 90 tablet 1   Misc Natural Products (BLACK CHERRY CONCENTRATE PO) Take 1 each by mouth daily.      Misc Natural Products (GLUCOSAMINE CHOND COMPLEX/MSM PO) Take 1 each by mouth daily.      Multiple Vitamin (MULTI-VITAMIN DAILY PO) Take 1 each by mouth daily.      EDARBI 40 MG TABS TAKE 1 TABLET BY MOUTH  DAILY 90 tablet 0   No facility-administered medications prior to visit.    ROS Review of Systems    Constitutional: Negative for appetite change, diaphoresis, fatigue and unexpected weight change.  HENT: Negative.   Eyes: Negative for visual disturbance.  Respiratory: Negative for cough, chest tightness, shortness of breath and wheezing.   Cardiovascular: Negative for chest pain, palpitations and leg swelling.  Gastrointestinal: Negative for abdominal pain, constipation, diarrhea, nausea and vomiting.  Endocrine: Negative for polydipsia, polyphagia and polyuria.  Genitourinary: Negative.  Negative for difficulty urinating and dysuria.  Musculoskeletal: Negative for arthralgias and myalgias.  Skin: Negative.  Negative for color change and pallor.  Neurological: Negative.  Negative for dizziness, weakness, light-headedness, numbness and headaches.  Hematological: Negative for adenopathy. Does not bruise/bleed easily.  Psychiatric/Behavioral: Negative.     Objective:  BP 132/70 (BP Location: Left Arm)    Pulse 80    Temp 98.5 F (36.9 C) (Oral)    Wt 188 lb 6.4 oz (85.5 kg)    SpO2 97%    BMI 28.02 kg/m   BP Readings from Last 3 Encounters:  09/25/19 132/70  02/07/19 (!) 146/82  08/11/18 130/80    Wt Readings from Last 3 Encounters:  09/25/19 188 lb 6.4 oz (85.5 kg)  02/07/19 187 lb (84.8 kg)  08/11/18 188 lb (85.3 kg)    Physical Exam Vitals reviewed.  Constitutional:      Appearance: Normal appearance.  HENT:  Nose: Nose normal.     Mouth/Throat:     Mouth: Mucous membranes are moist.  Eyes:     General: No scleral icterus.    Conjunctiva/sclera: Conjunctivae normal.  Cardiovascular:     Rate and Rhythm: Normal rate and regular rhythm.     Heart sounds: No murmur heard.   Pulmonary:     Effort: Pulmonary effort is normal.     Breath sounds: No stridor. No wheezing, rhonchi or rales.  Abdominal:     General: Abdomen is protuberant. Bowel sounds are normal.     Palpations: Abdomen is soft. There is no hepatomegaly, splenomegaly or mass.     Tenderness: There  is no abdominal tenderness.  Musculoskeletal:        General: Normal range of motion.     Cervical back: Neck supple.     Right lower leg: No edema.     Left lower leg: No edema.  Lymphadenopathy:     Cervical: No cervical adenopathy.  Skin:    General: Skin is warm and dry.  Neurological:     General: No focal deficit present.     Mental Status: He is alert.  Psychiatric:        Mood and Affect: Mood normal.        Behavior: Behavior normal.     Lab Results  Component Value Date   WBC 6.0 02/07/2019   HGB 14.6 02/07/2019   HCT 42.9 02/07/2019   PLT 180.0 02/07/2019   GLUCOSE 98 09/25/2019   CHOL 118 02/07/2019   TRIG 134.0 02/07/2019   HDL 38.10 (L) 02/07/2019   LDLDIRECT 87.0 02/03/2016   LDLCALC 54 02/07/2019   ALT 19 02/07/2019   AST 20 02/07/2019   NA 138 09/25/2019   K 4.6 09/25/2019   CL 104 09/25/2019   CREATININE 1.09 09/25/2019   BUN 23 09/25/2019   CO2 29 09/25/2019   TSH 1.21 02/07/2019   PSA 0.57 02/07/2019   HGBA1C 6.2 (H) 09/25/2019   MICROALBUR 2.0 (H) 02/07/2019    MR SHOULDER LEFT W CONTRAST  Result Date: 05/19/2017 CLINICAL DATA:  Left shoulder pain and limited range of motion for 8 months. Painful range of motion. EXAM: MR ARTHROGRAM OF THE LEFT SHOULDER TECHNIQUE: Multiplanar, multisequence MR imaging of the left shoulder was performed following the administration of intra-articular contrast. CONTRAST:  See Injection Documentation. COMPARISON:  None. FINDINGS: Rotator cuff: Moderate tendinosis of the supraspinatus tendon with a small partial-thickness articular-surface tear. Mild tendinosis of the infraspinatus tendon. Teres minor tendon is intact. Moderate tendinosis of the subscapularis tendon with an insertional interstitial tear. Muscles: No atrophy or fatty replacement of nor abnormal signal within, the muscles of the rotator cuff. Biceps long head: Severe tendinosis of the intraarticular portion of the long head of the biceps tendon. Moderate  tendinosis of the proximal extra-articular portion of the long head of the biceps tendon. Acromioclavicular Joint: Severe arthropathy of the acromioclavicular joint with hypertrophic changes along the undersurface deforming the anterior supraspinatus musculotendinous junction. Type I acromion. No significant subacromial/subdeltoid bursal fluid. Glenohumeral Joint: Intraarticular contrast distending the joint capsule. Normal glenohumeral ligaments. Partial-thickness cartilage loss of the glenohumeral joint with areas of high-grade partial-thickness cartilage loss along the superior humeral head. Labrum: Mild diffuse labral degeneration.  No discrete labral tear. Bones: No acute osseous abnormality.  No aggressive osseous lesion. IMPRESSION: 1. Moderate tendinosis of the supraspinatus tendon with a small partial-thickness articular-surface tear. 2. Mild tendinosis of the infraspinatus tendon. 3. Moderate tendinosis  of the subscapularis tendon with an insertional interstitial tear. 4. Severe tendinosis of the intraarticular portion of the long head of the biceps tendon. Moderate tendinosis of the proximal extra-articular portion of the long head of the biceps tendon. 5. 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 6. Severe arthropathy of the acromioclavicular joint with hypertrophic changes along the undersurface deforming the anterior supraspinatus musculotendinous junction. Electronically Signed   By: Kathreen Devoid   On: 05/19/2017 16:08   DG FLUORO GUIDED NEEDLE PLC ASPIRATION/INJECTION LOC  Result Date: 05/18/2017 CLINICAL DATA:  Left shoulder pain. FLUOROSCOPY TIME:  Radiation Exposure Index (as provided by the fluoroscopic device): 7.26 uGy*m2 If the device does not provide the exposure index: Fluoroscopy Time:  8 seconds Number of Acquired Images:  0 PROCEDURE: Left SHOULDER INJECTION UNDER FLUOROSCOPY An appropriate skin entrance site was determined. The site was marked, prepped with  Betadine, draped in the usual sterile fashion, and infiltrated locally with buffered Lidocaine. 22 gauge spinal needle was advanced to the superomedial margin of the humeral head under intermittent fluoroscopy. 1 ml of Lidocaine injected easily. A mixture of 0.1 ml Multihance and 20 ml of dilute Isovue 200 was then used to opacify the left shoulder capsule. No immediate complication. IMPRESSION: Technically successful left shoulder injection for MRI. Electronically Signed   By: San Morelle M.D.   On: 05/18/2017 16:11    Assessment & Plan:   Taylor Torres was seen today for diabetes.  Diagnoses and all orders for this visit:  Type 2 diabetes mellitus with complication, without long-term current use of insulin ([place])- His A1c is at 6.2%.  His blood sugars are adequately well controlled.  Will continue the current dose of Metformin. -     BASIC METABOLIC PANEL WITH GFR; Future -     Hemoglobin A1c; Future -     Azilsartan Medoxomil (EDARBI) 40 MG TABS; Take 1 tablet by mouth daily. -     Hemoglobin A1c -     BASIC METABOLIC PANEL WITH GFR  Essential hypertension- His blood pressure is well controlled.  Electrolytes and renal function are normal. -     BASIC METABOLIC PANEL WITH GFR; Future -     Azilsartan Medoxomil (EDARBI) 40 MG TABS; Take 1 tablet by mouth daily. -     BASIC METABOLIC PANEL WITH GFR   I have changed Taylor Torres. Taylor Torres's Edarbi. I am also having him maintain his B Complex-Biotin-FA (B-COMPLEX PO), MAGNESIUM-ZINC PO, Vitamin D3, CINNAMON PO, Co Q-10, Misc Natural Products (BLACK CHERRY CONCENTRATE PO), Misc Natural Products (GLUCOSAMINE CHOND COMPLEX/MSM PO), Multiple Vitamin (MULTI-VITAMIN DAILY PO), Flax Oil-Fish Oil-Borage Oil (FISH OIL-FLAX OIL-BORAGE OIL PO), Accu-Chek Guide Me, metFORMIN, and atorvastatin.  Meds ordered this encounter  Medications   Azilsartan Medoxomil (EDARBI) 40 MG TABS    Sig: Take 1 tablet by mouth daily.    Dispense:  90 tablet    Refill:  0      Follow-up: Return in about 6 months (around 03/27/2020).  Scarlette Calico, MD

## 2019-09-26 LAB — BASIC METABOLIC PANEL WITH GFR
BUN: 23 mg/dL (ref 7–25)
CO2: 29 mmol/L (ref 20–32)
Calcium: 10 mg/dL (ref 8.6–10.3)
Chloride: 104 mmol/L (ref 98–110)
Creat: 1.09 mg/dL (ref 0.70–1.18)
GFR, Est African American: 77 mL/min/{1.73_m2} (ref >=60)
GFR, Est Non African American: 67 mL/min/{1.73_m2} (ref >=60)
Glucose, Bld: 98 mg/dL (ref 65–99)
Potassium: 4.6 mmol/L (ref 3.5–5.3)
Sodium: 138 mmol/L (ref 135–146)

## 2019-09-26 LAB — HEMOGLOBIN A1C
Hgb A1c MFr Bld: 6.2 % of total Hgb — ABNORMAL HIGH (ref <5.7)
Mean Plasma Glucose: 131 (calc)
eAG (mmol/L): 7.3 (calc)

## 2019-09-27 ENCOUNTER — Other Ambulatory Visit: Payer: Self-pay | Admitting: Internal Medicine

## 2019-09-27 DIAGNOSIS — D1724 Benign lipomatous neoplasm of skin and subcutaneous tissue of left leg: Secondary | ICD-10-CM | POA: Insufficient documentation

## 2019-11-05 ENCOUNTER — Other Ambulatory Visit: Payer: Self-pay | Admitting: Internal Medicine

## 2019-11-05 DIAGNOSIS — E785 Hyperlipidemia, unspecified: Secondary | ICD-10-CM

## 2019-11-05 DIAGNOSIS — E118 Type 2 diabetes mellitus with unspecified complications: Secondary | ICD-10-CM

## 2019-11-19 ENCOUNTER — Other Ambulatory Visit: Payer: Self-pay | Admitting: Internal Medicine

## 2019-11-19 DIAGNOSIS — E118 Type 2 diabetes mellitus with unspecified complications: Secondary | ICD-10-CM

## 2019-11-19 DIAGNOSIS — I10 Essential (primary) hypertension: Secondary | ICD-10-CM

## 2019-12-06 ENCOUNTER — Institutional Professional Consult (permissible substitution): Payer: Medicare Other | Admitting: Plastic Surgery

## 2019-12-13 ENCOUNTER — Other Ambulatory Visit: Payer: Self-pay

## 2019-12-13 ENCOUNTER — Ambulatory Visit: Payer: Medicare Other | Admitting: Plastic Surgery

## 2019-12-13 ENCOUNTER — Encounter: Payer: Self-pay | Admitting: Plastic Surgery

## 2019-12-13 VITALS — BP 98/57 | HR 87 | Temp 98.6°F | Ht 69.0 in | Wt 187.8 lb

## 2019-12-13 DIAGNOSIS — D1724 Benign lipomatous neoplasm of skin and subcutaneous tissue of left leg: Secondary | ICD-10-CM | POA: Diagnosis not present

## 2019-12-13 NOTE — Progress Notes (Signed)
Referring Provider Janith Lima, MD [id number] [place],  [place] [id number]   CC:  Chief Complaint  Patient presents with  . Advice Only      Taylor Torres is an 74 y.o. male.  HPI: Patient presents with a painful lesion on his posterior thigh.  Its been growing in size and has been present for over a year.  It particularly bothers him when he sitting on a hard surface such as a bench or toilet seat as it is digging into his skin when compressed in that way.  He has had another lipoma removed in the past under his shoulder.  He would like to have this excised.  No Known Allergies  Outpatient Encounter Medications as of 12/13/2019  Medication Sig  . atorvastatin (LIPITOR) 40 MG tablet TAKE 1 TABLET BY MOUTH  DAILY  . B Complex-Biotin-FA (B-COMPLEX PO) Take 1 tablet by mouth daily.    . Blood Glucose Monitoring Suppl (ACCU-CHEK GUIDE ME) w/Device KIT 1 Act by Does not apply route 2 (two) times a day.  . Cholecalciferol (VITAMIN D3) 1000 units CAPS Take 2,000 Units by mouth.  Marland Kitchen CINNAMON PO Take 1 capsule by mouth daily.  . Coenzyme Q10 (CO Q-10) 120 MG CAPS Take 1 capsule by mouth daily.   Marland Kitchen EDARBI 40 MG TABS TAKE 1 TABLET BY MOUTH  DAILY  . Flax Oil-Fish Oil-Borage Oil (FISH OIL-FLAX OIL-BORAGE OIL PO) Take 1 each by mouth daily.  Marland Kitchen MAGNESIUM-ZINC PO Take 1 tablet by mouth daily.  . metFORMIN (GLUCOPHAGE-XR) 750 MG 24 hr tablet Take 1 tablet (750 mg total) by mouth daily with breakfast.  . Misc Natural Products (BLACK CHERRY CONCENTRATE PO) Take 1 each by mouth daily.   . Misc Natural Products (GLUCOSAMINE CHOND COMPLEX/MSM PO) Take 1 each by mouth daily.   . Multiple Vitamin (MULTI-VITAMIN DAILY PO) Take 1 each by mouth daily.   . [DISCONTINUED] atorvastatin (LIPITOR) 40 MG tablet TAKE 1 TABLET BY MOUTH  DAILY  . [DISCONTINUED] Azilsartan Medoxomil (EDARBI) 40 MG TABS Take 1 tablet by mouth daily.   No facility-administered encounter medications on file as of 12/13/2019.      Past Medical History:  Diagnosis Date  . Alcoholism in family   . BPH (benign prostatic hypertrophy)   . Diabetes mellitus without complication ([place])   . Diverticulosis   . Family history of diabetes mellitus   . History of colonic polyps   . Hyperlipidemia     Past Surgical History:  Procedure Laterality Date  . COLONOSCOPY W/ POLYPECTOMY    . none      Family History  Problem Relation Age of Onset  . Alcohol abuse Other   . Diabetes Other   . Esophageal cancer Brother        Dead  . Esophageal cancer Brother        alive  . Colon polyps Father   . Heart disease Neg Hx   . Hyperlipidemia Neg Hx   . Hypertension Neg Hx   . Kidney disease Neg Hx   . Stroke Neg Hx   . Early death Neg Hx   . Colon cancer Neg Hx   . Gallbladder disease Neg Hx     Social History   Social History Narrative  . Not on file     Review of Systems General: Denies fevers, chills, weight loss CV: Denies chest pain, shortness of breath, palpitations  Physical Exam Vitals with BMI 12/13/2019 09/25/2019 02/07/2019  Height '5\' 9"'  - 5' 8.75"  Weight 187 lbs 13 oz 188 lbs 6 oz 187 lbs  BMI 95.70 - 22.02  Systolic 98 669 167  Diastolic 57 70 82  Pulse 87 80 67    General:  No acute distress,  Alert and oriented, Non-Toxic, Normal speech and affect Examination shows a 4 cm subcutaneous mass in the posterior aspect of the left upper thigh.  There is no overlying skin changes.  It feels freely mobile.  Assessment/Plan Patient has what appears to be a subcutaneous lipoma on the posterior aspect of the left thigh.  We discussed excision in the office versus operating room.  He prefers the office and I think that is totally reasonable.  We discussed the risks include bleeding, infection, damage to surrounding structures and need for additional procedures.  I explained the expectations postoperatively.  All of his questions were answered and we will plan to set this up for him.  Cindra Presume 12/13/2019, 11:07 AM

## 2019-12-26 ENCOUNTER — Encounter: Payer: Self-pay | Admitting: Internal Medicine

## 2020-01-17 ENCOUNTER — Other Ambulatory Visit (HOSPITAL_COMMUNITY)
Admission: RE | Admit: 2020-01-17 | Discharge: 2020-01-17 | Disposition: A | Discharge status: Home / Self Care | Payer: Medicare Other | Source: Ambulatory Visit | Attending: Plastic Surgery | Admitting: Plastic Surgery

## 2020-01-17 ENCOUNTER — Ambulatory Visit: Payer: Medicare Other | Admitting: Plastic Surgery

## 2020-01-17 ENCOUNTER — Other Ambulatory Visit: Payer: Self-pay

## 2020-01-17 DIAGNOSIS — D1724 Benign lipomatous neoplasm of skin and subcutaneous tissue of left leg: Secondary | ICD-10-CM

## 2020-01-17 DIAGNOSIS — L720 Epidermal cyst: Secondary | ICD-10-CM

## 2020-01-17 MED ORDER — HYDROCODONE-ACETAMINOPHEN 5-325 MG PO TABS: 1.0000 | ORAL_TABLET | Freq: Four times a day (QID) | ORAL | 0 refills | Status: DC | PRN

## 2020-01-17 NOTE — Addendum Note (Signed)
Addended by: Cindra Presume on: 01/17/2020 03:55 PM   Modules accepted: Orders

## 2020-01-17 NOTE — Progress Notes (Signed)
Operative Note   DATE OF OPERATION: 01/17/2020  LOCATION:    SURGICAL DEPARTMENT: Plastic Surgery  PREOPERATIVE DIAGNOSES:  Left thigh mass  POSTOPERATIVE DIAGNOSES:  same  PROCEDURE:  1. Excision of left thigh cyst measuring 4 cm 2. Complex closure measuring 4 cm  SURGEON: Talmadge Coventry, MD  ANESTHESIA:  Local  COMPLICATIONS: None.   INDICATIONS FOR PROCEDURE:  The patient, Taylor Torres is a 74 y.o. male born on [birth year], is here for treatment of left thigh mass. MRN: [id number]  CONSENT:  Informed consent was obtained directly from the patient. Risks, benefits and alternatives were fully discussed. Specific risks including but not limited to bleeding, infection, hematoma, seroma, scarring, pain, infection, wound healing problems, and need for further surgery were all discussed. The patient did have an ample opportunity to have questions answered to satisfaction.   DESCRIPTION OF PROCEDURE:  Local anesthesia was administered. The patient's operative site was prepped and draped in a sterile fashion. A time out was performed and all information was confirmed to be correct.  The lesion was excised with a 15 blade.  Hemostasis was obtained.  Circumferential undermining was performed and the skin was advanced and closed in layers with interrupted buried Monocryl sutures and 3-0 monocryl for the skin.  The lesion excised measured 4 cm, and the total length of closure measured 4 cm.    The patient tolerated the procedure well.  There were no complications.

## 2020-01-22 LAB — SURGICAL PATHOLOGY

## 2020-01-30 NOTE — Progress Notes (Signed)
Patient is a 74 year old male here for follow-up after undergoing excision of left thigh cyst on 01/17/2020 with Dr. Claudia Desanctis.  Pathology results (reviewed with patient): Benign epidermal cyst with focal giant cell reaction consistent with rupture.  No evidence of malignancy.  ~ 2 weeks PO Patient reports he is doing well overall.  Denies fever/chills, nausea/vomiting.  Reports the incision site feels a little irritated but no real pain.  Incision is healing well, C/D/I.  No signs of infection, drainage, seroma/hematoma.  Irritation erythema present as well as a small scab superior to the incision.  Sutures removed today.  He may apply Vaseline for moisture to the area around the incision.  Follow-up as needed.  Return precautions provided.  Call office with any questions/concerns.   Pictures were obtained of the patient and placed in the chart with the patient's or guardian's permission.

## 2020-02-01 ENCOUNTER — Ambulatory Visit: Payer: Medicare Other | Admitting: Plastic Surgery

## 2020-02-01 ENCOUNTER — Encounter: Payer: Self-pay | Admitting: Plastic Surgery

## 2020-02-01 ENCOUNTER — Other Ambulatory Visit: Payer: Self-pay

## 2020-02-01 VITALS — BP 104/57 | HR 85 | Temp 98.0°F

## 2020-02-01 DIAGNOSIS — L720 Epidermal cyst: Secondary | ICD-10-CM

## 2020-02-08 ENCOUNTER — Encounter: Payer: Self-pay | Admitting: Internal Medicine

## 2020-02-08 ENCOUNTER — Ambulatory Visit (INDEPENDENT_AMBULATORY_CARE_PROVIDER_SITE_OTHER): Payer: Medicare Other | Admitting: Internal Medicine

## 2020-02-08 ENCOUNTER — Other Ambulatory Visit: Payer: Self-pay

## 2020-02-08 VITALS — BP 122/72 | HR 64 | Temp 97.7°F | Ht 69.0 in | Wt 188.0 lb

## 2020-02-08 DIAGNOSIS — E118 Type 2 diabetes mellitus with unspecified complications: Secondary | ICD-10-CM

## 2020-02-08 DIAGNOSIS — Z0000 Encounter for general adult medical examination without abnormal findings: Secondary | ICD-10-CM | POA: Diagnosis not present

## 2020-02-08 DIAGNOSIS — E785 Hyperlipidemia, unspecified: Secondary | ICD-10-CM | POA: Diagnosis not present

## 2020-02-08 DIAGNOSIS — I10 Essential (primary) hypertension: Secondary | ICD-10-CM | POA: Diagnosis not present

## 2020-02-08 DIAGNOSIS — E781 Pure hyperglyceridemia: Secondary | ICD-10-CM | POA: Diagnosis not present

## 2020-02-08 DIAGNOSIS — N400 Enlarged prostate without lower urinary tract symptoms: Secondary | ICD-10-CM | POA: Diagnosis not present

## 2020-02-08 DIAGNOSIS — Z0001 Encounter for general adult medical examination with abnormal findings: Secondary | ICD-10-CM | POA: Insufficient documentation

## 2020-02-08 LAB — BASIC METABOLIC PANEL
BUN: 24 mg/dL — ABNORMAL HIGH (ref 6–23)
CO2: 29 mEq/L (ref 19–32)
Calcium: 10 mg/dL (ref 8.4–10.5)
Chloride: 102 mEq/L (ref 96–112)
Creatinine, Ser: 1.08 mg/dL (ref 0.40–1.50)
GFR: 67.61 mL/min (ref >60.00)
Glucose, Bld: 91 mg/dL (ref 70–99)
Potassium: 4.1 mEq/L (ref 3.5–5.1)
Sodium: 136 mEq/L (ref 135–145)

## 2020-02-08 LAB — LIPID PANEL
Cholesterol: 116 mg/dL (ref 0–200)
HDL: 38 mg/dL — ABNORMAL LOW (ref >39.00)
LDL Cholesterol: 42 mg/dL (ref 0–99)
NonHDL: 77.66
Total CHOL/HDL Ratio: 3
Triglycerides: 176 mg/dL — ABNORMAL HIGH (ref 0.0–149.0)
VLDL: 35.2 mg/dL (ref 0.0–40.0)

## 2020-02-08 LAB — HEPATIC FUNCTION PANEL
ALT: 15 U/L (ref 0–53)
AST: 15 U/L (ref 0–37)
Albumin: 4.7 g/dL (ref 3.5–5.2)
Alkaline Phosphatase: 45 U/L (ref 39–117)
Bilirubin, Direct: 0.1 mg/dL (ref 0.0–0.3)
Total Bilirubin: 0.5 mg/dL (ref 0.2–1.2)
Total Protein: 7.9 g/dL (ref 6.0–8.3)

## 2020-02-08 LAB — CBC WITH DIFFERENTIAL/PLATELET
Basophils Absolute: 0 10*3/uL (ref 0.0–0.1)
Basophils Relative: 0.4 % (ref 0.0–3.0)
Eosinophils Absolute: 0.2 10*3/uL (ref 0.0–0.7)
Eosinophils Relative: 3.3 % (ref 0.0–5.0)
HCT: 39.4 % (ref 39.0–52.0)
Hemoglobin: 13.3 g/dL (ref 13.0–17.0)
Lymphocytes Relative: 25.8 % (ref 12.0–46.0)
Lymphs Abs: 1.9 10*3/uL (ref 0.7–4.0)
MCHC: 33.8 g/dL (ref 30.0–36.0)
MCV: 92.8 fl (ref 78.0–100.0)
Monocytes Absolute: 0.6 10*3/uL (ref 0.1–1.0)
Monocytes Relative: 7.6 % (ref 3.0–12.0)
Neutro Abs: 4.6 10*3/uL (ref 1.4–7.7)
Neutrophils Relative %: 62.9 % (ref 43.0–77.0)
Platelets: 197 10*3/uL (ref 150.0–400.0)
RBC: 4.24 Mil/uL (ref 4.22–5.81)
RDW: 12.3 % (ref 11.5–15.5)
WBC: 7.3 10*3/uL (ref 4.0–10.5)

## 2020-02-08 LAB — URINALYSIS, ROUTINE W REFLEX MICROSCOPIC
Bilirubin Urine: NEGATIVE
Hgb urine dipstick: NEGATIVE
Ketones, ur: NEGATIVE
Leukocytes,Ua: NEGATIVE
Nitrite: NEGATIVE
RBC / HPF: NONE SEEN (ref 0–?)
Specific Gravity, Urine: >=1.03 — AB (ref 1.000–1.030)
Total Protein, Urine: NEGATIVE
Urine Glucose: NEGATIVE
Urobilinogen, UA: 0.2 (ref 0.0–1.0)
pH: 5.5 (ref 5.0–8.0)

## 2020-02-08 LAB — PSA: PSA: 0.64 ng/mL (ref 0.10–4.00)

## 2020-02-08 LAB — MICROALBUMIN / CREATININE URINE RATIO
Creatinine,U: 114 mg/dL
Microalb Creat Ratio: 0.8 mg/g (ref 0.0–30.0)
Microalb, Ur: 1 mg/dL (ref 0.0–1.9)

## 2020-02-08 LAB — HEMOGLOBIN A1C: Hgb A1c MFr Bld: 6.6 % — ABNORMAL HIGH (ref 4.6–6.5)

## 2020-02-08 LAB — TSH: TSH: 1.53 u[IU]/mL (ref 0.35–4.50)

## 2020-02-08 MED ORDER — METFORMIN HCL ER 750 MG PO TB24: 750.0000 mg | ORAL_TABLET | Freq: Every day | ORAL | 1 refills | Status: DC

## 2020-02-08 MED ORDER — ATORVASTATIN CALCIUM 40 MG PO TABS: 40.0000 mg | ORAL_TABLET | Freq: Every day | ORAL | 1 refills | Status: DC

## 2020-02-08 NOTE — Progress Notes (Signed)
Subjective:  Patient ID: Taylor Torres, male    DOB: [birth year]  Age: 74 y.o. MRN: [id number]  CC: Annual Exam, Hypertension, Diabetes, and Hyperlipidemia  This visit occurred during the SARS-CoV-2 public health emergency.  Safety protocols were in place, including screening questions prior to the visit, additional usage of staff PPE, and extensive cleaning of exam room while observing appropriate contact time as indicated for disinfecting solutions.    HPI Taylor Torres presents for a CPX.  He monitors his blood pressure closely and his systolic blood pressure is always less than 120 and sometimes down to 110.  He denies episodes of dizziness, lightheadedness, or near syncope.  He is very active and denies chest pain, shortness of breath, palpitations, edema, or fatigue.  His blood sugars have been well controlled and he denies polys.  Outpatient Medications Prior to Visit  Medication Sig Dispense Refill  . B Complex-Biotin-FA (B-COMPLEX PO) Take 1 tablet by mouth daily.    . Blood Glucose Monitoring Suppl (ACCU-CHEK GUIDE ME) w/Device KIT 1 Act by Does not apply route 2 (two) times a day. 2 kit 0  . Cholecalciferol (VITAMIN D3) 1000 units CAPS Take 2,000 Units by mouth.    Marland Kitchen CINNAMON PO Take 1 capsule by mouth daily.    . Coenzyme Q10 (CO Q-10) 120 MG CAPS Take 1 capsule by mouth daily.     . Flax Oil-Fish Oil-Borage Oil (FISH OIL-FLAX OIL-BORAGE OIL PO) Take 1 each by mouth daily.    Marland Kitchen MAGNESIUM-ZINC PO Take 1 tablet by mouth daily.    . Misc Natural Products (BLACK CHERRY CONCENTRATE PO) Take 1 each by mouth daily.     . Misc Natural Products (GLUCOSAMINE CHOND COMPLEX/MSM PO) Take 1 each by mouth daily.     . Multiple Vitamin (MULTI-VITAMIN DAILY PO) Take 1 each by mouth daily.     . Turmeric (QC TUMERIC COMPLEX) 500 MG CAPS Take by mouth.    Marland Kitchen atorvastatin (LIPITOR) 40 MG tablet TAKE 1 TABLET BY MOUTH  DAILY 90 tablet 1  . EDARBI 40 MG TABS TAKE 1 TABLET BY MOUTH  DAILY 90 tablet 1   . metFORMIN (GLUCOPHAGE-XR) 750 MG 24 hr tablet Take 1 tablet (750 mg total) by mouth daily with breakfast. 90 tablet 1  . HYDROcodone-acetaminophen (NORCO) 5-325 MG tablet Take 1 tablet by mouth every 6 (six) hours as needed for moderate pain. 10 tablet 0   No facility-administered medications prior to visit.    ROS Review of Systems  Constitutional: Negative.  Negative for diaphoresis, fatigue and unexpected weight change.  HENT: Negative.   Eyes: Negative.   Respiratory: Negative for cough, chest tightness, shortness of breath and wheezing.   Cardiovascular: Negative for chest pain, palpitations and leg swelling.  Gastrointestinal: Negative for abdominal pain, constipation, diarrhea, nausea and vomiting.  Endocrine: Negative.  Negative for polydipsia, polyphagia and polyuria.  Genitourinary: Negative.  Negative for difficulty urinating, dysuria, testicular pain and urgency.  Musculoskeletal: Negative for arthralgias, myalgias and neck pain.  Skin: Negative for color change and pallor.  Neurological: Negative.  Negative for dizziness, syncope, weakness and light-headedness.  Hematological: Negative for adenopathy. Does not bruise/bleed easily.  Psychiatric/Behavioral: Negative.     Objective:  BP 122/72   Pulse 64   Temp 97.7 F (36.5 C) (Oral)   Ht _0  (1.753 m)   Wt 188 lb (85.3 kg)   SpO2 99%   BMI 27.76 kg/m   BP Readings from Last 3 Encounters:  02/08/20 122/72  02/01/20 (!) 104/57  12/13/19 (!) 98/57    Wt Readings from Last 3 Encounters:  02/08/20 188 lb (85.3 kg)  12/13/19 187 lb 12.8 oz (85.2 kg)  09/25/19 188 lb 6.4 oz (85.5 kg)    Physical Exam Vitals reviewed.  HENT:     Mouth/Throat:     Mouth: Mucous membranes are moist.  Eyes:     General: No scleral icterus.    Conjunctiva/sclera: Conjunctivae normal.  Cardiovascular:     Rate and Rhythm: Normal rate and regular rhythm.     Heart sounds: No murmur heard.   Pulmonary:     Effort:  Pulmonary effort is normal.     Breath sounds: No stridor. No wheezing, rhonchi or rales.  Abdominal:     General: Abdomen is flat.     Palpations: There is no hepatomegaly, splenomegaly or mass.     Tenderness: There is no abdominal tenderness. There is no guarding.  Genitourinary:    Pubic Area: No rash.      Penis: Normal and circumcised. No lesions.      Testes: Normal.     Epididymis:     Right: Normal.     Left: Normal.     Prostate: Enlarged. Not tender and no nodules present.     Rectum: Normal. Guaiac result negative. No mass, tenderness, anal fissure, external hemorrhoid or internal hemorrhoid. Normal anal tone.  Musculoskeletal:        General: Normal range of motion.     Cervical back: Neck supple.     Right lower leg: No edema.     Left lower leg: No edema.  Lymphadenopathy:     Cervical: No cervical adenopathy.  Skin:    General: Skin is warm and dry.     Coloration: Skin is not pale.  Neurological:     General: No focal deficit present.     Mental Status: He is alert.  Psychiatric:        Mood and Affect: Mood normal.     Lab Results  Component Value Date   WBC 7.3 02/08/2020   HGB 13.3 02/08/2020   HCT 39.4 02/08/2020   PLT 197.0 02/08/2020   GLUCOSE 91 02/08/2020   CHOL 116 02/08/2020   TRIG 176.0 (H) 02/08/2020   HDL 38.00 (L) 02/08/2020   LDLDIRECT 87.0 02/03/2016   LDLCALC 42 02/08/2020   ALT 15 02/08/2020   AST 15 02/08/2020   NA 136 02/08/2020   K 4.1 02/08/2020   CL 102 02/08/2020   CREATININE 1.08 02/08/2020   BUN 24 (H) 02/08/2020   CO2 29 02/08/2020   TSH 1.53 02/08/2020   PSA 0.64 02/08/2020   HGBA1C 6.6 (H) 02/08/2020   MICROALBUR 1.0 02/08/2020    No results found.  Assessment & Plan:   Taylor Torres was seen today for annual exam, hypertension, diabetes and hyperlipidemia.  Diagnoses and all orders for this visit:  Essential hypertension- His blood pressure is somewhat overcontrolled and he has a mild prerenal azotemia.  I  recommended that he discontinue the ARB for now. -     CBC with Differential/Platelet; Future -     Basic metabolic panel; Future -     TSH; Future -     TSH -     Basic metabolic panel -     CBC with Differential/Platelet  Type 2 diabetes mellitus with complication, without long-term current use of insulin ([place])- His A1c is at 6.6%.  His blood sugars are  adequately well controlled.  We will continue the current dose of Metformin. -     Basic metabolic panel; Future -     Microalbumin / creatinine urine ratio; Future -     Hemoglobin A1c; Future -     HM Diabetes Foot Exam -     Hemoglobin A1c -     Microalbumin / creatinine urine ratio -     Basic metabolic panel -     atorvastatin (LIPITOR) 40 MG tablet; Take 1 tablet (40 mg total) by mouth daily. -     metFORMIN (GLUCOPHAGE-XR) 750 MG 24 hr tablet; Take 1 tablet (750 mg total) by mouth daily with breakfast.  Pure hypertriglyceridemia- His triglycerides are mildly elevated.  He will continue to work on his lifestyle modifications. -     Lipid panel; Future -     Lipid panel  Encounter for general adult medical examination with abnormal findings- Exam completed, labs reviewed, vaccines reviewed and updated, cancer screenings are up-to-date, patient education was given.  Hyperlipidemia with target LDL less than 130- He has achieved his LDL goal and is doing well on the statin. -     Lipid panel; Future -     Hepatic function panel; Future -     TSH; Future -     TSH -     Hepatic function panel -     Lipid panel -     atorvastatin (LIPITOR) 40 MG tablet; Take 1 tablet (40 mg total) by mouth daily.  BPH without obstruction/lower urinary tract symptoms- His PSA is low which is a reassuring sign that he does not have prostate cancer.  He has no symptoms that need to be treated. -     PSA; Future -     Urinalysis, Routine w reflex microscopic; Future -     Urinalysis, Routine w reflex microscopic -     PSA   I have discontinued  [name]. K[place]ore's Edarbi and HYDROcodone-acetaminophen. I have also changed his atorvastatin. Additionally, I am having him maintain his B Complex-Biotin-FA (B-COMPLEX PO), MAGNESIUM-ZINC PO, Vitamin D3, CINNAMON PO, Co Q-10, Misc Natural Products (BLACK CHERRY CONCENTRATE PO), Misc Natural Products (GLUCOSAMINE CHOND COMPLEX/MSM PO), Multiple Vitamin (MULTI-VITAMIN DAILY PO), Flax Oil-Fish Oil-Borage Oil (FISH OIL-FLAX OIL-BORAGE OIL PO), Accu-Chek Guide Me, Turmeric, and metFORMIN.  Meds ordered this encounter  Medications  . atorvastatin (LIPITOR) 40 MG tablet    Sig: Take 1 tablet (40 mg total) by mouth daily.    Dispense:  90 tablet    Refill:  1  . metFORMIN (GLUCOPHAGE-XR) 750 MG 24 hr tablet    Sig: Take 1 tablet (750 mg total) by mouth daily with breakfast.    Dispense:  90 tablet    Refill:  1   In addition to time spent on CPE, I spent 50 minutes in preparing to see the patient by review of recent labs, imaging and procedures, obtaining and reviewing separately obtained history, communicating with the patient and family or caregiver, ordering medications, tests or procedures, and documenting clinical information in the EHR including the differential Dx, treatment, and any further evaluation and other management of 1. Essential hypertension 2. Type 2 diabetes mellitus with complication, without long-term current use of insulin (HCC) 3. Pure hypertriglyceridemia 4. Hyperlipidemia with target LDL less than 130 5. BPH without obstruction/lower urinary tract symptoms     Follow-up: Return in about 6 months (around 08/08/2020).  Scarlette Calico, MD

## 2020-02-08 NOTE — Patient Instructions (Signed)

## 2020-04-08 ENCOUNTER — Ambulatory Visit (INDEPENDENT_AMBULATORY_CARE_PROVIDER_SITE_OTHER): Payer: Medicare Other

## 2020-04-08 DIAGNOSIS — Z0000 Encounter for general adult medical examination without abnormal findings: Secondary | ICD-10-CM

## 2020-04-08 NOTE — Patient Instructions (Signed)
Taylor Torres , Thank you for taking time to come for your Medicare Wellness Visit. I appreciate your ongoing commitment to your health goals. Please review the following plan we discussed and let me know if I can assist you in the future.   Screening recommendations/referrals: Colonoscopy: 07/04/2015; due every 10 years Recommended yearly ophthalmology/optometry visit for glaucoma screening and checkup Recommended yearly dental visit for hygiene and checkup  Vaccinations: Influenza vaccine: 11/10/2019 Pneumococcal vaccine: up to date Tdap vaccine: 01/13/2012; due every 10 years Shingles vaccine: never done   Covid-19: up to date  Advanced directives: Please bring a copy of your health care power of attorney and living will to the office at your convenience.  Conditions/risks identified: Yes; Reviewed health maintenance screenings with patient today and relevant education, vaccines, and/or referrals were provided. Please continue to do your personal lifestyle choices by: daily care of teeth and gums, regular physical activity (goal should be 5 days a week for 30 minutes), eat a healthy diet, avoid tobacco and drug use, limiting any alcohol intake, taking a low-dose aspirin (if not allergic or have been advised by your provider otherwise) and taking vitamins and minerals as recommended by your provider. Continue doing brain stimulating activities (puzzles, reading, adult coloring books, staying active) to keep memory sharp. Continue to eat heart healthy diet (full of fruits, vegetables, whole grains, lean protein, water--limit salt, fat, and sugar intake) and increase physical activity as tolerated.  Next appointment: Please schedule your next Medicare Wellness Visit with your Nurse Health Advisor in 1 year by calling [phone].  Preventive Care 30 Years and Older, Male Preventive care refers to lifestyle choices and visits with your health care provider that can promote health and wellness. What  does preventive care include?  A yearly physical exam. This is also called an annual well check.  Dental exams once or twice a year.  Routine eye exams. Ask your health care provider how often you should have your eyes checked.  Personal lifestyle choices, including:  Daily care of your teeth and gums.  Regular physical activity.  Eating a healthy diet.  Avoiding tobacco and drug use.  Limiting alcohol use.  Practicing safe sex.  Taking low doses of aspirin every day.  Taking vitamin and mineral supplements as recommended by your health care provider. What happens during an annual well check? The services and screenings done by your health care provider during your annual well check will depend on your age, overall health, lifestyle risk factors, and family history of disease. Counseling  Your health care provider may ask you questions about your:  Alcohol use.  Tobacco use.  Drug use.  Emotional well-being.  Home and relationship well-being.  Sexual activity.  Eating habits.  History of falls.  Memory and ability to understand (cognition).  Work and work Statistician. Screening  You may have the following tests or measurements:  Height, weight, and BMI.  Blood pressure.  Lipid and cholesterol levels. These may be checked every 5 years, or more frequently if you are over 37 years old.  Skin check.  Lung cancer screening. You may have this screening every year starting at age 46 if you have a 30-pack-year history of smoking and currently smoke or have quit within the past 15 years.  Fecal occult blood test (FOBT) of the stool. You may have this test every year starting at age 20.  Flexible sigmoidoscopy or colonoscopy. You may have a sigmoidoscopy every 5 years or a colonoscopy every 10 years  starting at age 45.  Prostate cancer screening. Recommendations will vary depending on your family history and other risks.  Hepatitis C blood test.  Hepatitis  B blood test.  Sexually transmitted disease (STD) testing.  Diabetes screening. This is done by checking your blood sugar (glucose) after you have not eaten for a while (fasting). You may have this done every 1-3 years.  Abdominal aortic aneurysm (AAA) screening. You may need this if you are a current or former smoker.  Osteoporosis. You may be screened starting at age 37 if you are at high risk. Talk with your health care provider about your test results, treatment options, and if necessary, the need for more tests. Vaccines  Your health care provider may recommend certain vaccines, such as:  Influenza vaccine. This is recommended every year.  Tetanus, diphtheria, and acellular pertussis (Tdap, Td) vaccine. You may need a Td booster every 10 years.  Zoster vaccine. You may need this after age 35.  Pneumococcal 13-valent conjugate (PCV13) vaccine. One dose is recommended after age 71.  Pneumococcal polysaccharide (PPSV23) vaccine. One dose is recommended after age 41. Talk to your health care provider about which screenings and vaccines you need and how often you need them. This information is not intended to replace advice given to you by your health care provider. Make sure you discuss any questions you have with your health care provider. Document Released: 03/08/2015 Document Revised: 10/30/2015 Document Reviewed: 12/11/2014 Elsevier Interactive Patient Education  2017 [place] Prevention in the Home Falls can cause injuries. They can happen to people of all ages. There are many things you can do to make your home safe and to help prevent falls. What can I do on the outside of my home?  Regularly fix the edges of walkways and driveways and fix any cracks.  Remove anything that might make you trip as you walk through a door, such as a raised step or threshold.  Trim any bushes or trees on the path to your home.  Use bright outdoor lighting.  Clear any walking  paths of anything that might make someone trip, such as rocks or tools.  Regularly check to see if handrails are loose or broken. Make sure that both sides of any steps have handrails.  Any raised decks and porches should have guardrails on the edges.  Have any leaves, snow, or ice cleared regularly.  Use sand or salt on walking paths during winter.  Clean up any spills in your garage right away. This includes oil or grease spills. What can I do in the bathroom?  Use night lights.  Install grab bars by the toilet and in the tub and shower. Do not use towel bars as grab bars.  Use non-skid mats or decals in the tub or shower.  If you need to sit down in the shower, use a plastic, non-slip stool.  Keep the floor dry. Clean up any water that spills on the floor as soon as it happens.  Remove soap buildup in the tub or shower regularly.  Attach bath mats securely with double-sided non-slip rug tape.  Do not have throw rugs and other things on the floor that can make you trip. What can I do in the bedroom?  Use night lights.  Make sure that you have a light by your bed that is easy to reach.  Do not use any sheets or blankets that are too big for your bed. They should not hang down  onto the floor.  Have a firm chair that has side arms. You can use this for support while you get dressed.  Do not have throw rugs and other things on the floor that can make you trip. What can I do in the kitchen?  Clean up any spills right away.  Avoid walking on wet floors.  Keep items that you use a lot in easy-to-reach places.  If you need to reach something above you, use a strong step stool that has a grab bar.  Keep electrical cords out of the way.  Do not use floor polish or wax that makes floors slippery. If you must use wax, use non-skid floor wax.  Do not have throw rugs and other things on the floor that can make you trip. What can I do with my stairs?  Do not leave any items  on the stairs.  Make sure that there are handrails on both sides of the stairs and use them. Fix handrails that are broken or loose. Make sure that handrails are as long as the stairways.  Check any carpeting to make sure that it is firmly attached to the stairs. Fix any carpet that is loose or worn.  Avoid having throw rugs at the top or bottom of the stairs. If you do have throw rugs, attach them to the floor with carpet tape.  Make sure that you have a light switch at the top of the stairs and the bottom of the stairs. If you do not have them, ask someone to add them for you. What else can I do to help prevent falls?  Wear shoes that:  Do not have high heels.  Have rubber bottoms.  Are comfortable and fit you well.  Are closed at the toe. Do not wear sandals.  If you use a stepladder:  Make sure that it is fully opened. Do not climb a closed stepladder.  Make sure that both sides of the stepladder are locked into place.  Ask someone to hold it for you, if possible.  Clearly mark and make sure that you can see:  Any grab bars or handrails.  First and last steps.  Where the edge of each step is.  Use tools that help you move around (mobility aids) if they are needed. These include:  Canes.  Walkers.  Scooters.  Crutches.  Turn on the lights when you go into a dark area. Replace any light bulbs as soon as they burn out.  Set up your furniture so you have a clear path. Avoid moving your furniture around.  If any of your floors are uneven, fix them.  If there are any pets around you, be aware of where they are.  Review your medicines with your doctor. Some medicines can make you feel dizzy. This can increase your chance of falling. Ask your doctor what other things that you can do to help prevent falls. This information is not intended to replace advice given to you by your health care provider. Make sure you discuss any questions you have with your health care  provider. Document Released: 12/06/2008 Document Revised: 07/18/2015 Document Reviewed: 03/16/2014 Elsevier Interactive Patient Education  2017 [employer].

## 2020-04-08 NOTE — Progress Notes (Signed)
Subjective:   Taylor Torres is a 75 y.o. male who presents for Medicare Annual/Subsequent preventive examination.  Review of Systems    No ROS. Medicare Wellness Visit. Additional risk factors are reflected in social history. Cardiac Risk Factors include: advanced age (>57mn, >>64women);diabetes mellitus;dyslipidemia;hypertension     Objective:    There were no vitals filed for this visit. There is no height or weight on file to calculate BMI.  Advanced Directives 04/08/2020 05/04/2017 07/27/2016 07/27/2016 02/10/2016 07/02/2015 06/18/2015  Does Patient Have a Medical Advance Directive? Yes No No;Yes No Yes Yes Yes  Type of Advance Directive Living will;Healthcare Power of ASomersOut of facility DNR (pink MOST or yellow form);Living will - [place]  Does patient want to make changes to medical advance directive? No - Patient declined - No - Patient declined - - - No - Patient declined  Copy of [place] Chart? No - copy requested - No - copy requested - Yes - No - copy requested  Would patient like information on creating a medical advance directive? - - No - Patient declined No - Patient declined - - -  Pre-existing out of facility DNR order (yellow form or pink MOST form) - - Physician notified to receive inpatient order - - - -    Current Medications (verified) Outpatient Encounter Medications as of 04/08/2020  Medication Sig  . atorvastatin (LIPITOR) 40 MG tablet Take 1 tablet (40 mg total) by mouth daily.  . B Complex-Biotin-FA (B-COMPLEX PO) Take 1 tablet by mouth daily.  . Blood Glucose Monitoring Suppl (ACCU-CHEK GUIDE ME) w/Device KIT 1 Act by Does not apply route 2 (two) times a day.  . Cholecalciferol (VITAMIN D3) 1000 units CAPS Take 2,000 Units by mouth.  .Marland KitchenCINNAMON PO Take 1 capsule by mouth daily.  .  Coenzyme Q10 (CO Q-10) 120 MG CAPS Take 1 capsule by mouth daily.   . Flax Oil-Fish Oil-Borage Oil (FISH OIL-FLAX OIL-BORAGE OIL PO) Take 1 each by mouth daily.  .Marland KitchenMAGNESIUM-ZINC PO Take 1 tablet by mouth daily.  . metFORMIN (GLUCOPHAGE-XR) 750 MG 24 hr tablet Take 1 tablet (750 mg total) by mouth daily with breakfast.  . Misc Natural Products (BLACK CHERRY CONCENTRATE PO) Take 1 each by mouth daily.   . Misc Natural Products (GLUCOSAMINE CHOND COMPLEX/MSM PO) Take 1 each by mouth daily.   . Multiple Vitamin (MULTI-VITAMIN DAILY PO) Take 1 each by mouth daily.   . Turmeric 500 MG CAPS Take by mouth.   No facility-administered encounter medications on file as of 04/08/2020.    Allergies (verified) Patient has no known allergies.   History: Past Medical History:  Diagnosis Date  . Alcoholism in family   . BPH (benign prostatic hypertrophy)   . Diabetes mellitus without complication (HFirth   . Diverticulosis   . Family history of diabetes mellitus   . History of colonic polyps   . Hyperlipidemia    Past Surgical History:  Procedure Laterality Date  . COLONOSCOPY W/ POLYPECTOMY    . none     Family History  Problem Relation Age of Onset  . Alcohol abuse Other   . Diabetes Other   . Esophageal cancer Brother        Dead  . Esophageal cancer Brother        alive  . Colon polyps Father   .  Heart disease Neg Hx   . Hyperlipidemia Neg Hx   . Hypertension Neg Hx   . Kidney disease Neg Hx   . Stroke Neg Hx   . Early death Neg Hx   . Colon cancer Neg Hx   . Gallbladder disease Neg Hx    Social History   Socioeconomic History  . Marital status: Married    Spouse name: Not on file  . Number of children: 2  . Years of education: Not on file  . Highest education level: Not on file  Occupational History  . Occupation: Retired  Tobacco Use  . Smoking status: Never Smoker  . Smokeless tobacco: Never Used  . Tobacco comment: Regular exercise - Yes  Vaping Use  . Vaping Use:  Never used  Substance and Sexual Activity  . Alcohol use: Yes    Alcohol/week: 2.0 standard drinks    Types: 2 Glasses of wine per week    Comment: 2 in a week   . Drug use: No  . Sexual activity: Not Currently  Other Topics Concern  . Not on file  Social History Narrative  . Not on file   Social Determinants of Health   Financial Resource Strain: Low Risk   . Difficulty of Paying Living Expenses: Not hard at all  Food Insecurity: No Food Insecurity  . Worried About Charity fundraiser in the Last Year: Never true  . Ran Out of Food in the Last Year: Never true  Transportation Needs: No Transportation Needs  . Lack of Transportation (Medical): No  . Lack of Transportation (Non-Medical): No  Physical Activity: Sufficiently Active  . Days of Exercise per Week: 7 days  . Minutes of Exercise per Session: 30 min  Stress: No Stress Concern Present  . Feeling of Stress : Not at all  Social Connections: Socially Integrated  . Frequency of Communication with Friends and Family: More than three times a week  . Frequency of Social Gatherings with Friends and Family: Once a week  . Attends Religious Services: More than 4 times per year  . Active Member of Clubs or Organizations: No  . Attends Archivist Meetings: More than 4 times per year  . Marital Status: Married    Tobacco Counseling Counseling given: Not Answered Comment: Regular exercise - Yes   Clinical Intake:  Pre-visit preparation completed: Yes  Pain : No/denies pain     Nutritional Risks: None Diabetes: Yes CBG done?: No Did pt. bring in CBG monitor from home?: No  How often do you need to have someone help you when you read instructions, pamphlets, or other written materials from your doctor or pharmacy?: 1 - Never What is the last grade level you completed in school?: MBA from [employer]  Diabetic? yes  Interpreter Needed?: No  Information entered by :: Lisette Abu, LPN   Activities of  Daily Living In your present state of health, do you have any difficulty performing the following activities: 04/08/2020 02/08/2020  Hearing? N N  Vision? N N  Difficulty concentrating or making decisions? N N  Walking or climbing stairs? N N  Dressing or bathing? N N  Doing errands, shopping? N N  Preparing Food and eating ? N -  Using the Toilet? N -  In the past six months, have you accidently leaked urine? N -  Managing your Medications? N -  Managing your Finances? N -  Housekeeping or managing your Housekeeping? N -  Some recent data  might be hidden    Patient Care Team: Janith Lima, MD as PCP - General  Indicate any recent Medical Services you may have received from other than [facility] providers in the past year (date may be approximate).     Assessment:   This is a routine wellness examination for [place].  Hearing/Vision screen No exam data present  Dietary issues and exercise activities discussed: Current Exercise Habits: Home exercise routine, Time (Minutes): 30, Frequency (Times/Week): 7, Weekly Exercise (Minutes/Week): 210, Intensity: Moderate  Goals    . Patient Stated     To maintain my current health status by continuing to eat healthy, stay physically active and socially active.      Depression Screen PHQ 2/9 Scores 04/08/2020 02/08/2020 08/11/2018 02/20/2017 02/18/2017 01/22/2015 02/05/2014  PHQ - 2 Score 0 0 0 0 0 0 0    Fall Risk Fall Risk  04/08/2020 02/08/2020 08/11/2018 02/20/2017 02/18/2017  Falls in the past year? 0 0 0 No No  Number falls in past yr: 0 - 0 - -  Injury with Fall? 0 - 0 - -  Risk for fall due to : No Fall Risks - - - -  Follow up - - Falls evaluation completed - -    FALL RISK PREVENTION PERTAINING TO THE HOME:  Any stairs in or around the home? Yes  If so, are there any without handrails? No  Home free of loose throw rugs in walkways, pet beds, electrical cords, etc? Yes  Adequate lighting in your home to reduce risk of falls?  Yes   ASSISTIVE DEVICES UTILIZED TO PREVENT FALLS:  Life alert? No  Use of a cane, walker or w/c? No  Grab bars in the bathroom? Yes  Shower chair or bench in shower? Yes  Elevated toilet seat or a handicapped toilet? No   TIMED UP AND GO:  Was the test performed? No .  Length of time to ambulate 10 feet: 0 sec.   Gait steady and fast without use of assistive device  Cognitive Function: No flowsheet data found.         Immunizations Immunization History  Administered Date(s) Administered  . Hep A / Hep B 02/21/2018, 03/21/2018, 08/11/2018  . Influenza Whole 12/04/2008, 01/12/2012  . Influenza, High Dose Seasonal PF 12/07/2017, 10/21/2018  . Influenza,inj,Quad PF,6+ Mos 12/12/2016  . Influenza-Unspecified 11/30/2013, 11/05/2015, 11/10/2019  . PFIZER(Purple Top)SARS-COV-2 Vaccination 03/30/2019, 04/24/2019  . Pneumococcal Conjugate-13 12/06/2014  . Pneumococcal Polysaccharide-23 01/13/2012, 02/07/2019  . Tdap 01/13/2012  . Zoster 01/12/2011    TDAP status: Up to date  Flu Vaccine status: Up to date  Pneumococcal vaccine status: Up to date  Covid-19 vaccine status: Completed vaccines  Qualifies for Shingles Vaccine? Yes   Zostavax completed Yes   Shingrix Completed?: No.    Education has been provided regarding the importance of this vaccine. Patient has been advised to call insurance company to determine out of pocket expense if they have not yet received this vaccine. Advised may also receive vaccine at local pharmacy or Health Dept. Verbalized acceptance and understanding.  Screening Tests Health Maintenance  Topic Date Due  . OPHTHALMOLOGY EXAM  02/04/2019  . COVID-19 Vaccine (3 - Booster for Pfizer series) 10/25/2019  . HEMOGLOBIN A1C  08/08/2020  . FOOT EXAM  02/07/2021  . URINE MICROALBUMIN  02/07/2021  . TETANUS/TDAP  01/12/2022  . COLONOSCOPY (Pts 45-63yr Insurance coverage will need to be confirmed)  07/03/2025  . INFLUENZA VACCINE  Completed  .  Hepatitis  C Screening  Completed  . PNA vac Low Risk Adult  Completed    Health Maintenance  Health Maintenance Due  Topic Date Due  . OPHTHALMOLOGY EXAM  02/04/2019  . COVID-19 Vaccine (3 - Booster for Pfizer series) 10/25/2019    Colorectal cancer screening: Type of screening: Colonoscopy. Completed 07/04/2015. Repeat every 10 years  Lung Cancer Screening: (Low Dose CT Chest recommended if Age 55-80 years, 30 pack-year currently smoking OR have quit w/in 15years.) does not qualify.   Lung Cancer Screening Referral: no  Additional Screening:  Hepatitis C Screening: does qualify; Completed yes  Vision Screening: Recommended annual ophthalmology exams for early detection of glaucoma and other disorders of the eye. Is the patient up to date with their annual eye exam?  Yes  Who is the provider or what is the name of the office in which the patient attends annual eye exams? Bing Quarry Scott, OD. If pt is not established with a provider, would they like to be referred to a provider to establish care? No .   Dental Screening: Recommended annual dental exams for proper oral hygiene  Community Resource Referral / Chronic Care Management: CRR required this visit?  No   CCM required this visit?  No      Plan:     I have personally reviewed and noted the following in the patient's chart:   . Medical and social history . Use of alcohol, tobacco or illicit drugs  . Current medications and supplements . Functional ability and status . Nutritional status . Physical activity . Advanced directives . List of other physicians . Hospitalizations, surgeries, and ER visits in previous 12 months . Vitals . Screenings to include cognitive, depression, and falls . Referrals and appointments  In addition, I have reviewed and discussed with patient certain preventive protocols, quality metrics, and best practice recommendations. A written personalized care plan for preventive services as well  as general preventive health recommendations were provided to patient.     Sheral Flow, LPN   5/50/1586   Nurse Notes:  Patient is cogitatively intact. There were no vitals filed for this visit. There is no height or weight on file to calculate BMI. Patient stated that he has no issues with gait or balance; does not use any assistive devices.

## 2020-04-17 DIAGNOSIS — H2513 Age-related nuclear cataract, bilateral: Secondary | ICD-10-CM | POA: Diagnosis not present

## 2020-04-25 LAB — HM DIABETES EYE EXAM

## 2020-06-03 DIAGNOSIS — L821 Other seborrheic keratosis: Secondary | ICD-10-CM | POA: Diagnosis not present

## 2020-06-03 DIAGNOSIS — D225 Melanocytic nevi of trunk: Secondary | ICD-10-CM | POA: Diagnosis not present

## 2020-06-03 DIAGNOSIS — L570 Actinic keratosis: Secondary | ICD-10-CM | POA: Diagnosis not present

## 2020-06-03 DIAGNOSIS — Z85828 Personal history of other malignant neoplasm of skin: Secondary | ICD-10-CM | POA: Diagnosis not present

## 2020-06-03 DIAGNOSIS — L218 Other seborrheic dermatitis: Secondary | ICD-10-CM | POA: Diagnosis not present

## 2020-06-03 DIAGNOSIS — L814 Other melanin hyperpigmentation: Secondary | ICD-10-CM | POA: Diagnosis not present

## 2020-06-03 DIAGNOSIS — L905 Scar conditions and fibrosis of skin: Secondary | ICD-10-CM | POA: Diagnosis not present

## 2020-06-24 ENCOUNTER — Other Ambulatory Visit: Payer: Self-pay | Admitting: Internal Medicine

## 2020-06-24 DIAGNOSIS — E785 Hyperlipidemia, unspecified: Secondary | ICD-10-CM

## 2020-06-24 DIAGNOSIS — E118 Type 2 diabetes mellitus with unspecified complications: Secondary | ICD-10-CM

## 2020-07-24 DIAGNOSIS — U071 COVID-19: Secondary | ICD-10-CM | POA: Diagnosis not present

## 2020-08-07 ENCOUNTER — Other Ambulatory Visit: Payer: Self-pay

## 2020-08-08 ENCOUNTER — Encounter: Payer: Self-pay | Admitting: Internal Medicine

## 2020-08-08 ENCOUNTER — Ambulatory Visit (INDEPENDENT_AMBULATORY_CARE_PROVIDER_SITE_OTHER): Payer: Medicare Other | Admitting: Internal Medicine

## 2020-08-08 ENCOUNTER — Other Ambulatory Visit: Payer: Self-pay

## 2020-08-08 VITALS — BP 132/76 | HR 79 | Temp 98.4°F | Ht 69.0 in | Wt 187.0 lb

## 2020-08-08 DIAGNOSIS — Z23 Encounter for immunization: Secondary | ICD-10-CM | POA: Insufficient documentation

## 2020-08-08 DIAGNOSIS — E118 Type 2 diabetes mellitus with unspecified complications: Secondary | ICD-10-CM | POA: Diagnosis not present

## 2020-08-08 DIAGNOSIS — E785 Hyperlipidemia, unspecified: Secondary | ICD-10-CM

## 2020-08-08 DIAGNOSIS — I10 Essential (primary) hypertension: Secondary | ICD-10-CM | POA: Diagnosis not present

## 2020-08-08 LAB — BASIC METABOLIC PANEL
BUN: 17 mg/dL (ref 6–23)
CO2: 29 mEq/L (ref 19–32)
Calcium: 9.6 mg/dL (ref 8.4–10.5)
Chloride: 100 mEq/L (ref 96–112)
Creatinine, Ser: 0.97 mg/dL (ref 0.40–1.50)
GFR: 76.64 mL/min (ref >60.00)
Glucose, Bld: 214 mg/dL — ABNORMAL HIGH (ref 70–99)
Potassium: 4 mEq/L (ref 3.5–5.1)
Sodium: 135 mEq/L (ref 135–145)

## 2020-08-08 LAB — HEMOGLOBIN A1C: Hgb A1c MFr Bld: 7.7 % — ABNORMAL HIGH (ref 4.6–6.5)

## 2020-08-08 MED ORDER — ATORVASTATIN CALCIUM 40 MG PO TABS: 1.0000 | ORAL_TABLET | Freq: Every day | ORAL | 1 refills | Status: DC

## 2020-08-08 MED ORDER — SHINGRIX 50 MCG/0.5ML IM SUSR: 0.5000 mL | Freq: Once | INTRAMUSCULAR | 1 refills | Status: AC

## 2020-08-08 MED ORDER — RYBELSUS 3 MG PO TABS
3.0000 mg | ORAL_TABLET | Freq: Every day | ORAL | 0 refills | Status: DC
Start: 2020-08-08 — End: 2020-08-12

## 2020-08-08 MED ORDER — METFORMIN HCL ER 750 MG PO TB24: 750.0000 mg | ORAL_TABLET | Freq: Every day | ORAL | 1 refills | Status: DC

## 2020-08-08 NOTE — Patient Instructions (Signed)

## 2020-08-08 NOTE — Progress Notes (Signed)
Subjective:  Patient ID: Taylor Torres, male    DOB: 1945/05/21  Age: 75 y.o. MRN: [id number]  CC: Hypertension and Diabetes  This visit occurred during the SARS-CoV-2 public health emergency.  Safety protocols were in place, including screening questions prior to the visit, additional usage of staff PPE, and extensive cleaning of exam room while observing appropriate contact time as indicated for disinfecting solutions.    HPI [name] presents for f/up -   He has felt well recently and offers no complaints.  He is active and denies any recent episodes of chest pain, shortness of breath, dyspnea on exertion, diaphoresis, edema, or fatigue.  Outpatient Medications Prior to Visit  Medication Sig Dispense Refill   B Complex-Biotin-FA (B-COMPLEX PO) Take 1 tablet by mouth daily.     Blood Glucose Monitoring Suppl (ACCU-CHEK GUIDE ME) w/Device KIT 1 Act by Does not apply route 2 (two) times a day. 2 kit 0   Cholecalciferol (VITAMIN D3) 1000 units CAPS Take 2,000 Units by mouth.     CINNAMON PO Take 1 capsule by mouth daily.     Coenzyme Q10 (CO Q-10) 120 MG CAPS Take 1 capsule by mouth daily.      Flax Oil-Fish Oil-Borage Oil (FISH OIL-FLAX OIL-BORAGE OIL PO) Take 1 each by mouth daily.     MAGNESIUM-ZINC PO Take 1 tablet by mouth daily.     Misc Natural Products (BLACK CHERRY CONCENTRATE PO) Take 1 each by mouth daily.      Misc Natural Products (GLUCOSAMINE CHOND COMPLEX/MSM PO) Take 1 each by mouth daily.      Multiple Vitamin (MULTI-VITAMIN DAILY PO) Take 1 each by mouth daily.      Turmeric 500 MG CAPS Take by mouth.     atorvastatin (LIPITOR) 40 MG tablet TAKE 1 TABLET BY MOUTH  DAILY 90 tablet 0   metFORMIN (GLUCOPHAGE-XR) 750 MG 24 hr tablet TAKE 1 TABLET BY MOUTH  DAILY WITH BREAKFAST 90 tablet 0   No facility-administered medications prior to visit.    ROS Review of Systems  Constitutional:  Negative for diaphoresis, fatigue and unexpected weight change.  HENT:  Negative.    Eyes: Negative.   Respiratory:  Negative for cough, chest tightness, shortness of breath and wheezing.   Cardiovascular:  Negative for chest pain, palpitations and leg swelling.  Gastrointestinal:  Negative for abdominal pain, constipation, diarrhea, nausea and vomiting.  Endocrine: Negative.  Negative for cold intolerance and heat intolerance.  Genitourinary: Negative.  Negative for difficulty urinating.  Musculoskeletal:  Negative for arthralgias and myalgias.  Skin: Negative.  Negative for color change.  Neurological: Negative.  Negative for dizziness, weakness, light-headedness and headaches.  Hematological:  Negative for adenopathy. Does not bruise/bleed easily.  Psychiatric/Behavioral: Negative.     Objective:  BP 132/76 (BP Location: Left Arm, Patient Position: Sitting, Cuff Size: Large)   Pulse 79   Temp 98.4 F (36.9 C) (Oral)   Ht 5' 9" (1.753 m)   Wt 187 lb (84.8 kg)   SpO2 96%   BMI 27.62 kg/m   BP Readings from Last 3 Encounters:  08/08/20 132/76  02/08/20 122/72  02/01/20 (!) 104/57    Wt Readings from Last 3 Encounters:  08/08/20 187 lb (84.8 kg)  02/08/20 188 lb (85.3 kg)  12/13/19 187 lb 12.8 oz (85.2 kg)    Physical Exam Vitals reviewed.  Constitutional:      Appearance: Normal appearance.  HENT:     Nose: Nose normal.  Mouth/Throat:     Mouth: Mucous membranes are moist.  Eyes:     General: No scleral icterus.    Conjunctiva/sclera: Conjunctivae normal.  Cardiovascular:     Rate and Rhythm: Normal rate and regular rhythm.     Heart sounds: No murmur heard. Pulmonary:     Effort: Pulmonary effort is normal.     Breath sounds: No stridor. No wheezing, rhonchi or rales.  Abdominal:     General: Abdomen is protuberant. Bowel sounds are normal. There is no distension.     Palpations: Abdomen is soft. There is no hepatomegaly, splenomegaly or mass.     Tenderness: There is no abdominal tenderness.  Musculoskeletal:        General:  Normal range of motion.     Cervical back: Neck supple.     Right lower leg: No edema.     Left lower leg: No edema.  Lymphadenopathy:     Cervical: No cervical adenopathy.  Skin:    General: Skin is warm.  Neurological:     General: No focal deficit present.     Mental Status: He is alert.    Lab Results  Component Value Date   WBC 7.3 02/08/2020   HGB 13.3 02/08/2020   HCT 39.4 02/08/2020   PLT 197.0 02/08/2020   GLUCOSE 214 (H) 08/08/2020   CHOL 116 02/08/2020   TRIG 176.0 (H) 02/08/2020   HDL 38.00 (L) 02/08/2020   LDLDIRECT 87.0 02/03/2016   LDLCALC 42 02/08/2020   ALT 15 02/08/2020   AST 15 02/08/2020   NA 135 08/08/2020   K 4.0 08/08/2020   CL 100 08/08/2020   CREATININE 0.97 08/08/2020   BUN 17 08/08/2020   CO2 29 08/08/2020   TSH 1.53 02/08/2020   PSA 0.64 02/08/2020   HGBA1C 7.7 (H) 08/08/2020   MICROALBUR 1.0 02/08/2020    No results found.  Assessment & Plan:   [place] was seen today for hypertension and diabetes.  Diagnoses and all orders for this visit:  Type 2 diabetes mellitus with complication, without long-term current use of insulin ([place])- His A1c is up to 7.7%.  We will add a GLP-1 agonist to the metformin. -     Basic metabolic panel; Future -     Hemoglobin A1c; Future -     metFORMIN (GLUCOPHAGE-XR) 750 MG 24 hr tablet; Take 1 tablet (750 mg total) by mouth daily with breakfast. -     atorvastatin (LIPITOR) 40 MG tablet; Take 1 tablet (40 mg total) by mouth daily. -     Hemoglobin A1c -     Basic metabolic panel -     Semaglutide (RYBELSUS) 3 MG TABS; Take 3 mg by mouth daily.  Essential hypertension- His blood pressure is adequately well controlled. -     Basic metabolic panel; Future -     Basic metabolic panel  Hyperlipidemia with target LDL less than 130 -     atorvastatin (LIPITOR) 40 MG tablet; Take 1 tablet (40 mg total) by mouth daily.  Need for shingles vaccine -     Zoster Vaccine Adjuvanted [facility]) injection; Inject  0.5 mLs into the muscle once for 1 dose.  I have changed [name]. Sanor's metFORMIN and atorvastatin. I am also having him start on Shingrix and Rybelsus. Additionally, I am having him maintain his B Complex-Biotin-FA (B-COMPLEX PO), MAGNESIUM-ZINC PO, Vitamin D3, CINNAMON PO, Co Q-10, Misc Natural Products (BLACK CHERRY CONCENTRATE PO), Misc Natural Products (GLUCOSAMINE CHOND COMPLEX/MSM PO), Multiple  Vitamin (MULTI-VITAMIN DAILY PO), Flax Oil-Fish Oil-Borage Oil (FISH OIL-FLAX OIL-BORAGE OIL PO), Accu-Chek Guide Me, and Turmeric.  Meds ordered this encounter  Medications   metFORMIN (GLUCOPHAGE-XR) 750 MG 24 hr tablet    Sig: Take 1 tablet (750 mg total) by mouth daily with breakfast.    Dispense:  90 tablet    Refill:  1   atorvastatin (LIPITOR) 40 MG tablet    Sig: Take 1 tablet (40 mg total) by mouth daily.    Dispense:  90 tablet    Refill:  1   Zoster Vaccine Adjuvanted [facility]) injection    Sig: Inject 0.5 mLs into the muscle once for 1 dose.    Dispense:  0.5 mL    Refill:  1   Semaglutide (RYBELSUS) 3 MG TABS    Sig: Take 3 mg by mouth daily.    Dispense:  30 tablet    Refill:  0      Follow-up: Return in about 6 months (around 02/07/2021).  Scarlette Calico, MD

## 2020-08-12 ENCOUNTER — Other Ambulatory Visit: Payer: Self-pay | Admitting: Internal Medicine

## 2020-08-12 ENCOUNTER — Encounter: Payer: Self-pay | Admitting: Internal Medicine

## 2020-08-12 DIAGNOSIS — E118 Type 2 diabetes mellitus with unspecified complications: Secondary | ICD-10-CM

## 2020-08-12 MED ORDER — METFORMIN HCL ER 750 MG PO TB24: 1500.0000 mg | ORAL_TABLET | Freq: Every day | ORAL | 1 refills | Status: DC

## 2020-08-22 ENCOUNTER — Encounter: Payer: Self-pay | Admitting: Internal Medicine

## 2020-08-22 DIAGNOSIS — E118 Type 2 diabetes mellitus with unspecified complications: Secondary | ICD-10-CM

## 2020-08-23 MED ORDER — METFORMIN HCL ER 750 MG PO TB24: 1500.0000 mg | ORAL_TABLET | Freq: Every day | ORAL | 1 refills | Status: DC

## 2020-11-19 DIAGNOSIS — S39012A Strain of muscle, fascia and tendon of lower back, initial encounter: Secondary | ICD-10-CM | POA: Diagnosis not present

## 2020-12-09 DIAGNOSIS — L218 Other seborrheic dermatitis: Secondary | ICD-10-CM | POA: Diagnosis not present

## 2020-12-09 DIAGNOSIS — L718 Other rosacea: Secondary | ICD-10-CM | POA: Diagnosis not present

## 2020-12-31 DIAGNOSIS — M545 Low back pain, unspecified: Secondary | ICD-10-CM | POA: Diagnosis not present

## 2020-12-31 DIAGNOSIS — M5137 Other intervertebral disc degeneration, lumbosacral region: Secondary | ICD-10-CM | POA: Diagnosis not present

## 2021-02-11 ENCOUNTER — Encounter: Payer: Medicare Other | Admitting: Internal Medicine

## 2021-02-25 ENCOUNTER — Encounter: Payer: Self-pay | Admitting: Internal Medicine

## 2021-02-25 ENCOUNTER — Ambulatory Visit (INDEPENDENT_AMBULATORY_CARE_PROVIDER_SITE_OTHER): Payer: Medicare Other | Admitting: Internal Medicine

## 2021-02-25 ENCOUNTER — Other Ambulatory Visit: Payer: Self-pay

## 2021-02-25 VITALS — BP 126/76 | HR 70 | Temp 98.1°F | Ht 69.0 in | Wt 181.2 lb

## 2021-02-25 DIAGNOSIS — E118 Type 2 diabetes mellitus with unspecified complications: Secondary | ICD-10-CM | POA: Diagnosis not present

## 2021-02-25 DIAGNOSIS — R040 Epistaxis: Secondary | ICD-10-CM | POA: Diagnosis not present

## 2021-02-25 DIAGNOSIS — Z298 Encounter for other specified prophylactic measures: Secondary | ICD-10-CM

## 2021-02-25 DIAGNOSIS — I10 Essential (primary) hypertension: Secondary | ICD-10-CM

## 2021-02-25 DIAGNOSIS — Z23 Encounter for immunization: Secondary | ICD-10-CM

## 2021-02-25 DIAGNOSIS — E785 Hyperlipidemia, unspecified: Secondary | ICD-10-CM

## 2021-02-25 DIAGNOSIS — Z0001 Encounter for general adult medical examination with abnormal findings: Secondary | ICD-10-CM

## 2021-02-25 DIAGNOSIS — Z0000 Encounter for general adult medical examination without abnormal findings: Secondary | ICD-10-CM | POA: Diagnosis not present

## 2021-02-25 DIAGNOSIS — E781 Pure hyperglyceridemia: Secondary | ICD-10-CM

## 2021-02-25 DIAGNOSIS — N400 Enlarged prostate without lower urinary tract symptoms: Secondary | ICD-10-CM

## 2021-02-25 LAB — CBC WITH DIFFERENTIAL/PLATELET
Basophils Absolute: 0 10*3/uL (ref 0.0–0.1)
Basophils Relative: 0.5 % (ref 0.0–3.0)
Eosinophils Absolute: 0.2 10*3/uL (ref 0.0–0.7)
Eosinophils Relative: 2.5 % (ref 0.0–5.0)
HCT: 41.3 % (ref 39.0–52.0)
Hemoglobin: 13.8 g/dL (ref 13.0–17.0)
Lymphocytes Relative: 25.8 % (ref 12.0–46.0)
Lymphs Abs: 1.6 10*3/uL (ref 0.7–4.0)
MCHC: 33.5 g/dL (ref 30.0–36.0)
MCV: 92.9 fl (ref 78.0–100.0)
Monocytes Absolute: 0.5 10*3/uL (ref 0.1–1.0)
Monocytes Relative: 7.3 % (ref 3.0–12.0)
Neutro Abs: 4 10*3/uL (ref 1.4–7.7)
Neutrophils Relative %: 63.9 % (ref 43.0–77.0)
Platelets: 202 10*3/uL (ref 150.0–400.0)
RBC: 4.44 Mil/uL (ref 4.22–5.81)
RDW: 13 % (ref 11.5–15.5)
WBC: 6.2 10*3/uL (ref 4.0–10.5)

## 2021-02-25 LAB — BASIC METABOLIC PANEL
BUN: 18 mg/dL (ref 6–23)
CO2: 29 mEq/L (ref 19–32)
Calcium: 9.7 mg/dL (ref 8.4–10.5)
Chloride: 100 mEq/L (ref 96–112)
Creatinine, Ser: 0.87 mg/dL (ref 0.40–1.50)
GFR: 84.38 mL/min (ref >60.00)
Glucose, Bld: 131 mg/dL — ABNORMAL HIGH (ref 70–99)
Potassium: 4.5 mEq/L (ref 3.5–5.1)
Sodium: 138 mEq/L (ref 135–145)

## 2021-02-25 LAB — URINALYSIS, ROUTINE W REFLEX MICROSCOPIC
Bilirubin Urine: NEGATIVE
Hgb urine dipstick: NEGATIVE
Leukocytes,Ua: NEGATIVE
Nitrite: NEGATIVE
RBC / HPF: NONE SEEN (ref 0–?)
Specific Gravity, Urine: 1.025 (ref 1.000–1.030)
Total Protein, Urine: NEGATIVE
Urine Glucose: NEGATIVE
Urobilinogen, UA: 0.2 (ref 0.0–1.0)
pH: 5.5 (ref 5.0–8.0)

## 2021-02-25 LAB — MICROALBUMIN / CREATININE URINE RATIO
Creatinine,U: 134.1 mg/dL
Microalb Creat Ratio: 1.4 mg/g (ref 0.0–30.0)
Microalb, Ur: 1.9 mg/dL (ref 0.0–1.9)

## 2021-02-25 LAB — HEPATIC FUNCTION PANEL
ALT: 14 U/L (ref 0–53)
AST: 13 U/L (ref 0–37)
Albumin: 4.6 g/dL (ref 3.5–5.2)
Alkaline Phosphatase: 54 U/L (ref 39–117)
Bilirubin, Direct: 0.1 mg/dL (ref 0.0–0.3)
Total Bilirubin: 0.8 mg/dL (ref 0.2–1.2)
Total Protein: 7.2 g/dL (ref 6.0–8.3)

## 2021-02-25 LAB — LIPID PANEL
Cholesterol: 121 mg/dL (ref 0–200)
HDL: 34.3 mg/dL — ABNORMAL LOW (ref >39.00)
LDL Cholesterol: 61 mg/dL (ref 0–99)
NonHDL: 86.91
Total CHOL/HDL Ratio: 4
Triglycerides: 128 mg/dL (ref 0.0–149.0)
VLDL: 25.6 mg/dL (ref 0.0–40.0)

## 2021-02-25 LAB — HEMOGLOBIN A1C: Hgb A1c MFr Bld: 7.3 % — ABNORMAL HIGH (ref 4.6–6.5)

## 2021-02-25 LAB — PSA: PSA: 0.62 ng/mL (ref 0.10–4.00)

## 2021-02-25 LAB — TSH: TSH: 1.11 u[IU]/mL (ref 0.35–5.50)

## 2021-02-25 MED ORDER — SHINGRIX 50 MCG/0.5ML IM SUSR: 0.5000 mL | Freq: Once | INTRAMUSCULAR | 1 refills | Status: AC

## 2021-02-25 MED ORDER — SHINGRIX 50 MCG/0.5ML IM SUSR: 0.5000 mL | Freq: Once | INTRAMUSCULAR | 1 refills | Status: DC

## 2021-02-25 NOTE — Progress Notes (Signed)
Subjective:  Patient ID: Taylor Torres, male    DOB: [birth year]  Age: 76 y.o. MRN: [id number]  CC: Annual Exam, Hypertension, Hyperlipidemia, and Diabetes  This visit occurred during the SARS-CoV-2 public health emergency.  Safety protocols were in place, including screening questions prior to the visit, additional usage of staff PPE, and extensive cleaning of exam room while observing appropriate contact time as indicated for disinfecting solutions.    HPI TANDRE CONLY presents for a CPX and f/up -   He is traveling to [place] and needs Malaria prophylaxis.  He is active and denies chest pain, shortness of breath, diaphoresis, dizziness, lightheadedness, edema, or polys.  Outpatient Medications Prior to Visit  Medication Sig Dispense Refill   atorvastatin (LIPITOR) 40 MG tablet Take 1 tablet (40 mg total) by mouth daily. 90 tablet 1   B Complex-Biotin-FA (B-COMPLEX PO) Take 1 tablet by mouth daily.     Blood Glucose Monitoring Suppl (ACCU-CHEK GUIDE ME) w/Device KIT 1 Act by Does not apply route 2 (two) times a day. 2 kit 0   Cholecalciferol (VITAMIN D3) 1000 units CAPS Take 2,000 Units by mouth.     CINNAMON PO Take 1 capsule by mouth daily.     Coenzyme Q10 (CO Q-10) 120 MG CAPS Take 1 capsule by mouth daily.      Flax Oil-Fish Oil-Borage Oil (FISH OIL-FLAX OIL-BORAGE OIL PO) Take 1 each by mouth daily.     MAGNESIUM-ZINC PO Take 1 tablet by mouth daily.     metFORMIN (GLUCOPHAGE-XR) 750 MG 24 hr tablet Take 2 tablets (1,500 mg total) by mouth daily with breakfast. 180 tablet 1   Misc Natural Products (BLACK CHERRY CONCENTRATE PO) Take 1 each by mouth daily.      Misc Natural Products (GLUCOSAMINE CHOND COMPLEX/MSM PO) Take 1 each by mouth daily.      Multiple Vitamin (MULTI-VITAMIN DAILY PO) Take 1 each by mouth daily.      Turmeric 500 MG CAPS Take by mouth.     hydrocortisone 2.5 % cream Apply topically 2 (two) times daily as needed. (Patient not taking: Reported on 02/25/2021)      metroNIDAZOLE (METROCREAM) 0.75 % cream Apply topically 2 (two) times daily. (Patient not taking: Reported on 02/25/2021)     No facility-administered medications prior to visit.    ROS Review of Systems  Constitutional:  Negative for appetite change, diaphoresis, fatigue and unexpected weight change.  HENT:  Positive for nosebleeds. Negative for facial swelling, sinus pressure, sore throat and trouble swallowing.        Intermittent right nosebleed  Respiratory: Negative.  Negative for cough, shortness of breath and wheezing.   Cardiovascular:  Negative for chest pain, palpitations and leg swelling.  Gastrointestinal: Negative.  Negative for abdominal pain, blood in stool, constipation, diarrhea, nausea and vomiting.  Endocrine: Negative.   Genitourinary: Negative.  Negative for difficulty urinating, dysuria and hematuria.  Musculoskeletal: Negative.  Negative for arthralgias, back pain, myalgias and neck pain.  Skin: Negative.   Neurological: Negative.  Negative for dizziness, weakness, light-headedness and headaches.  Hematological:  Negative for adenopathy. Does not bruise/bleed easily.  Psychiatric/Behavioral: Negative.     Objective:  BP 126/76 (BP Location: Left Arm, Patient Position: Sitting, Cuff Size: Large)    Pulse 70    Temp 98.1 F (36.7 C) (Oral)    Ht '5\' 9"'  (1.753 m)    Wt 181 lb 3.2 oz (82.2 kg)    SpO2 98%    BMI  26.76 kg/m   BP Readings from Last 3 Encounters:  02/25/21 126/76  08/08/20 132/76  02/08/20 122/72    Wt Readings from Last 3 Encounters:  02/25/21 181 lb 3.2 oz (82.2 kg)  08/08/20 187 lb (84.8 kg)  02/08/20 188 lb (85.3 kg)    Physical Exam Vitals reviewed.  Constitutional:      Appearance: Normal appearance.  HENT:     Nose: Nose normal. No congestion.     Right Nostril: No foreign body or epistaxis.     Left Nostril: No foreign body or epistaxis.     Mouth/Throat:     Mouth: Mucous membranes are moist.     Pharynx: No oropharyngeal  exudate.  Eyes:     General: No scleral icterus.    Conjunctiva/sclera: Conjunctivae normal.  Cardiovascular:     Rate and Rhythm: Normal rate and regular rhythm.     Pulses: Normal pulses.     Heart sounds: No murmur heard. Pulmonary:     Effort: Pulmonary effort is normal.     Breath sounds: No stridor. No wheezing, rhonchi or rales.  Abdominal:     General: Abdomen is flat.     Palpations: There is no mass.     Tenderness: There is no abdominal tenderness. There is no guarding.     Hernia: No hernia is present.  Musculoskeletal:        General: Normal range of motion.     Cervical back: Neck supple. No tenderness.     Right lower leg: No edema.     Left lower leg: No edema.  Lymphadenopathy:     Cervical: No cervical adenopathy.  Skin:    General: Skin is warm and dry.  Neurological:     General: No focal deficit present.     Mental Status: He is alert. Mental status is at baseline.  Psychiatric:        Mood and Affect: Mood normal.        Behavior: Behavior normal.    Lab Results  Component Value Date   WBC 6.2 02/25/2021   HGB 13.8 02/25/2021   HCT 41.3 02/25/2021   PLT 202.0 02/25/2021   GLUCOSE 131 (H) 02/25/2021   CHOL 121 02/25/2021   TRIG 128.0 02/25/2021   HDL 34.30 (L) 02/25/2021   LDLDIRECT 87.0 02/03/2016   LDLCALC 61 02/25/2021   ALT 14 02/25/2021   AST 13 02/25/2021   NA 138 02/25/2021   K 4.5 02/25/2021   CL 100 02/25/2021   CREATININE 0.87 02/25/2021   BUN 18 02/25/2021   CO2 29 02/25/2021   TSH 1.11 02/25/2021   PSA 0.62 02/25/2021   HGBA1C 7.3 (H) 02/25/2021   MICROALBUR 1.9 02/25/2021    No results found.  Assessment & Plan:   Ottavio was seen today for annual exam, hypertension, hyperlipidemia and diabetes.  Diagnoses and all orders for this visit:  Essential hypertension- His BP is well controlled. -     Basic metabolic panel; Future -     CBC with Differential/Platelet; Future -     TSH; Future -     Urinalysis, Routine w  reflex microscopic; Future -     Urinalysis, Routine w reflex microscopic -     TSH -     CBC with Differential/Platelet -     Basic metabolic panel  Type 2 diabetes mellitus with complication, without long-term current use of insulin ([place])- His blood sugar is adequately well controlled. -  Basic metabolic panel; Future -     Microalbumin / creatinine urine ratio; Future -     Hemoglobin A1c; Future -     HM Diabetes Foot Exam -     Hemoglobin A1c -     Microalbumin / creatinine urine ratio -     Basic metabolic panel  BPH without obstruction/lower urinary tract symptoms- His PSA is normal. -     PSA; Future -     PSA  Hyperlipidemia with target LDL less than 130- LDL goal achieved. Doing well on the statin  -     Lipid panel; Future -     TSH; Future -     Hepatic function panel; Future -     Hepatic function panel -     TSH -     Lipid panel  Pure hypertriglyceridemia- Trigs are normal now. -     Lipid panel; Future -     Lipid panel  Encounter for general adult medical examination with abnormal findings- Exam completed, labs reviewed, vaccines reviewed and updated, cancer screenings are up-to-date, patient education was given.  Right-sided epistaxis -     Ambulatory referral to ENT  Need for shingles vaccine -     Discontinue: Zoster Vaccine Adjuvanted [facility]) injection; Inject 0.5 mLs into the muscle once for 1 dose. -     Zoster Vaccine Adjuvanted [facility]) injection; Inject 0.5 mLs into the muscle once for 1 dose.  Need for malaria prophylaxis -     atovaquone-proguanil (MALARONE) 250-100 MG TABS tablet; Take 1 tablet by mouth daily. Take one tab with food daily. Start 2 days prior to and during travel. Continue taking daily for 7 days post-travel.   I am having Trino Higinbotham. Lazarz start on Shingrix and atovaquone-proguanil. I am also having him maintain his B Complex-Biotin-FA (B-COMPLEX PO), MAGNESIUM-ZINC PO, Vitamin D3, CINNAMON PO, Co Q-10, Misc Natural  Products (BLACK CHERRY CONCENTRATE PO), Misc Natural Products (GLUCOSAMINE CHOND COMPLEX/MSM PO), Multiple Vitamin (MULTI-VITAMIN DAILY PO), Flax Oil-Fish Oil-Borage Oil (FISH OIL-FLAX OIL-BORAGE OIL PO), Accu-Chek Guide Me, Turmeric, atorvastatin, metFORMIN, metroNIDAZOLE, and hydrocortisone.  Meds ordered this encounter  Medications   DISCONTD: Zoster Vaccine Adjuvanted [facility]) injection    Sig: Inject 0.5 mLs into the muscle once for 1 dose.    Dispense:  0.5 mL    Refill:  1   Zoster Vaccine Adjuvanted [facility]) injection    Sig: Inject 0.5 mLs into the muscle once for 1 dose.    Dispense:  0.5 mL    Refill:  1   atovaquone-proguanil (MALARONE) 250-100 MG TABS tablet    Sig: Take 1 tablet by mouth daily. Take one tab with food daily. Start 2 days prior to and during travel. Continue taking daily for 7 days post-travel.    Dispense:  21 tablet    Refill:  0     Follow-up: Return in about 6 months (around 08/25/2021).  Scarlette Calico, MD

## 2021-02-25 NOTE — Patient Instructions (Signed)

## 2021-02-26 ENCOUNTER — Encounter: Payer: Self-pay | Admitting: Internal Medicine

## 2021-02-26 ENCOUNTER — Other Ambulatory Visit: Payer: Self-pay | Admitting: Internal Medicine

## 2021-02-26 DIAGNOSIS — Z298 Encounter for other specified prophylactic measures: Secondary | ICD-10-CM | POA: Insufficient documentation

## 2021-02-26 MED ORDER — ATOVAQUONE-PROGUANIL HCL 250-100 MG PO TABS: 1.0000 | ORAL_TABLET | Freq: Every day | ORAL | 0 refills | Status: DC

## 2021-03-03 ENCOUNTER — Other Ambulatory Visit: Payer: Self-pay | Admitting: Internal Medicine

## 2021-03-03 DIAGNOSIS — E118 Type 2 diabetes mellitus with unspecified complications: Secondary | ICD-10-CM

## 2021-04-23 DIAGNOSIS — H25013 Cortical age-related cataract, bilateral: Secondary | ICD-10-CM | POA: Diagnosis not present

## 2021-04-28 ENCOUNTER — Ambulatory Visit (INDEPENDENT_AMBULATORY_CARE_PROVIDER_SITE_OTHER): Payer: Medicare Other

## 2021-04-28 DIAGNOSIS — Z0000 Encounter for general adult medical examination without abnormal findings: Secondary | ICD-10-CM

## 2021-04-28 NOTE — Patient Instructions (Signed)
[name] , Thank you for taking time to come for your Medicare Wellness Visit. I appreciate your ongoing commitment to your health goals. Please review the following plan we discussed and let me know if I can assist you in the future.   Screening recommendations/referrals: Colonoscopy: 07/04/2015; due every 10 years Recommended yearly ophthalmology/optometry visit for glaucoma screening and checkup Recommended yearly dental visit for hygiene and checkup  Vaccinations: Influenza vaccine: 11/18/2020 Pneumococcal vaccine: 12/06/2014, 02/07/2019 Tdap vaccine: 01/13/2012; due every 10 years Shingles vaccine: 03/03/2021   Covid-19: 03/30/2019, 04/24/2019, 11/23/2019, 06/03/2020, 11/18/2020  Advanced directives: Yes; documents on file  Conditions/risks identified: Yes; Client understands the importance of follow-up appointments with providers by attending scheduled visits and discussed goals to eat healthier, increase physical activity 5 times a week for 30 minutes each, exercise the brain by doing stimulating brain exercises (reading, adult coloring, crafting, listening to music, puzzles, etc.), socialize and enjoy life more, get enough sleep at least 8-9 hours average per night and make time for laughter.  Next appointment: Please schedule your next Medicare Wellness Visit with your Nurse Health Advisor in 1 year by calling [phone].  Preventive Care 78 Years and Older, Male Preventive care refers to lifestyle choices and visits with your health care provider that can promote health and wellness. What does preventive care include? A yearly physical exam. This is also called an annual well check. Dental exams once or twice a year. Routine eye exams. Ask your health care provider how often you should have your eyes checked. Personal lifestyle choices, including: Daily care of your teeth and gums. Regular physical activity. Eating a healthy diet. Avoiding tobacco and drug use. Limiting alcohol  use. Practicing safe sex. Taking low doses of aspirin every day. Taking vitamin and mineral supplements as recommended by your health care provider. What happens during an annual well check? The services and screenings done by your health care provider during your annual well check will depend on your age, overall health, lifestyle risk factors, and family history of disease. Counseling  Your health care provider may ask you questions about your: Alcohol use. Tobacco use. Drug use. Emotional well-being. Home and relationship well-being. Sexual activity. Eating habits. History of falls. Memory and ability to understand (cognition). Work and work Statistician. Screening  You may have the following tests or measurements: Height, weight, and BMI. Blood pressure. Lipid and cholesterol levels. These may be checked every 5 years, or more frequently if you are over 27 years old. Skin check. Lung cancer screening. You may have this screening every year starting at age 62 if you have a 30-pack-year history of smoking and currently smoke or have quit within the past 15 years. Fecal occult blood test (FOBT) of the stool. You may have this test every year starting at age 61. Flexible sigmoidoscopy or colonoscopy. You may have a sigmoidoscopy every 5 years or a colonoscopy every 10 years starting at age 58. Prostate cancer screening. Recommendations will vary depending on your family history and other risks. Hepatitis C blood test. Hepatitis B blood test. Sexually transmitted disease (STD) testing. Diabetes screening. This is done by checking your blood sugar (glucose) after you have not eaten for a while (fasting). You may have this done every 1-3 years. Abdominal aortic aneurysm (AAA) screening. You may need this if you are a current or former smoker. Osteoporosis. You may be screened starting at age 26 if you are at high risk. Talk with your health care provider about your test results,  treatment options, and if necessary, the need for more tests. Vaccines  Your health care provider may recommend certain vaccines, such as: Influenza vaccine. This is recommended every year. Tetanus, diphtheria, and acellular pertussis (Tdap, Td) vaccine. You may need a Td booster every 10 years. Zoster vaccine. You may need this after age 65. Pneumococcal 13-valent conjugate (PCV13) vaccine. One dose is recommended after age 46. Pneumococcal polysaccharide (PPSV23) vaccine. One dose is recommended after age 36. Talk to your health care provider about which screenings and vaccines you need and how often you need them. This information is not intended to replace advice given to you by your health care provider. Make sure you discuss any questions you have with your health care provider. Document Released: 03/08/2015 Document Revised: 10/30/2015 Document Reviewed: 12/11/2014 Elsevier Interactive Patient Education  2017 [employer] Prevention in the Home Falls can cause injuries. They can happen to people of all ages. There are many things you can do to make your home safe and to help prevent falls. What can I do on the outside of my home? Regularly fix the edges of walkways and driveways and fix any cracks. Remove anything that might make you trip as you walk through a door, such as a raised step or threshold. Trim any bushes or trees on the path to your home. Use bright outdoor lighting. Clear any walking paths of anything that might make someone trip, such as rocks or tools. Regularly check to see if handrails are loose or broken. Make sure that both sides of any steps have handrails. Any raised decks and porches should have guardrails on the edges. Have any leaves, snow, or ice cleared regularly. Use sand or salt on walking paths during winter. Clean up any spills in your garage right away. This includes oil or grease spills. What can I do in the bathroom? Use night  lights. Install grab bars by the toilet and in the tub and shower. Do not use towel bars as grab bars. Use non-skid mats or decals in the tub or shower. If you need to sit down in the shower, use a plastic, non-slip stool. Keep the floor dry. Clean up any water that spills on the floor as soon as it happens. Remove soap buildup in the tub or shower regularly. Attach bath mats securely with double-sided non-slip rug tape. Do not have throw rugs and other things on the floor that can make you trip. What can I do in the bedroom? Use night lights. Make sure that you have a light by your bed that is easy to reach. Do not use any sheets or blankets that are too big for your bed. They should not hang down onto the floor. Have a firm chair that has side arms. You can use this for support while you get dressed. Do not have throw rugs and other things on the floor that can make you trip. What can I do in the kitchen? Clean up any spills right away. Avoid walking on wet floors. Keep items that you use a lot in easy-to-reach places. If you need to reach something above you, use a strong step stool that has a grab bar. Keep electrical cords out of the way. Do not use floor polish or wax that makes floors slippery. If you must use wax, use non-skid floor wax. Do not have throw rugs and other things on the floor that can make you trip. What can I do with my stairs? Do not  leave any items on the stairs. Make sure that there are handrails on both sides of the stairs and use them. Fix handrails that are broken or loose. Make sure that handrails are as long as the stairways. Check any carpeting to make sure that it is firmly attached to the stairs. Fix any carpet that is loose or worn. Avoid having throw rugs at the top or bottom of the stairs. If you do have throw rugs, attach them to the floor with carpet tape. Make sure that you have a light switch at the top of the stairs and the bottom of the stairs. If  you do not have them, ask someone to add them for you. What else can I do to help prevent falls? Wear shoes that: Do not have high heels. Have rubber bottoms. Are comfortable and fit you well. Are closed at the toe. Do not wear sandals. If you use a stepladder: Make sure that it is fully opened. Do not climb a closed stepladder. Make sure that both sides of the stepladder are locked into place. Ask someone to hold it for you, if possible. Clearly mark and make sure that you can see: Any grab bars or handrails. First and last steps. Where the edge of each step is. Use tools that help you move around (mobility aids) if they are needed. These include: Canes. Walkers. Scooters. Crutches. Turn on the lights when you go into a dark area. Replace any light bulbs as soon as they burn out. Set up your furniture so you have a clear path. Avoid moving your furniture around. If any of your floors are uneven, fix them. If there are any pets around you, be aware of where they are. Review your medicines with your doctor. Some medicines can make you feel dizzy. This can increase your chance of falling. Ask your doctor what other things that you can do to help prevent falls. This information is not intended to replace advice given to you by your health care provider. Make sure you discuss any questions you have with your health care provider. Document Released: 12/06/2008 Document Revised: 07/18/2015 Document Reviewed: 03/16/2014 Elsevier Interactive Patient Education  2017 [employer].

## 2021-04-28 NOTE — Progress Notes (Signed)
I connected with Taylor Torres. Spellman today by telephone and verified that I am speaking with the correct person using two identifiers. Location patient: home Location provider: work Persons participating in the virtual visit: patient, provider.   I discussed the limitations, risks, security and privacy concerns of performing an evaluation and management service by telephone and the availability of in person appointments. I also discussed with the patient that there may be a patient responsible charge related to this service. The patient expressed understanding and verbally consented to this telephonic visit.    Interactive audio and video telecommunications were attempted between this provider and patient, however failed, due to patient having technical difficulties OR patient did not have access to video capability.  We continued and completed visit with audio only.  Some vital signs may be absent or patient reported.   Time Spent with patient on telephone encounter: 40 minutes  Subjective:   Taylor Torres is a 76 y.o. male who presents for Medicare Annual/Subsequent preventive examination.  Review of Systems     Cardiac Risk Factors include: advanced age (>60mn, >>85women);diabetes mellitus;dyslipidemia;hypertension;male gender     Objective:    There were no vitals filed for this visit. There is no height or weight on file to calculate BMI.  Advanced Directives 04/28/2021 04/08/2020 05/04/2017 07/27/2016 07/27/2016 02/10/2016 07/02/2015  Does Patient Have a Medical Advance Directive? Yes Yes No No;Yes No Yes Yes  Type of Advance Directive Living will;Healthcare Power of Attorney Living will;Healthcare Power of [place] of [place] DNR (pink MOST or yellow form);Living will - [place]  Does patient want to make changes to medical advance directive? No - Patient declined No - Patient declined  - No - Patient declined - - -  Copy of [place] Chart? Yes - validated most recent copy scanned in chart (See row information) No - copy requested - No - copy requested - Yes -  Would patient like information on creating a medical advance directive? - - - No - Patient declined No - Patient declined - -  Pre-existing out of facility DNR order (yellow form or pink MOST form) - - - Physician notified to receive inpatient order - - -    Current Medications (verified) Outpatient Encounter Medications as of 04/28/2021  Medication Sig   atorvastatin (LIPITOR) 40 MG tablet Take 1 tablet (40 mg total) by mouth daily.   metFORMIN (GLUCOPHAGE-XR) 750 MG 24 hr tablet TAKE 2 TABLETS BY MOUTH  DAILY WITH BREAKFAST   B Complex-Biotin-FA (B-COMPLEX PO) Take 1 tablet by mouth daily.   Blood Glucose Monitoring Suppl (ACCU-CHEK GUIDE ME) w/Device KIT 1 Act by Does not apply route 2 (two) times a day.   Cholecalciferol (VITAMIN D3) 1000 units CAPS Take 2,000 Units by mouth.   CINNAMON PO Take 1 capsule by mouth daily.   Coenzyme Q10 (CO Q-10) 120 MG CAPS Take 1 capsule by mouth daily.    Flax Oil-Fish Oil-Borage Oil (FISH OIL-FLAX OIL-BORAGE OIL PO) Take 1 each by mouth daily.   hydrocortisone 2.5 % cream Apply topically 2 (two) times daily as needed. (Patient not taking: Reported on 02/25/2021)   MAGNESIUM-ZINC PO Take 1 tablet by mouth daily.   metroNIDAZOLE (METROCREAM) 0.75 % cream Apply topically 2 (two) times daily. (Patient not taking: Reported on 02/25/2021)   Misc Natural Products (BLACK CHERRY CONCENTRATE PO) Take 1 each by mouth daily.  Misc Natural Products (GLUCOSAMINE CHOND COMPLEX/MSM PO) Take 1 each by mouth daily.    Multiple Vitamin (MULTI-VITAMIN DAILY PO) Take 1 each by mouth daily.    Turmeric 500 MG CAPS Take by mouth.   [DISCONTINUED] atovaquone-proguanil (MALARONE) 250-100 MG TABS tablet Take 1 tablet by mouth daily. Take one tab with food daily. Start 2 days prior to  and during travel. Continue taking daily for 7 days post-travel.   No facility-administered encounter medications on file as of 04/28/2021.    Allergies (verified) Patient has no known allergies.   History: Past Medical History:  Diagnosis Date   Alcoholism in family    BPH (benign prostatic hypertrophy)    Diabetes mellitus without complication ([place])    Diverticulosis    Family history of diabetes mellitus    History of colonic polyps    Hyperlipidemia    Past Surgical History:  Procedure Laterality Date   COLONOSCOPY W/ POLYPECTOMY     none     Family History  Problem Relation Age of Onset   Alcohol abuse Other    Diabetes Other    Esophageal cancer Brother        Dead   Esophageal cancer Brother        alive   Colon polyps Father    Heart disease Neg Hx    Hyperlipidemia Neg Hx    Hypertension Neg Hx    Kidney disease Neg Hx    Stroke Neg Hx    Early death Neg Hx    Colon cancer Neg Hx    Gallbladder disease Neg Hx    Social History   Socioeconomic History   Marital status: Married    Spouse name: Not on file   Number of children: 2   Years of education: Not on file   Highest education level: Not on file  Occupational History   Occupation: Retired  Tobacco Use   Smoking status: Never   Smokeless tobacco: Never   Tobacco comments:    Regular exercise - Yes  Vaping Use   Vaping Use: Never used  Substance and Sexual Activity   Alcohol use: Yes    Alcohol/week: 2.0 standard drinks    Types: 2 Glasses of wine per week    Comment: 2 in a week    Drug use: No   Sexual activity: Not Currently  Other Topics Concern   Not on file  Social History Narrative   Not on file   Social Determinants of Health   Financial Resource Strain: Low Risk    Difficulty of Paying Living Expenses: Not hard at all  Food Insecurity: No Food Insecurity   Worried About Charity fundraiser in the Last Year: Never true   [place] in the Last Year: Never true   Transportation Needs: No Transportation Needs   Lack of Transportation (Medical): No   Lack of Transportation (Non-Medical): No  Physical Activity: Sufficiently Active   Days of Exercise per Week: 7 days   Minutes of Exercise per Session: 30 min  Stress: No Stress Concern Present   Feeling of Stress : Not at all  Social Connections: Socially Integrated   Frequency of Communication with Friends and Family: More than three times a week   Frequency of Social Gatherings with Friends and Family: Once a week   Attends Religious Services: More than 4 times per year   Active Member of [employer] or Organizations: No   Attends Music therapist: More  than 4 times per year   Marital Status: Married    Tobacco Counseling Counseling given: Not Answered Tobacco comments: Regular exercise - Yes   Clinical Intake:  Pre-visit preparation completed: Yes  Pain : No/denies pain     Nutritional Risks: None Diabetes: Yes CBG done?: No Did pt. bring in CBG monitor from home?: No  How often do you need to have someone help you when you read instructions, pamphlets, or other written materials from your doctor or pharmacy?: 1 - Never What is the last grade level you completed in school?: Master's Degree in Business Administration  Diabetic? yes  Interpreter Needed?: No  Information entered by :: Lisette Abu, LPN   Activities of Daily Living In your present state of health, do you have any difficulty performing the following activities: 04/28/2021  Hearing? N  Vision? N  Difficulty concentrating or making decisions? N  Walking or climbing stairs? N  Dressing or bathing? N  Doing errands, shopping? N  Preparing Food and eating ? N  Using the Toilet? N  In the past six months, have you accidently leaked urine? N  Do you have problems with loss of bowel control? N  Managing your Medications? N  Managing your Finances? N  Housekeeping or managing your Housekeeping? N  Some  recent data might be hidden    Patient Care Team: Janith Lima, MD as PCP - General Macarthur Critchley, OD as Referring Physician (Optometry)  Indicate any recent Medical Services you may have received from other than [facility] providers in the past year (date may be approximate).     Assessment:   This is a routine wellness examination for Jakoby.  Hearing/Vision screen Hearing Screening - Comments:: Patient denied any hearing difficulty.   No hearing aids.  Vision Screening - Comments:: Patient does wear corrective lenses/contacts.   Eye exam done by: Bing Quarry. Scott, OD.  Dietary issues and exercise activities discussed: Current Exercise Habits: Home exercise routine, Type of exercise: walking;treadmill, Time (Minutes): 30, Frequency (Times/Week): 7, Weekly Exercise (Minutes/Week): 210, Intensity: Moderate, Exercise limited by: None identified   Goals Addressed             This Visit's Progress    Patient Stated       My goal is to watch my HgA1C by watching my sugar intake and staying away from drinks/sugars.  Also I would like to get my BMI down some by continuing to be physically active.      Depression Screen PHQ 2/9 Scores 04/28/2021 02/25/2021 04/08/2020 02/08/2020 08/11/2018 02/20/2017 02/18/2017  PHQ - 2 Score 0 0 0 0 0 0 0    Fall Risk Fall Risk  04/28/2021 02/25/2021 04/08/2020 02/08/2020 08/11/2018  Falls in the past year? 0 0 0 0 0  Number falls in past yr: 0 0 0 - 0  Injury with Fall? 0 0 0 - 0  Risk for fall due to : No Fall Risks - No Fall Risks - -  Follow up Falls evaluation completed - - - Falls evaluation completed    FALL RISK PREVENTION PERTAINING TO THE HOME:  Any stairs in or around the home? Yes  If so, are there any without handrails? No  Home free of loose throw rugs in walkways, pet beds, electrical cords, etc? Yes  Adequate lighting in your home to reduce risk of falls? Yes   ASSISTIVE DEVICES UTILIZED TO PREVENT FALLS:  Life alert? No  Use of a cane,  walker or w/c?  No  Grab bars in the bathroom? Yes  Shower chair or bench in shower? Yes  Elevated toilet seat or a handicapped toilet? No   TIMED UP AND GO:  Was the test performed? No .  Length of time to ambulate 10 feet: n/a sec.   Gait steady and fast without use of assistive device (per patient)  Cognitive Function: Normal cognitive status assessed by direct observation by this Nurse Health Advisor. No abnormalities found.          Immunizations Immunization History  Administered Date(s) Administered   Hep A / Hep B 02/21/2018, 03/21/2018, 08/11/2018   Influenza Whole 12/04/2008, 01/12/2012   Influenza, High Dose Seasonal PF 12/07/2017, 10/21/2018, 11/18/2020   Influenza,inj,Quad PF,6+ Mos 12/12/2016   Influenza-Unspecified 11/30/2013, 11/05/2015, 11/10/2019   Moderna SARS-COV2 Booster Vaccination 06/03/2020   PFIZER(Purple Top)SARS-COV-2 Vaccination 03/30/2019, 04/24/2019, 11/23/2019, 11/18/2020   Pneumococcal Conjugate-13 12/06/2014   Pneumococcal Polysaccharide-23 01/13/2012, 02/07/2019   Tdap 01/13/2012   Zoster Recombinat (Shingrix) 03/03/2021   Zoster, Live 01/12/2011    TDAP status: Up to date  Flu Vaccine status: Up to date  Pneumococcal vaccine status: Up to date  Covid-19 vaccine status: Completed vaccines  Qualifies for Shingles Vaccine? Yes   Zostavax completed Yes   Shingrix Completed?: No.    Education has been provided regarding the importance of this vaccine. Patient has been advised to call insurance company to determine out of pocket expense if they have not yet received this vaccine. Advised may also receive vaccine at local pharmacy or Health Dept. Verbalized acceptance and understanding.  Screening Tests Health Maintenance  Topic Date Due   COVID-19 Vaccine (5 - Booster for Pfizer series) 01/13/2021   Zoster Vaccines- Shingrix (2 of 2) 04/28/2021   OPHTHALMOLOGY EXAM  04/25/2021   HEMOGLOBIN A1C  08/25/2021   TETANUS/TDAP  01/12/2022    FOOT EXAM  02/25/2022   URINE MICROALBUMIN  02/25/2022   COLONOSCOPY (Pts 45-73yr Insurance coverage will need to be confirmed)  07/03/2025   Pneumonia Vaccine 76 Years old  Completed   INFLUENZA VACCINE  Completed   Hepatitis C Screening  Completed   HPV VACCINES  Aged Out    Health Maintenance  Health Maintenance Due  Topic Date Due   COVID-19 Vaccine (5 - Booster for [place]) 01/13/2021   Zoster Vaccines- Shingrix (2 of 2) 04/28/2021   OPHTHALMOLOGY EXAM  04/25/2021    Colorectal cancer screening: Type of screening: Colonoscopy. Completed 07/04/2015. Repeat every 10 years  Lung Cancer Screening: (Low Dose CT Chest recommended if Age 76-80years, 30 pack-year currently smoking OR have quit w/in 15years.) does not qualify.   Lung Cancer Screening Referral: no  Additional Screening:  Hepatitis C Screening: does qualify; Completed yes  Vision Screening: Recommended annual ophthalmology exams for early detection of glaucoma and other disorders of the eye. Is the patient up to date with their annual eye exam?  Yes  Who is the provider or what is the name of the office in which the patient attends annual eye exams? JBing QuarryScott, OD. If pt is not established with a provider, would they like to be referred to a provider to establish care? No .   Dental Screening: Recommended annual dental exams for proper oral hygiene  Community Resource Referral / Chronic Care Management: CRR required this visit?  No   CCM required this visit?  No      Plan:     I have personally reviewed and noted the following in  the patients chart:   Medical and social history Use of alcohol, tobacco or illicit drugs  Current medications and supplements including opioid prescriptions. Patient is not currently taking opioid prescriptions. Functional ability and status Nutritional status Physical activity Advanced directives List of other physicians Hospitalizations, surgeries, and ER visits  in previous 12 months Vitals Screenings to include cognitive, depression, and falls Referrals and appointments  In addition, I have reviewed and discussed with patient certain preventive protocols, quality metrics, and best practice recommendations. A written personalized care plan for preventive services as well as general preventive health recommendations were provided to patient.     Sheral Flow, LPN   2/0/8138   Nurse Notes:  Patient is cogitatively intact. There were no vitals filed for this visit. There is no height or weight on file to calculate BMI. Patient stated that he has no issues with gait or balance; does not use any assistive devices.

## 2021-05-05 ENCOUNTER — Other Ambulatory Visit: Payer: Self-pay | Admitting: Internal Medicine

## 2021-05-05 DIAGNOSIS — E118 Type 2 diabetes mellitus with unspecified complications: Secondary | ICD-10-CM

## 2021-05-05 DIAGNOSIS — E785 Hyperlipidemia, unspecified: Secondary | ICD-10-CM

## 2021-05-07 LAB — HM DIABETES EYE EXAM

## 2021-07-07 DIAGNOSIS — L821 Other seborrheic keratosis: Secondary | ICD-10-CM | POA: Diagnosis not present

## 2021-07-07 DIAGNOSIS — Z08 Encounter for follow-up examination after completed treatment for malignant neoplasm: Secondary | ICD-10-CM | POA: Diagnosis not present

## 2021-07-07 DIAGNOSIS — D225 Melanocytic nevi of trunk: Secondary | ICD-10-CM | POA: Diagnosis not present

## 2021-07-07 DIAGNOSIS — L718 Other rosacea: Secondary | ICD-10-CM | POA: Diagnosis not present

## 2021-07-07 DIAGNOSIS — L218 Other seborrheic dermatitis: Secondary | ICD-10-CM | POA: Diagnosis not present

## 2021-07-07 DIAGNOSIS — D485 Neoplasm of uncertain behavior of skin: Secondary | ICD-10-CM | POA: Diagnosis not present

## 2021-07-07 DIAGNOSIS — Z85828 Personal history of other malignant neoplasm of skin: Secondary | ICD-10-CM | POA: Diagnosis not present

## 2021-07-07 DIAGNOSIS — L570 Actinic keratosis: Secondary | ICD-10-CM | POA: Diagnosis not present

## 2021-07-07 DIAGNOSIS — L814 Other melanin hyperpigmentation: Secondary | ICD-10-CM | POA: Diagnosis not present

## 2021-08-21 ENCOUNTER — Other Ambulatory Visit: Payer: Self-pay | Admitting: Internal Medicine

## 2021-08-21 DIAGNOSIS — E118 Type 2 diabetes mellitus with unspecified complications: Secondary | ICD-10-CM

## 2021-08-25 ENCOUNTER — Ambulatory Visit (INDEPENDENT_AMBULATORY_CARE_PROVIDER_SITE_OTHER): Payer: Medicare Other | Admitting: Internal Medicine

## 2021-08-25 ENCOUNTER — Encounter: Payer: Self-pay | Admitting: Internal Medicine

## 2021-08-25 VITALS — BP 126/82 | HR 83 | Temp 98.2°F | Ht 69.0 in | Wt 177.0 lb

## 2021-08-25 DIAGNOSIS — E118 Type 2 diabetes mellitus with unspecified complications: Secondary | ICD-10-CM

## 2021-08-25 DIAGNOSIS — I10 Essential (primary) hypertension: Secondary | ICD-10-CM

## 2021-08-25 DIAGNOSIS — E785 Hyperlipidemia, unspecified: Secondary | ICD-10-CM | POA: Diagnosis not present

## 2021-08-25 LAB — BASIC METABOLIC PANEL
BUN: 17 mg/dL (ref 6–23)
CO2: 28 mEq/L (ref 19–32)
Calcium: 9.7 mg/dL (ref 8.4–10.5)
Chloride: 104 mEq/L (ref 96–112)
Creatinine, Ser: 0.93 mg/dL (ref 0.40–1.50)
GFR: 80.02 mL/min (ref >60.00)
Glucose, Bld: 139 mg/dL — ABNORMAL HIGH (ref 70–99)
Potassium: 3.9 mEq/L (ref 3.5–5.1)
Sodium: 140 mEq/L (ref 135–145)

## 2021-08-25 LAB — HEMOGLOBIN A1C: Hgb A1c MFr Bld: 6.5 % (ref 4.6–6.5)

## 2021-08-25 MED ORDER — METFORMIN HCL ER 750 MG PO TB24: 1500.0000 mg | ORAL_TABLET | Freq: Every day | ORAL | 1 refills | Status: DC

## 2021-08-25 MED ORDER — ATORVASTATIN CALCIUM 40 MG PO TABS: 40.0000 mg | ORAL_TABLET | Freq: Every day | ORAL | 1 refills | Status: DC

## 2021-08-25 NOTE — Patient Instructions (Signed)

## 2021-08-25 NOTE — Progress Notes (Unsigned)
Subjective:  Patient ID: Taylor Torres, male    DOB: 05-16-1945  Age: 76 y.o. MRN: [id number]  CC: No chief complaint on file.   HPI Taylor Torres presents for ***  Outpatient Medications Prior to Visit  Medication Sig Dispense Refill   atorvastatin (LIPITOR) 40 MG tablet TAKE 1 TABLET BY MOUTH  DAILY 90 tablet 1   B Complex-Biotin-FA (B-COMPLEX PO) Take 1 tablet by mouth daily.     Blood Glucose Monitoring Suppl (ACCU-CHEK GUIDE ME) w/Device KIT 1 Act by Does not apply route 2 (two) times a day. 2 kit 0   Cholecalciferol (VITAMIN D3) 1000 units CAPS Take 2,000 Units by mouth.     CINNAMON PO Take 1 capsule by mouth daily.     Coenzyme Q10 (CO Q-10) 120 MG CAPS Take 1 capsule by mouth daily.      Flax Oil-Fish Oil-Borage Oil (FISH OIL-FLAX OIL-BORAGE OIL PO) Take 1 each by mouth daily.     hydrocortisone 2.5 % cream Apply topically 2 (two) times daily as needed. (Patient not taking: Reported on 02/25/2021)     MAGNESIUM-ZINC PO Take 1 tablet by mouth daily.     metFORMIN (GLUCOPHAGE-XR) 750 MG 24 hr tablet TAKE 2 TABLETS BY MOUTH DAILY  WITH BREAKFAST 180 tablet 0   metroNIDAZOLE (METROCREAM) 0.75 % cream Apply topically 2 (two) times daily. (Patient not taking: Reported on 02/25/2021)     Misc Natural Products (BLACK CHERRY CONCENTRATE PO) Take 1 each by mouth daily.      Misc Natural Products (GLUCOSAMINE CHOND COMPLEX/MSM PO) Take 1 each by mouth daily.      Multiple Vitamin (MULTI-VITAMIN DAILY PO) Take 1 each by mouth daily.      Turmeric 500 MG CAPS Take by mouth.     No facility-administered medications prior to visit.    ROS Review of Systems  Objective:  There were no vitals taken for this visit.  BP Readings from Last 3 Encounters:  02/25/21 126/76  08/08/20 132/76  02/08/20 122/72    Wt Readings from Last 3 Encounters:  02/25/21 181 lb 3.2 oz (82.2 kg)  08/08/20 187 lb (84.8 kg)  02/08/20 188 lb (85.3 kg)    Physical Exam  Lab Results  Component Value Date    WBC 6.2 02/25/2021   HGB 13.8 02/25/2021   HCT 41.3 02/25/2021   PLT 202.0 02/25/2021   GLUCOSE 131 (H) 02/25/2021   CHOL 121 02/25/2021   TRIG 128.0 02/25/2021   HDL 34.30 (L) 02/25/2021   LDLDIRECT 87.0 02/03/2016   LDLCALC 61 02/25/2021   ALT 14 02/25/2021   AST 13 02/25/2021   NA 138 02/25/2021   K 4.5 02/25/2021   CL 100 02/25/2021   CREATININE 0.87 02/25/2021   BUN 18 02/25/2021   CO2 29 02/25/2021   TSH 1.11 02/25/2021   PSA 0.62 02/25/2021   HGBA1C 7.3 (H) 02/25/2021   MICROALBUR 1.9 02/25/2021    No results found.  Assessment & Plan:   There are no diagnoses linked to this encounter. I am having Taylor Aumiller. Console maintain his B Complex-Biotin-FA (B-COMPLEX PO), MAGNESIUM-ZINC PO, Vitamin D3, CINNAMON PO, Co Q-10, Misc Natural Products (BLACK CHERRY CONCENTRATE PO), Misc Natural Products (GLUCOSAMINE CHOND COMPLEX/MSM PO), Multiple Vitamin (MULTI-VITAMIN DAILY PO), Flax Oil-Fish Oil-Borage Oil (FISH OIL-FLAX OIL-BORAGE OIL PO), Accu-Chek Guide Me, Turmeric, metroNIDAZOLE, hydrocortisone, atorvastatin, and metFORMIN.  No orders of the defined types were placed in this encounter.    Follow-up: No follow-ups on  file.  Scarlette Calico, MD

## 2021-11-06 ENCOUNTER — Other Ambulatory Visit: Payer: Self-pay | Admitting: Internal Medicine

## 2021-11-06 DIAGNOSIS — E118 Type 2 diabetes mellitus with unspecified complications: Secondary | ICD-10-CM

## 2021-11-06 DIAGNOSIS — E785 Hyperlipidemia, unspecified: Secondary | ICD-10-CM

## 2022-01-08 DIAGNOSIS — L570 Actinic keratosis: Secondary | ICD-10-CM | POA: Diagnosis not present

## 2022-01-08 DIAGNOSIS — L578 Other skin changes due to chronic exposure to nonionizing radiation: Secondary | ICD-10-CM | POA: Diagnosis not present

## 2022-02-26 ENCOUNTER — Encounter: Payer: Self-pay | Admitting: Internal Medicine

## 2022-02-26 ENCOUNTER — Ambulatory Visit (INDEPENDENT_AMBULATORY_CARE_PROVIDER_SITE_OTHER): Payer: Medicare Other | Admitting: Internal Medicine

## 2022-02-26 VITALS — BP 128/78 | HR 75 | Temp 98.0°F | Resp 16 | Ht 69.0 in | Wt 175.0 lb

## 2022-02-26 DIAGNOSIS — E118 Type 2 diabetes mellitus with unspecified complications: Secondary | ICD-10-CM

## 2022-02-26 DIAGNOSIS — I10 Essential (primary) hypertension: Secondary | ICD-10-CM

## 2022-02-26 DIAGNOSIS — E785 Hyperlipidemia, unspecified: Secondary | ICD-10-CM | POA: Diagnosis not present

## 2022-02-26 DIAGNOSIS — N3281 Overactive bladder: Secondary | ICD-10-CM | POA: Diagnosis not present

## 2022-02-26 DIAGNOSIS — N182 Chronic kidney disease, stage 2 (mild): Secondary | ICD-10-CM

## 2022-02-26 DIAGNOSIS — Z0001 Encounter for general adult medical examination with abnormal findings: Secondary | ICD-10-CM

## 2022-02-26 DIAGNOSIS — N400 Enlarged prostate without lower urinary tract symptoms: Secondary | ICD-10-CM

## 2022-02-26 DIAGNOSIS — E781 Pure hyperglyceridemia: Secondary | ICD-10-CM | POA: Diagnosis not present

## 2022-02-26 DIAGNOSIS — Z0000 Encounter for general adult medical examination without abnormal findings: Secondary | ICD-10-CM

## 2022-02-26 LAB — LIPID PANEL
Cholesterol: 96 mg/dL (ref 0–200)
HDL: 34.4 mg/dL — ABNORMAL LOW (ref >39.00)
LDL Cholesterol: 41 mg/dL (ref 0–99)
NonHDL: 61.32
Total CHOL/HDL Ratio: 3
Triglycerides: 100 mg/dL (ref 0.0–149.0)
VLDL: 20 mg/dL (ref 0.0–40.0)

## 2022-02-26 LAB — CBC WITH DIFFERENTIAL/PLATELET
Basophils Absolute: 0 10*3/uL (ref 0.0–0.1)
Basophils Relative: 0.6 % (ref 0.0–3.0)
Eosinophils Absolute: 0.3 10*3/uL (ref 0.0–0.7)
Eosinophils Relative: 4.5 % (ref 0.0–5.0)
HCT: 41.4 % (ref 39.0–52.0)
Hemoglobin: 13.8 g/dL (ref 13.0–17.0)
Lymphocytes Relative: 25.3 % (ref 12.0–46.0)
Lymphs Abs: 1.5 10*3/uL (ref 0.7–4.0)
MCHC: 33.4 g/dL (ref 30.0–36.0)
MCV: 92.1 fl (ref 78.0–100.0)
Monocytes Absolute: 0.5 10*3/uL (ref 0.1–1.0)
Monocytes Relative: 8.6 % (ref 3.0–12.0)
Neutro Abs: 3.6 10*3/uL (ref 1.4–7.7)
Neutrophils Relative %: 61 % (ref 43.0–77.0)
Platelets: 211 10*3/uL (ref 150.0–400.0)
RBC: 4.49 Mil/uL (ref 4.22–5.81)
RDW: 12.3 % (ref 11.5–15.5)
WBC: 5.9 10*3/uL (ref 4.0–10.5)

## 2022-02-26 LAB — URINALYSIS, ROUTINE W REFLEX MICROSCOPIC
Bilirubin Urine: NEGATIVE
Hgb urine dipstick: NEGATIVE
Ketones, ur: NEGATIVE
Nitrite: NEGATIVE
RBC / HPF: NONE SEEN (ref 0–?)
Specific Gravity, Urine: 1.025 (ref 1.000–1.030)
Total Protein, Urine: NEGATIVE
Urine Glucose: NEGATIVE
Urobilinogen, UA: 0.2 (ref 0.0–1.0)
pH: 6 (ref 5.0–8.0)

## 2022-02-26 LAB — HEPATIC FUNCTION PANEL
ALT: 13 U/L (ref 0–53)
AST: 14 U/L (ref 0–37)
Albumin: 4.3 g/dL (ref 3.5–5.2)
Alkaline Phosphatase: 60 U/L (ref 39–117)
Bilirubin, Direct: 0.2 mg/dL (ref 0.0–0.3)
Total Bilirubin: 0.8 mg/dL (ref 0.2–1.2)
Total Protein: 6.8 g/dL (ref 6.0–8.3)

## 2022-02-26 LAB — MICROALBUMIN / CREATININE URINE RATIO
Creatinine,U: 164.1 mg/dL
Microalb Creat Ratio: 0.8 mg/g (ref 0.0–30.0)
Microalb, Ur: 1.3 mg/dL (ref 0.0–1.9)

## 2022-02-26 LAB — BASIC METABOLIC PANEL
BUN: 16 mg/dL (ref 6–23)
CO2: 31 mEq/L (ref 19–32)
Calcium: 9.4 mg/dL (ref 8.4–10.5)
Chloride: 101 mEq/L (ref 96–112)
Creatinine, Ser: 0.98 mg/dL (ref 0.40–1.50)
GFR: 74.88 mL/min (ref >60.00)
Glucose, Bld: 134 mg/dL — ABNORMAL HIGH (ref 70–99)
Potassium: 4.1 mEq/L (ref 3.5–5.1)
Sodium: 138 mEq/L (ref 135–145)

## 2022-02-26 LAB — PSA: PSA: 0.62 ng/mL (ref 0.10–4.00)

## 2022-02-26 LAB — HEMOGLOBIN A1C: Hgb A1c MFr Bld: 6.8 % — ABNORMAL HIGH (ref 4.6–6.5)

## 2022-02-26 LAB — TSH: TSH: 1.33 u[IU]/mL (ref 0.35–5.50)

## 2022-02-26 MED ORDER — ATORVASTATIN CALCIUM 40 MG PO TABS: 40.0000 mg | ORAL_TABLET | Freq: Every day | ORAL | 1 refills | Status: DC

## 2022-02-26 NOTE — Patient Instructions (Signed)
Health Maintenance, Male Adopting a healthy lifestyle and getting preventive care are important in promoting health and wellness. Ask your health care provider about: The right schedule for you to have regular tests and exams. Things you can do on your own to prevent diseases and keep yourself healthy. What should I know about diet, weight, and exercise? Eat a healthy diet  Eat a diet that includes plenty of vegetables, fruits, low-fat dairy products, and lean protein. Do not eat a lot of foods that are high in solid fats, added sugars, or sodium. Maintain a healthy weight Body mass index (BMI) is a measurement that can be used to identify possible weight problems. It estimates body fat based on height and weight. Your health care provider can help determine your BMI and help you achieve or maintain a healthy weight. Get regular exercise Get regular exercise. This is one of the most important things you can do for your health. Most adults should: Exercise for at least 150 minutes each week. The exercise should increase your heart rate and make you sweat (moderate-intensity exercise). Do strengthening exercises at least twice a week. This is in addition to the moderate-intensity exercise. Spend less time sitting. Even light physical activity can be beneficial. Watch cholesterol and blood lipids Have your blood tested for lipids and cholesterol at 77 years of age, then have this test every 5 years. You may need to have your cholesterol levels checked more often if: Your lipid or cholesterol levels are high. You are older than 77 years of age. You are at high risk for heart disease. What should I know about cancer screening? Many types of cancers can be detected early and may often be prevented. Depending on your health history and family history, you may need to have cancer screening at various ages. This may include screening for: Colorectal cancer. Prostate cancer. Skin cancer. Lung  cancer. What should I know about heart disease, diabetes, and high blood pressure? Blood pressure and heart disease High blood pressure causes heart disease and increases the risk of stroke. This is more likely to develop in people who have high blood pressure readings or are overweight. Talk with your health care provider about your target blood pressure readings. Have your blood pressure checked: Every 3-5 years if you are 18-39 years of age. Every year if you are 40 years old or older. If you are between the ages of 65 and 75 and are a current or former smoker, ask your health care provider if you should have a one-time screening for abdominal aortic aneurysm (AAA). Diabetes Have regular diabetes screenings. This checks your fasting blood sugar level. Have the screening done: Once every three years after age 45 if you are at a normal weight and have a low risk for diabetes. More often and at a younger age if you are overweight or have a high risk for diabetes. What should I know about preventing infection? Hepatitis B If you have a higher risk for hepatitis B, you should be screened for this virus. Talk with your health care provider to find out if you are at risk for hepatitis B infection. Hepatitis C Blood testing is recommended for: Everyone born from 1945 through 1965. Anyone with known risk factors for hepatitis C. Sexually transmitted infections (STIs) You should be screened each year for STIs, including gonorrhea and chlamydia, if: You are sexually active and are younger than 77 years of age. You are older than 77 years of age and your   health care provider tells you that you are at risk for this type of infection. Your sexual activity has changed since you were last screened, and you are at increased risk for chlamydia or gonorrhea. Ask your health care provider if you are at risk. Ask your health care provider about whether you are at high risk for HIV. Your health care provider  may recommend a prescription medicine to help prevent HIV infection. If you choose to take medicine to prevent HIV, you should first get tested for HIV. You should then be tested every 3 months for as long as you are taking the medicine. Follow these instructions at home: Alcohol use Do not drink alcohol if your health care provider tells you not to drink. If you drink alcohol: Limit how much you have to 0-2 drinks a day. Know how much alcohol is in your drink. In the [place] one drink equals one 12 oz bottle of beer (355 mL), one 5 oz glass of wine (148 mL), or one 1 oz glass of hard liquor (44 mL). Lifestyle Do not use any products that contain nicotine or tobacco. These products include cigarettes, chewing tobacco, and vaping devices, such as e-cigarettes. If you need help quitting, ask your health care provider. Do not use street drugs. Do not share needles. Ask your health care provider for help if you need support or information about quitting drugs. General instructions Schedule regular health, dental, and eye exams. Stay current with your vaccines. Tell your health care provider if: You often feel depressed. You have ever been abused or do not feel safe at home. Summary Adopting a healthy lifestyle and getting preventive care are important in promoting health and wellness. Follow your health care provider's instructions about healthy diet, exercising, and getting tested or screened for diseases. Follow your health care provider's instructions on monitoring your cholesterol and blood pressure. This information is not intended to replace advice given to you by your health care provider. Make sure you discuss any questions you have with your health care provider. Document Revised: 07/01/2020 Document Reviewed: 07/01/2020 Elsevier Patient Education  2023 [employer]  

## 2022-02-26 NOTE — Progress Notes (Signed)
Subjective:  Patient ID: Taylor Torres, male    DOB: 02/09/1946  Age: 77 y.o. MRN: [id number]  CC: Annual Exam, Hypertension, Diabetes, and Hyperlipidemia   HPI Taylor Torres presents for a CPX and f/up -   He walks 1.5 miles a day.  His endurance is good.  He denies chest pain, shortness of breath, diaphoresis, or edema.  Outpatient Medications Prior to Visit  Medication Sig Dispense Refill   B Complex-Biotin-FA (B-COMPLEX PO) Take 1 tablet by mouth daily.     Blood Glucose Monitoring Suppl (ACCU-CHEK GUIDE ME) w/Device KIT 1 Act by Does not apply route 2 (two) times a day. 2 kit 0   Cholecalciferol (VITAMIN D3) 1000 units CAPS Take 2,000 Units by mouth.     CINNAMON PO Take 1 capsule by mouth daily.     Coenzyme Q10 (CO Q-10) 120 MG CAPS Take 1 capsule by mouth daily.      Flax Oil-Fish Oil-Borage Oil (FISH OIL-FLAX OIL-BORAGE OIL PO) Take 1 each by mouth daily.     MAGNESIUM-ZINC PO Take 1 tablet by mouth daily.     metFORMIN (GLUCOPHAGE-XR) 750 MG 24 hr tablet TAKE 2 TABLETS BY MOUTH DAILY  WITH BREAKFAST 200 tablet 1   Misc Natural Products (BLACK CHERRY CONCENTRATE PO) Take 1 each by mouth daily.      Misc Natural Products (GLUCOSAMINE CHOND COMPLEX/MSM PO) Take 1 each by mouth daily.      Multiple Vitamin (MULTI-VITAMIN DAILY PO) Take 1 each by mouth daily.      atorvastatin (LIPITOR) 40 MG tablet TAKE 1 TABLET BY MOUTH DAILY 100 tablet 1   hydrocortisone 2.5 % cream Apply topically 2 (two) times daily as needed.     metroNIDAZOLE (METROCREAM) 0.75 % cream Apply topically 2 (two) times daily.     Turmeric 500 MG CAPS Take by mouth.     No facility-administered medications prior to visit.    ROS Review of Systems  Constitutional:  Negative for appetite change, chills, diaphoresis, fatigue and fever.  HENT: Negative.    Eyes: Negative.   Respiratory: Negative.  Negative for cough, chest tightness, shortness of breath and wheezing.   Cardiovascular:  Negative for chest  pain, palpitations and leg swelling.  Gastrointestinal:  Negative for abdominal pain, diarrhea, nausea and vomiting.  Endocrine: Negative.   Genitourinary:  Positive for frequency and urgency. Negative for difficulty urinating, dysuria, hematuria, penile swelling and scrotal swelling.  Musculoskeletal: Negative.  Negative for myalgias.  Skin: Negative.   Neurological:  Negative for dizziness, weakness, light-headedness and headaches.  Hematological:  Negative for adenopathy. Does not bruise/bleed easily.  Psychiatric/Behavioral: Negative.      Objective:  BP 128/78 (BP Location: Left Arm, Patient Position: Sitting, Cuff Size: Large)   Pulse 75   Temp 98 F (36.7 C) (Oral)   Resp 16   Ht _0  (1.753 m)   Wt 175 lb (79.4 kg)   SpO2 96%   BMI 25.84 kg/m   BP Readings from Last 3 Encounters:  02/26/22 128/78  08/25/21 126/82  02/25/21 126/76    Wt Readings from Last 3 Encounters:  02/26/22 175 lb (79.4 kg)  08/25/21 177 lb (80.3 kg)  02/25/21 181 lb 3.2 oz (82.2 kg)    Physical Exam Vitals reviewed.  Constitutional:      Appearance: Normal appearance. He is not ill-appearing.  HENT:     Nose: Nose normal.     Mouth/Throat:     Mouth: Mucous membranes  are moist.  Eyes:     General: No scleral icterus.    Conjunctiva/sclera: Conjunctivae normal.  Cardiovascular:     Rate and Rhythm: Normal rate and regular rhythm.     Heart sounds: Normal heart sounds, S1 normal and S2 normal. No murmur heard.    No gallop.     Comments: EKG- NSR, 70 bpm No LVH, Q waves, or ST/T wave changes Pulmonary:     Effort: Pulmonary effort is normal.     Breath sounds: No stridor. No wheezing, rhonchi or rales.  Abdominal:     General: Abdomen is flat.     Tenderness: There is no abdominal tenderness. There is no guarding or rebound.     Hernia: No hernia is present.  Musculoskeletal:        General: Normal range of motion.     Cervical back: Neck supple.     Right lower leg: No  edema.     Left lower leg: No edema.  Lymphadenopathy:     Cervical: No cervical adenopathy.  Skin:    General: Skin is warm and dry.  Neurological:     General: No focal deficit present.     Mental Status: He is alert.  Psychiatric:        Mood and Affect: Mood normal.        Behavior: Behavior normal.     Lab Results  Component Value Date   WBC 5.9 02/26/2022   HGB 13.8 02/26/2022   HCT 41.4 02/26/2022   PLT 211.0 02/26/2022   GLUCOSE 134 (H) 02/26/2022   CHOL 96 02/26/2022   TRIG 100.0 02/26/2022   HDL 34.40 (L) 02/26/2022   LDLDIRECT 87.0 02/03/2016   LDLCALC 41 02/26/2022   ALT 13 02/26/2022   AST 14 02/26/2022   NA 138 02/26/2022   K 4.1 02/26/2022   CL 101 02/26/2022   CREATININE 0.98 02/26/2022   BUN 16 02/26/2022   CO2 31 02/26/2022   TSH 1.33 02/26/2022   PSA 0.62 02/26/2022   HGBA1C 6.8 (H) 02/26/2022   MICROALBUR 1.3 02/26/2022    No results found.  Assessment & Plan:   Taylor Torres was seen today for annual exam, hypertension, diabetes and hyperlipidemia.  Diagnoses and all orders for this visit:  Essential hypertension- His blood pressure is well-controlled. -     TSH; Future -     CBC with Differential/Platelet; Future -     Basic metabolic panel; Future -     EKG 12-Lead -     Basic metabolic panel -     CBC with Differential/Platelet -     TSH  BPH without obstruction/lower urinary tract symptoms- His PSA is normal. -     PSA; Future -     Urinalysis, Routine w reflex microscopic; Future -     Urinalysis, Routine w reflex microscopic -     PSA  Hyperlipidemia with target LDL less than 130- LDL goal achieved. Doing well on the statin  -     Lipid panel; Future -     TSH; Future -     Hepatic function panel; Future -     atorvastatin (LIPITOR) 40 MG tablet; Take 1 tablet (40 mg total) by mouth daily. -     Hepatic function panel -     TSH -     Lipid panel  Pure hypertriglyceridemia- Trigs are normal now. -     Lipid panel; Future -      Hepatic function  panel; Future -     Hepatic function panel -     Lipid panel  Encounter for general adult medical examination with abnormal findings- Exam completed, labs reviewed, vaccines reviewed and updated, no cancer screenings indicated, patient education was given.  Type 2 diabetes mellitus with complication, without long-term current use of insulin (HCC) -     Microalbumin / creatinine urine ratio; Future -     Hemoglobin A1c; Future -     Basic metabolic panel; Future -     Basic metabolic panel -     Hemoglobin A1c -     Microalbumin / creatinine urine ratio -     dapagliflozin propanediol (FARXIGA) 10 MG TABS tablet; Take 1 tablet (10 mg total) by mouth daily before breakfast. -     HM Diabetes Foot Exam  Chronic renal disease, stage 2, mildly decreased glomerular filtration rate (GFR) between 60-89 mL/min/1.73 square meter- Will add an SGLT2 inhibitor. -     dapagliflozin propanediol (FARXIGA) 10 MG TABS tablet; Take 1 tablet (10 mg total) by mouth daily before breakfast.  OAB (overactive bladder)- He is not ready to treat this.   I have discontinued Taylor Torres. Conigliaro's Turmeric, metroNIDAZOLE, and hydrocortisone. I have also changed his atorvastatin. Additionally, I am having him start on dapagliflozin propanediol. Lastly, I am having him maintain his B Complex-Biotin-FA (B-COMPLEX PO), MAGNESIUM-ZINC PO, Vitamin D3, CINNAMON PO, Co Q-10, Misc Natural Products (BLACK CHERRY CONCENTRATE PO), Misc Natural Products (GLUCOSAMINE CHOND COMPLEX/MSM PO), Multiple Vitamin (MULTI-VITAMIN DAILY PO), Flax Oil-Fish Oil-Borage Oil (FISH OIL-FLAX OIL-BORAGE OIL PO), Accu-Chek Guide Me, and metFORMIN.  Meds ordered this encounter  Medications   atorvastatin (LIPITOR) 40 MG tablet    Sig: Take 1 tablet (40 mg total) by mouth daily.    Dispense:  100 tablet    Refill:  1   dapagliflozin propanediol (FARXIGA) 10 MG TABS tablet    Sig: Take 1 tablet (10 mg total) by mouth daily before  breakfast.    Dispense:  90 tablet    Refill:  1     Follow-up: Return in about 6 months (around 08/27/2022).  Scarlette Calico, MD

## 2022-02-27 ENCOUNTER — Encounter: Payer: Self-pay | Admitting: Internal Medicine

## 2022-02-27 DIAGNOSIS — N3281 Overactive bladder: Secondary | ICD-10-CM | POA: Insufficient documentation

## 2022-02-27 DIAGNOSIS — N182 Chronic kidney disease, stage 2 (mild): Secondary | ICD-10-CM | POA: Insufficient documentation

## 2022-02-27 MED ORDER — DAPAGLIFLOZIN PROPANEDIOL 10 MG PO TABS: 10.0000 mg | ORAL_TABLET | Freq: Every day | ORAL | 1 refills | Status: DC

## 2022-03-26 ENCOUNTER — Encounter: Payer: Self-pay | Admitting: Internal Medicine

## 2022-03-26 ENCOUNTER — Other Ambulatory Visit: Payer: Self-pay | Admitting: Internal Medicine

## 2022-03-26 DIAGNOSIS — N182 Chronic kidney disease, stage 2 (mild): Secondary | ICD-10-CM

## 2022-03-26 DIAGNOSIS — E118 Type 2 diabetes mellitus with unspecified complications: Secondary | ICD-10-CM

## 2022-03-27 MED ORDER — DAPAGLIFLOZIN PROPANEDIOL 10 MG PO TABS: 10.0000 mg | ORAL_TABLET | Freq: Every day | ORAL | 1 refills | Status: DC

## 2022-03-27 NOTE — Telephone Encounter (Signed)
I have spoke to [place], Software engineer at [employer], per [employer] to maximixe his benefits the plan only pays for 100tabs, Which cancelled out his remaining 15 pills. Oley Balm stated for [place] to send new Rx with a quantity of 100 with 1 refill to prevent this issue in the future.   I have updated Rx and sent to [facility].

## 2022-04-08 DIAGNOSIS — L578 Other skin changes due to chronic exposure to nonionizing radiation: Secondary | ICD-10-CM | POA: Diagnosis not present

## 2022-04-08 DIAGNOSIS — L570 Actinic keratosis: Secondary | ICD-10-CM | POA: Diagnosis not present

## 2022-04-30 ENCOUNTER — Ambulatory Visit (INDEPENDENT_AMBULATORY_CARE_PROVIDER_SITE_OTHER): Payer: Medicare Other

## 2022-04-30 ENCOUNTER — Telehealth: Payer: Self-pay

## 2022-04-30 VITALS — Ht 69.0 in | Wt 164.5 lb

## 2022-04-30 DIAGNOSIS — Z0000 Encounter for general adult medical examination without abnormal findings: Secondary | ICD-10-CM

## 2022-04-30 NOTE — Progress Notes (Signed)
I connected with  Taylor Torres on 04/30/22 by a audio enabled telemedicine application and verified that I am speaking with the correct person using two identifiers.  Patient Location: Home  Provider Location: Office/Clinic  I discussed the limitations of evaluation and management by telemedicine. The patient expressed understanding and agreed to proceed.  Patient Medicare AWV questionnaire was completed by the patient on 04/27/2022; I have confirmed that all information answered by patient is correct and no changes since this date.    Subjective:   Taylor Torres is a 77 y.o. male who presents for Medicare Annual/Subsequent preventive examination.  Review of Systems     Cardiac Risk Factors include: advanced age (>55mn, >>36women);diabetes mellitus;dyslipidemia;hypertension;male gender     Objective:    Today's Vitals   04/30/22 1109 04/30/22 1110  Weight: 164 lb 8 oz (74.6 kg)   Height: '5\' 9"'$  (1.753 m)   PainSc: 0-No pain 0-No pain   Body mass index is 24.29 kg/m.     04/30/2022   11:04 AM 04/28/2021    9:26 AM 04/08/2020   10:52 AM 05/04/2017    9:32 AM 07/27/2016    9:08 PM 07/27/2016    4:09 PM 02/10/2016    7:52 AM  Advanced Directives  Does Patient Have a Medical Advance Directive? Yes Yes Yes No No;Yes No Yes  Type of AParamedicof AWhitneyLiving will Living will;Healthcare Power of Attorney Living will;Healthcare Power of [place] D[place] (pink MOST or yellow form);Living will  [place] will  Does patient want to make changes to medical advance directive? No - Patient declined No - Patient declined No - Patient declined  No - Patient declined    Copy of HBaysidein Chart? Yes - validated most recent copy scanned in chart (See row information) Yes - validated most recent copy scanned in chart (See row information) No - copy requested  No - copy requested  Yes   Would patient like information on creating a medical advance directive?     No - Patient declined No - Patient declined   Pre-existing out of facility DNR order (yellow form or pink MOST form)     Physician notified to receive inpatient order      Current Medications (verified) Outpatient Encounter Medications as of 04/30/2022  Medication Sig   atorvastatin (LIPITOR) 40 MG tablet Take 1 tablet (40 mg total) by mouth daily.   B Complex-Biotin-FA (B-COMPLEX PO) Take 1 tablet by mouth daily.   Blood Glucose Monitoring Suppl (ACCU-CHEK GUIDE ME) w/Device KIT 1 Act by Does not apply route 2 (two) times a day.   Cholecalciferol (VITAMIN D3) 1000 units CAPS Take 2,000 Units by mouth.   Coenzyme Q10 (CO Q-10) 120 MG CAPS Take 1 capsule by mouth daily.    dapagliflozin propanediol (FARXIGA) 10 MG TABS tablet Take 1 tablet (10 mg total) by mouth daily before breakfast.   Flax Oil-Fish Oil-Borage Oil (FISH OIL-FLAX OIL-BORAGE OIL PO) Take 1 each by mouth daily.   MAGNESIUM-ZINC PO Take 1 tablet by mouth daily.   metFORMIN (GLUCOPHAGE-XR) 750 MG 24 hr tablet TAKE 2 TABLETS BY MOUTH DAILY  WITH BREAKFAST   Misc Natural Products (BLACK CHERRY CONCENTRATE PO) Take 1 each by mouth daily.    Misc Natural Products (GLUCOSAMINE CHOND COMPLEX/MSM PO) Take 1 each by mouth daily.    Multiple Vitamin (MULTI-VITAMIN DAILY PO) Take 1 each by mouth  daily.    [DISCONTINUED] CINNAMON PO Take 1 capsule by mouth daily.   No facility-administered encounter medications on file as of 04/30/2022.    Allergies (verified) Patient has no known allergies.   History: Past Medical History:  Diagnosis Date   Alcoholism in family    BPH (benign prostatic hypertrophy)    Diabetes mellitus without complication ([place])    Diverticulosis    Family history of diabetes mellitus    History of colonic polyps    Hyperlipidemia    Past Surgical History:  Procedure Laterality Date   COLONOSCOPY W/ POLYPECTOMY     none     Family  History  Problem Relation Age of Onset   Alcohol abuse Other    Diabetes Other    Esophageal cancer Brother        Dead   Esophageal cancer Brother        alive   Colon polyps Father    Heart disease Neg Hx    Hyperlipidemia Neg Hx    Hypertension Neg Hx    Kidney disease Neg Hx    Stroke Neg Hx    Early death Neg Hx    Colon cancer Neg Hx    Gallbladder disease Neg Hx    Social History   Socioeconomic History   Marital status: Married    Spouse name: Not on file   Number of children: 2   Years of education: Not on file   Highest education level: Not on file  Occupational History   Occupation: Retired  Tobacco Use   Smoking status: Never   Smokeless tobacco: Never   Tobacco comments:    Regular exercise - Yes  Vaping Use   Vaping Use: Never used  Substance and Sexual Activity   Alcohol use: Yes    Alcohol/week: 2.0 standard drinks of alcohol    Types: 2 Glasses of wine per week    Comment: 2 in a week    Drug use: No   Sexual activity: Not Currently  Other Topics Concern   Not on file  Social History Narrative   Not on file   Social Determinants of Health   Financial Resource Strain: Low Risk  (04/30/2022)   Overall Financial Resource Strain (CARDIA)    Difficulty of Paying Living Expenses: Not hard at all  Food Insecurity: No Food Insecurity (04/30/2022)   Hunger Vital Sign    Worried About Running Out of Food in the Last Year: Never true    Ran Out of Food in the Last Year: Never true  Transportation Needs: No Transportation Needs (04/30/2022)   PRAPARE - Hydrologist (Medical): No    Lack of Transportation (Non-Medical): No  Physical Activity: Insufficiently Active (04/30/2022)   Exercise Vital Sign    Days of Exercise per Week: 3 days    Minutes of Exercise per Session: 40 min  Stress: No Stress Concern Present (04/30/2022)   [place]    Feeling of Stress : Not  at all  Social Connections: Unknown (04/30/2022)   Social Connection and Isolation Panel [NHANES]    Frequency of Communication with Friends and Family: Once a week    Frequency of Social Gatherings with Friends and Family: Once a week    Attends Religious Services: Not on Diplomatic Services operational officer of Clubs or Organizations: No    Attends Archivist Meetings: Never    Marital Status:  Married    Tobacco Counseling Counseling given: Not Answered Tobacco comments: Regular exercise - Yes   Clinical Intake:  Pre-visit preparation completed: Yes  Pain : No/denies pain Pain Score: 0-No pain     BMI - recorded: 24.29 Nutritional Status: BMI of 19-24  Normal Nutritional Risks: None Diabetes: Yes CBG done?: No Did pt. bring in CBG monitor from home?: No  How often do you need to have someone help you when you read instructions, pamphlets, or other written materials from your doctor or pharmacy?: 1 - Never What is the last grade level you completed in school?: HSG  Nutrition Risk Assessment:  Has the patient had any N/V/D within the last 2 months?  No  Does the patient have any non-healing wounds?  No  Has the patient had any unintentional weight loss or weight gain?  No   Diabetes:  Is the patient diabetic?  Yes  If diabetic, was a CBG obtained today?  No  Did the patient bring in their glucometer from home?  No  How often do you monitor your CBG's? Twice daily.   Financial Strains and Diabetes Management:  Are you having any financial strains with the device, your supplies or your medication? No .  Does the patient want to be seen by Chronic Care Management for management of their diabetes?  No  Would the patient like to be referred to a Nutritionist or for Diabetic Management?  No   Diabetic Exams:  Diabetic Eye Exam: Completed 05/07/2021 Diabetic Foot Exam: Completed 02/27/2022   Interpreter Needed?: No  Information entered by :: Lisette Abu,  LPN.   Activities of Daily Living    04/30/2022   11:13 AM 04/27/2022    2:37 PM  In your present state of health, do you have any difficulty performing the following activities:  Hearing? 0 0  Vision? 0 0  Difficulty concentrating or making decisions? 0 0  Walking or climbing stairs? 0 0  Dressing or bathing? 0 0  Doing errands, shopping? 0 0  Preparing Food and eating ? N N  Using the Toilet? N N  In the past six months, have you accidently leaked urine? N N  Do you have problems with loss of bowel control? N N  Managing your Medications? N N  Managing your Finances? N N  Housekeeping or managing your Housekeeping? N N    Patient Care Team: Janith Lima, MD as PCP - [name], Wille Glaser, OD as Referring Physician (Optometry)  Indicate any recent Medical Services you may have received from other than [facility] providers in the past year (date may be approximate).     Assessment:   This is a routine wellness examination for Taylor Torres.  Hearing/Vision screen Hearing Screening - Comments:: Denies hearing difficulties   Vision Screening - Comments:: Wears rx glasses - up to date with routine eye exams with Macarthur Critchley, OD.   Dietary issues and exercise activities discussed: Current Exercise Habits: Home exercise routine, Type of exercise: walking, Time (Minutes): 40, Frequency (Times/Week): 3, Weekly Exercise (Minutes/Week): 120, Intensity: Moderate, Exercise limited by: None identified   Goals Addressed             This Visit's Progress    Client will verbalize knowledge of diabetes self-management as evidenced by Hgb A1C <7 or as defined by provider.            Depression Screen    04/30/2022   11:11 AM 04/28/2021    9:37 AM  02/25/2021    1:42 PM 04/08/2020   10:53 AM 02/08/2020    3:08 PM 08/11/2018    9:24 AM 02/20/2017    3:26 PM  PHQ 2/9 Scores  PHQ - 2 Score 0 0 0 0 0 0 0  PHQ- 9 Score 0          Fall Risk    04/30/2022   11:13 AM 04/27/2022    2:37 PM 04/28/2021     9:26 AM 02/25/2021    1:42 PM 04/08/2020   10:53 AM  Fall Risk   Falls in the past year? 0 0 0 0 0  Number falls in past yr: 0 0 0 0 0  Injury with Fall? 0 0 0 0 0  Risk for fall due to : No Fall Risks  No Fall Risks  No Fall Risks  Follow up Falls evaluation completed  Falls evaluation completed      FALL RISK PREVENTION PERTAINING TO THE HOME:  Any stairs in or around the home? Yes  If so, are there any without handrails? No  Home free of loose throw rugs in walkways, pet beds, electrical cords, etc? Yes  Adequate lighting in your home to reduce risk of falls? Yes   ASSISTIVE DEVICES UTILIZED TO PREVENT FALLS:  Life alert? No  Use of a cane, walker or w/c? No  Grab bars in the bathroom? Yes  Shower chair or bench in shower? Yes  Elevated toilet seat or a handicapped toilet? No   TIMED UP AND GO:  Was the test performed? No . Telephonic Visit  Cognitive Function:        04/30/2022   11:14 AM  6CIT Screen  What Year? 0 points  What month? 0 points  What time? 0 points  Count back from 20 0 points  Months in reverse 0 points  Repeat phrase 0 points  Total Score 0 points    Immunizations Immunization History  Administered Date(s) Administered   Hep A / Hep B 02/21/2018, 03/21/2018, 08/11/2018   Influenza Whole 12/04/2008, 01/12/2012   Influenza, High Dose Seasonal PF 12/07/2017, 10/21/2018, 11/18/2020   Influenza,inj,Quad PF,6+ Mos 12/12/2016   Influenza-Unspecified 11/30/2013, 11/05/2015, 11/10/2019   Moderna SARS-COV2 Booster Vaccination 06/03/2020   Moderna Sars-Covid-2 Vaccination 07/18/2021   PFIZER(Purple Top)SARS-COV-2 Vaccination 03/30/2019, 04/24/2019, 11/23/2019, 11/18/2020   Pneumococcal Conjugate-13 12/06/2014   Pneumococcal Polysaccharide-23 01/13/2012, 02/07/2019   RSV,unspecified 10/20/2021   Tdap 01/13/2012   Zoster Recombinat (Shingrix) 03/03/2021, 07/18/2021   Zoster, Live 01/12/2011    TDAP status: Up to date  Flu Vaccine status: Up to  date  Pneumococcal vaccine status: Up to date  Covid-19 vaccine status: Completed vaccines  Qualifies for Shingles Vaccine? Yes   Zostavax completed Yes   Shingrix Completed?: Yes  Screening Tests Health Maintenance  Topic Date Due   COVID-19 Vaccine (7 - 2023-24 season) 10/24/2021   DTaP/Tdap/Td (2 - Td or Tdap) 01/12/2022   OPHTHALMOLOGY EXAM  05/08/2022   HEMOGLOBIN A1C  08/27/2022   Diabetic kidney evaluation - eGFR measurement  02/27/2023   Diabetic kidney evaluation - Urine ACR  02/27/2023   FOOT EXAM  02/28/2023   Medicare Annual Wellness (AWV)  04/30/2023   Pneumonia Vaccine 10+ Years old  Completed   INFLUENZA VACCINE  Completed   Hepatitis C Screening  Completed   Zoster Vaccines- Shingrix  Completed   HPV VACCINES  Aged Out   COLONOSCOPY (Pts 45-5yr Insurance coverage will need to be confirmed)  Discontinued  Health Maintenance  Health Maintenance Due  Topic Date Due   COVID-19 Vaccine (7 - 2023-24 season) 10/24/2021   DTaP/Tdap/Td (2 - Td or Tdap) 01/12/2022    Colorectal cancer screening: No longer required.   Lung Cancer Screening: (Low Dose CT Chest recommended if Age 98-80 years, 30 pack-year currently smoking OR have quit w/in 15years.) does not qualify.   Lung Cancer Screening Referral: no  Additional Screening:  Hepatitis C Screening: does qualify; Completed 02/03/2016  Vision Screening: Recommended annual ophthalmology exams for early detection of glaucoma and other disorders of the eye. Is the patient up to date with their annual eye exam?  Yes  Who is the provider or what is the name of the office in which the patient attends annual eye exams? Job B. Scott, OD. If pt is not established with a provider, would they like to be referred to a provider to establish care? No .   Dental Screening: Recommended annual dental exams for proper oral hygiene  Community Resource Referral / Chronic Care Management: CRR required this visit?  No   CCM  required this visit?  No      Plan:     I have personally reviewed and noted the following in the patient's chart:   Medical and social history Use of alcohol, tobacco or illicit drugs  Current medications and supplements including opioid prescriptions. Patient is not currently taking opioid prescriptions. Functional ability and status Nutritional status Physical activity Advanced directives List of other physicians Hospitalizations, surgeries, and ER visits in previous 12 months Vitals Screenings to include cognitive, depression, and falls Referrals and appointments  In addition, I have reviewed and discussed with patient certain preventive protocols, quality metrics, and best practice recommendations. A written personalized care plan for preventive services as well as general preventive health recommendations were provided to patient.     Sheral Flow, LPN   [id number]   Nurse Notes:  Normal cognitive status assessed by direct observation by this Nurse Health Advisor. No abnormalities found.   Patient Medicare AWV questionnaire was completed by the patient on 04/27/2022; I have confirmed that all information answered by patient is correct and no changes since this date.

## 2022-04-30 NOTE — Telephone Encounter (Signed)
AWV-S was completed.  Marlana Mckowen N. Nasha Diss, LPN. [place] Team Direct Dial: [phone]

## 2022-04-30 NOTE — Patient Instructions (Addendum)
[name] , Thank you for taking time to come for your Medicare Wellness Visit. I appreciate your ongoing commitment to your health goals. Please review the following plan we discussed and let me know if I can assist you in the future.   These are the goals we discussed:  Goals      Client will verbalize knowledge of diabetes self-management as evidenced by Hgb A1C <7 or as defined by provider.            This is a list of the screening recommended for you and due dates:  Health Maintenance  Topic Date Due   COVID-19 Vaccine (7 - 2023-24 season) 10/24/2021   DTaP/Tdap/Td vaccine (2 - Td or Tdap) 01/12/2022   Eye exam for diabetics  05/08/2022   Hemoglobin A1C  08/27/2022   Yearly kidney function blood test for diabetes  02/27/2023   Yearly kidney health urinalysis for diabetes  02/27/2023   Complete foot exam   02/28/2023   Medicare Annual Wellness Visit  04/30/2023   Pneumonia Vaccine  Completed   Flu Shot  Completed   Hepatitis C Screening: USPSTF Recommendation to screen - Ages 18-79 yo.  Completed   Zoster (Shingles) Vaccine  Completed   HPV Vaccine  Aged Out   Colon Cancer Screening  Discontinued    Advanced directives: Yes; documents on file.  Conditions/risks identified: Yes  Next appointment: Follow up in one year for your annual wellness visit.  Preventive Care 29 Years and Older, Male  Preventive care refers to lifestyle choices and visits with your health care provider that can promote health and wellness. What does preventive care include? A yearly physical exam. This is also called an annual well check. Dental exams once or twice a year. Routine eye exams. Ask your health care provider how often you should have your eyes checked. Personal lifestyle choices, including: Daily care of your teeth and gums. Regular physical activity. Eating a healthy diet. Avoiding tobacco and drug use. Limiting alcohol use. Practicing safe sex. Taking low doses of aspirin  every day. Taking vitamin and mineral supplements as recommended by your health care provider. What happens during an annual well check? The services and screenings done by your health care provider during your annual well check will depend on your age, overall health, lifestyle risk factors, and family history of disease. Counseling  Your health care provider may ask you questions about your: Alcohol use. Tobacco use. Drug use. Emotional well-being. Home and relationship well-being. Sexual activity. Eating habits. History of falls. Memory and ability to understand (cognition). Work and work Statistician. Screening  You may have the following tests or measurements: Height, weight, and BMI. Blood pressure. Lipid and cholesterol levels. These may be checked every 5 years, or more frequently if you are over 89 years old. Skin check. Lung cancer screening. You may have this screening every year starting at age 75 if you have a 30-pack-year history of smoking and currently smoke or have quit within the past 15 years. Fecal occult blood test (FOBT) of the stool. You may have this test every year starting at age 72. Flexible sigmoidoscopy or colonoscopy. You may have a sigmoidoscopy every 5 years or a colonoscopy every 10 years starting at age 50. Prostate cancer screening. Recommendations will vary depending on your family history and other risks. Hepatitis C blood test. Hepatitis B blood test. Sexually transmitted disease (STD) testing. Diabetes screening. This is done by checking your blood sugar (glucose) after you have  not eaten for a while (fasting). You may have this done every 1-3 years. Abdominal aortic aneurysm (AAA) screening. You may need this if you are a current or former smoker. Osteoporosis. You may be screened starting at age 55 if you are at high risk. Talk with your health care provider about your test results, treatment options, and if necessary, the need for more  tests. Vaccines  Your health care provider may recommend certain vaccines, such as: Influenza vaccine. This is recommended every year. Tetanus, diphtheria, and acellular pertussis (Tdap, Td) vaccine. You may need a Td booster every 10 years. Zoster vaccine. You may need this after age 71. Pneumococcal 13-valent conjugate (PCV13) vaccine. One dose is recommended after age 65. Pneumococcal polysaccharide (PPSV23) vaccine. One dose is recommended after age 15. Talk to your health care provider about which screenings and vaccines you need and how often you need them. This information is not intended to replace advice given to you by your health care provider. Make sure you discuss any questions you have with your health care provider. Document Released: 03/08/2015 Document Revised: 10/30/2015 Document Reviewed: 12/11/2014 Elsevier Interactive Patient Education  2017 [employer] Prevention in the Home Falls can cause injuries. They can happen to people of all ages. There are many things you can do to make your home safe and to help prevent falls. What can I do on the outside of my home? Regularly fix the edges of walkways and driveways and fix any cracks. Remove anything that might make you trip as you walk through a door, such as a raised step or threshold. Trim any bushes or trees on the path to your home. Use bright outdoor lighting. Clear any walking paths of anything that might make someone trip, such as rocks or tools. Regularly check to see if handrails are loose or broken. Make sure that both sides of any steps have handrails. Any raised decks and porches should have guardrails on the edges. Have any leaves, snow, or ice cleared regularly. Use sand or salt on walking paths during winter. Clean up any spills in your garage right away. This includes oil or grease spills. What can I do in the bathroom? Use night lights. Install grab bars by the toilet and in the tub and shower.  Do not use towel bars as grab bars. Use non-skid mats or decals in the tub or shower. If you need to sit down in the shower, use a plastic, non-slip stool. Keep the floor dry. Clean up any water that spills on the floor as soon as it happens. Remove soap buildup in the tub or shower regularly. Attach bath mats securely with double-sided non-slip rug tape. Do not have throw rugs and other things on the floor that can make you trip. What can I do in the bedroom? Use night lights. Make sure that you have a light by your bed that is easy to reach. Do not use any sheets or blankets that are too big for your bed. They should not hang down onto the floor. Have a firm chair that has side arms. You can use this for support while you get dressed. Do not have throw rugs and other things on the floor that can make you trip. What can I do in the kitchen? Clean up any spills right away. Avoid walking on wet floors. Keep items that you use a lot in easy-to-reach places. If you need to reach something above you, use a strong step stool  that has a grab bar. Keep electrical cords out of the way. Do not use floor polish or wax that makes floors slippery. If you must use wax, use non-skid floor wax. Do not have throw rugs and other things on the floor that can make you trip. What can I do with my stairs? Do not leave any items on the stairs. Make sure that there are handrails on both sides of the stairs and use them. Fix handrails that are broken or loose. Make sure that handrails are as long as the stairways. Check any carpeting to make sure that it is firmly attached to the stairs. Fix any carpet that is loose or worn. Avoid having throw rugs at the top or bottom of the stairs. If you do have throw rugs, attach them to the floor with carpet tape. Make sure that you have a light switch at the top of the stairs and the bottom of the stairs. If you do not have them, ask someone to add them for you. What else  can I do to help prevent falls? Wear shoes that: Do not have high heels. Have rubber bottoms. Are comfortable and fit you well. Are closed at the toe. Do not wear sandals. If you use a stepladder: Make sure that it is fully opened. Do not climb a closed stepladder. Make sure that both sides of the stepladder are locked into place. Ask someone to hold it for you, if possible. Clearly mark and make sure that you can see: Any grab bars or handrails. First and last steps. Where the edge of each step is. Use tools that help you move around (mobility aids) if they are needed. These include: Canes. Walkers. Scooters. Crutches. Turn on the lights when you go into a dark area. Replace any light bulbs as soon as they burn out. Set up your furniture so you have a clear path. Avoid moving your furniture around. If any of your floors are uneven, fix them. If there are any pets around you, be aware of where they are. Review your medicines with your doctor. Some medicines can make you feel dizzy. This can increase your chance of falling. Ask your doctor what other things that you can do to help prevent falls. This information is not intended to replace advice given to you by your health care provider. Make sure you discuss any questions you have with your health care provider. Document Released: 12/06/2008 Document Revised: 07/18/2015 Document Reviewed: 03/16/2014 Elsevier Interactive Patient Education  2017 [employer].

## 2022-06-03 DIAGNOSIS — H40053 Ocular hypertension, bilateral: Secondary | ICD-10-CM | POA: Diagnosis not present

## 2022-06-03 LAB — HM DIABETES EYE EXAM

## 2022-07-08 DIAGNOSIS — Z08 Encounter for follow-up examination after completed treatment for malignant neoplasm: Secondary | ICD-10-CM | POA: Diagnosis not present

## 2022-07-08 DIAGNOSIS — L718 Other rosacea: Secondary | ICD-10-CM | POA: Diagnosis not present

## 2022-07-08 DIAGNOSIS — L570 Actinic keratosis: Secondary | ICD-10-CM | POA: Diagnosis not present

## 2022-07-08 DIAGNOSIS — L821 Other seborrheic keratosis: Secondary | ICD-10-CM | POA: Diagnosis not present

## 2022-07-08 DIAGNOSIS — D225 Melanocytic nevi of trunk: Secondary | ICD-10-CM | POA: Diagnosis not present

## 2022-07-08 DIAGNOSIS — L814 Other melanin hyperpigmentation: Secondary | ICD-10-CM | POA: Diagnosis not present

## 2022-07-08 DIAGNOSIS — Z85828 Personal history of other malignant neoplasm of skin: Secondary | ICD-10-CM | POA: Diagnosis not present

## 2022-09-07 ENCOUNTER — Ambulatory Visit (INDEPENDENT_AMBULATORY_CARE_PROVIDER_SITE_OTHER): Payer: Medicare Other | Admitting: Internal Medicine

## 2022-09-07 ENCOUNTER — Encounter: Payer: Self-pay | Admitting: Internal Medicine

## 2022-09-07 VITALS — BP 118/62 | HR 66 | Temp 97.6°F | Ht 69.0 in | Wt 162.0 lb

## 2022-09-07 DIAGNOSIS — I10 Essential (primary) hypertension: Secondary | ICD-10-CM | POA: Diagnosis not present

## 2022-09-07 DIAGNOSIS — N182 Chronic kidney disease, stage 2 (mild): Secondary | ICD-10-CM | POA: Diagnosis not present

## 2022-09-07 DIAGNOSIS — E785 Hyperlipidemia, unspecified: Secondary | ICD-10-CM | POA: Diagnosis not present

## 2022-09-07 DIAGNOSIS — E118 Type 2 diabetes mellitus with unspecified complications: Secondary | ICD-10-CM

## 2022-09-07 DIAGNOSIS — J300 Vasomotor rhinitis: Secondary | ICD-10-CM | POA: Diagnosis not present

## 2022-09-07 LAB — CBC WITH DIFFERENTIAL/PLATELET
Basophils Absolute: 0 10*3/uL (ref 0.0–0.1)
Basophils Relative: 0.4 % (ref 0.0–3.0)
Eosinophils Absolute: 0.2 10*3/uL (ref 0.0–0.7)
Eosinophils Relative: 3.5 % (ref 0.0–5.0)
HCT: 46.5 % (ref 39.0–52.0)
Hemoglobin: 15.4 g/dL (ref 13.0–17.0)
Lymphocytes Relative: 28.2 % (ref 12.0–46.0)
Lymphs Abs: 1.5 10*3/uL (ref 0.7–4.0)
MCHC: 33 g/dL (ref 30.0–36.0)
MCV: 91.3 fl (ref 78.0–100.0)
Monocytes Absolute: 0.4 10*3/uL (ref 0.1–1.0)
Monocytes Relative: 8 % (ref 3.0–12.0)
Neutro Abs: 3.2 10*3/uL (ref 1.4–7.7)
Neutrophils Relative %: 59.9 % (ref 43.0–77.0)
Platelets: 189 10*3/uL (ref 150.0–400.0)
RBC: 5.1 Mil/uL (ref 4.22–5.81)
RDW: 13.6 % (ref 11.5–15.5)
WBC: 5.4 10*3/uL (ref 4.0–10.5)

## 2022-09-07 LAB — BASIC METABOLIC PANEL
BUN: 20 mg/dL (ref 6–23)
CO2: 31 mEq/L (ref 19–32)
Calcium: 10.1 mg/dL (ref 8.4–10.5)
Chloride: 101 mEq/L (ref 96–112)
Creatinine, Ser: 0.99 mg/dL (ref 0.40–1.50)
GFR: 73.7 mL/min (ref >60.00)
Glucose, Bld: 120 mg/dL — ABNORMAL HIGH (ref 70–99)
Potassium: 4 mEq/L (ref 3.5–5.1)
Sodium: 138 mEq/L (ref 135–145)

## 2022-09-07 LAB — HEMOGLOBIN A1C: Hgb A1c MFr Bld: 6.4 % (ref 4.6–6.5)

## 2022-09-07 MED ORDER — IPRATROPIUM BROMIDE 0.06 % NA SOLN
2.0000 | Freq: Three times a day (TID) | NASAL | 1 refills | Status: AC
Start: 2022-09-07 — End: ?

## 2022-09-07 NOTE — Progress Notes (Signed)
Subjective:  Patient ID: Taylor Torres, male    DOB: [birth year]  Age: 77 y.o. MRN: [id number]  CC: Diabetes and Hyperlipidemia   HPI Taylor Torres presents for f/up ----  Discussed the use of AI scribe software for clinical note transcription with the patient, who gave verbal consent to proceed.  History of Present Illness   The patient, with a history of diabetes, has been adhering to a low-carb diet and taking Farxiga after discontinuing metformin. They deny symptoms of hyperglycemia such as excessive thirst or urination and do not monitor their blood glucose at home. They express a strong desire to avoid insulin therapy and are motivated to further reduce carbohydrate intake to improve glycemic control.  The patient has been losing weight, from a previous weight of 215 pounds to a current weight of 160 pounds. Their physical activity mainly consists of walking over a mile with good endurance.  They also report a new symptom of watery mucus discharge from the nose during meals, identified as gustatory rhinitis. This symptom is not consistent and does not include any bloodiness. The patient's son recently completed cancer therapy.       Outpatient Medications Prior to Visit  Medication Sig Dispense Refill   B Complex-Biotin-FA (B-COMPLEX PO) Take 1 tablet by mouth daily.     Blood Glucose Monitoring Suppl (ACCU-CHEK GUIDE ME) w/Device KIT 1 Act by Does not apply route 2 (two) times a day. 2 kit 0   Cholecalciferol (VITAMIN D3) 1000 units CAPS Take 2,000 Units by mouth.     Coenzyme Q10 (CO Q-10) 120 MG CAPS Take 1 capsule by mouth daily.      Flax Oil-Fish Oil-Borage Oil (FISH OIL-FLAX OIL-BORAGE OIL PO) Take 1 each by mouth daily.     MAGNESIUM-ZINC PO Take 1 tablet by mouth daily.     Misc Natural Products (BLACK CHERRY CONCENTRATE PO) Take 1 each by mouth daily.      Misc Natural Products (GLUCOSAMINE CHOND COMPLEX/MSM PO) Take 1 each by mouth daily.      Multiple Vitamin  (MULTI-VITAMIN DAILY PO) Take 1 each by mouth daily.      atorvastatin (LIPITOR) 40 MG tablet Take 1 tablet (40 mg total) by mouth daily. 100 tablet 1   dapagliflozin propanediol (FARXIGA) 10 MG TABS tablet Take 1 tablet (10 mg total) by mouth daily before breakfast. 100 tablet 1   metFORMIN (GLUCOPHAGE-XR) 750 MG 24 hr tablet TAKE 2 TABLETS BY MOUTH DAILY  WITH BREAKFAST 200 tablet 1   No facility-administered medications prior to visit.    ROS Review of Systems  Constitutional:  Negative for chills, diaphoresis, fatigue and unexpected weight change.  HENT:  Positive for postnasal drip and rhinorrhea. Negative for congestion, nosebleeds, sinus pressure, sore throat, tinnitus and trouble swallowing.   Eyes: Negative.   Respiratory:  Negative for cough, chest tightness, shortness of breath and wheezing.   Cardiovascular:  Negative for chest pain, palpitations and leg swelling.  Gastrointestinal:  Negative for abdominal pain, constipation, diarrhea, nausea and vomiting.  Endocrine: Negative.   Genitourinary: Negative.  Negative for difficulty urinating.  Musculoskeletal: Negative.  Negative for arthralgias and myalgias.  Skin: Negative.   Neurological: Negative.  Negative for dizziness and weakness.  Hematological:  Negative for adenopathy. Does not bruise/bleed easily.  Psychiatric/Behavioral: Negative.      Objective:  BP 118/62 (BP Location: Left Arm, Patient Position: Sitting, Cuff Size: Large)   Pulse 66   Temp 97.6 F (36.4 C) (Oral)  Ht 5\' 9"  (1.753 m)   Wt 162 lb (73.5 kg)   SpO2 96%   BMI 23.92 kg/m   BP Readings from Last 3 Encounters:  09/07/22 118/62  02/26/22 128/78  08/25/21 126/82    Wt Readings from Last 3 Encounters:  09/07/22 162 lb (73.5 kg)  04/30/22 164 lb 8 oz (74.6 kg)  02/26/22 175 lb (79.4 kg)    Physical Exam Vitals reviewed.  Constitutional:      Appearance: Normal appearance.  HENT:     Nose: Nose normal.     Mouth/Throat:     Mouth:  Mucous membranes are moist.  Eyes:     General: No scleral icterus.    Conjunctiva/sclera: Conjunctivae normal.  Cardiovascular:     Rate and Rhythm: Normal rate and regular rhythm.     Heart sounds: No murmur heard.    No friction rub. No gallop.  Pulmonary:     Effort: Pulmonary effort is normal.     Breath sounds: No stridor. No wheezing, rhonchi or rales.  Abdominal:     General: Abdomen is flat.     Palpations: There is no mass.     Tenderness: There is no abdominal tenderness. There is no guarding.     Hernia: No hernia is present.  Musculoskeletal:        General: Normal range of motion.     Cervical back: Neck supple.     Right lower leg: No edema.     Left lower leg: No edema.  Lymphadenopathy:     Cervical: No cervical adenopathy.  Skin:    General: Skin is warm and dry.  Neurological:     General: No focal deficit present.     Mental Status: He is alert. Mental status is at baseline.  Psychiatric:        Mood and Affect: Mood normal.        Behavior: Behavior normal.     Lab Results  Component Value Date   WBC 5.4 09/07/2022   HGB 15.4 09/07/2022   HCT 46.5 09/07/2022   PLT 189.0 09/07/2022   GLUCOSE 120 (H) 09/07/2022   CHOL 96 02/26/2022   TRIG 100.0 02/26/2022   HDL 34.40 (L) 02/26/2022   LDLDIRECT 87.0 02/03/2016   LDLCALC 41 02/26/2022   ALT 13 02/26/2022   AST 14 02/26/2022   NA 138 09/07/2022   K 4.0 09/07/2022   CL 101 09/07/2022   CREATININE 0.99 09/07/2022   BUN 20 09/07/2022   CO2 31 09/07/2022   TSH 1.33 02/26/2022   PSA 0.62 02/26/2022   HGBA1C 6.4 09/07/2022   MICROALBUR 1.3 02/26/2022    No results found.  Assessment & Plan:   Chronic renal disease, stage 2, mildly decreased glomerular filtration rate (GFR) between 60-89 mL/min/1.73 square meter- Renal function is stable. -     Basic metabolic panel; Future -     CBC with Differential/Platelet; Future -     Dapagliflozin Propanediol; Take 1 tablet (10 mg total) by mouth  daily before breakfast.  Dispense: 100 tablet; Refill: 1  Essential hypertension - His BP is well controlled. -     Basic metabolic panel; Future -     CBC with Differential/Platelet; Future  Type 2 diabetes mellitus with complication, without long-term current use of insulin (HCC) - His blood sugar is well controlled -     Basic metabolic panel; Future -     CBC with Differential/Platelet; Future -     Hemoglobin  A1c; Future -     Dapagliflozin Propanediol; Take 1 tablet (10 mg total) by mouth daily before breakfast.  Dispense: 100 tablet; Refill: 1  Vasomotor rhinitis -     Ipratropium Bromide; Place 2 sprays into both nostrils 3 (three) times daily.  Dispense: 90 mL; Refill: 1  Hyperlipidemia with target LDL less than 130 -     Atorvastatin Calcium; Take 1 tablet (40 mg total) by mouth daily.  Dispense: 100 tablet; Refill: 1     Follow-up: Return in about 6 months (around 03/10/2023).  Sanda Linger, MD

## 2022-09-07 NOTE — Patient Instructions (Signed)

## 2022-09-08 MED ORDER — DAPAGLIFLOZIN PROPANEDIOL 10 MG PO TABS
10.0000 mg | ORAL_TABLET | Freq: Every day | ORAL | 1 refills | Status: DC
Start: 2022-09-08 — End: 2023-03-12

## 2022-09-08 MED ORDER — ATORVASTATIN CALCIUM 40 MG PO TABS
40.0000 mg | ORAL_TABLET | Freq: Every day | ORAL | 1 refills | Status: DC
Start: 2022-09-08 — End: 2023-03-12

## 2022-11-03 ENCOUNTER — Other Ambulatory Visit: Payer: Self-pay | Admitting: Internal Medicine

## 2022-11-03 DIAGNOSIS — E118 Type 2 diabetes mellitus with unspecified complications: Secondary | ICD-10-CM

## 2023-01-14 DIAGNOSIS — L718 Other rosacea: Secondary | ICD-10-CM | POA: Diagnosis not present

## 2023-01-14 DIAGNOSIS — L218 Other seborrheic dermatitis: Secondary | ICD-10-CM | POA: Diagnosis not present

## 2023-01-14 DIAGNOSIS — L814 Other melanin hyperpigmentation: Secondary | ICD-10-CM | POA: Diagnosis not present

## 2023-01-14 DIAGNOSIS — L821 Other seborrheic keratosis: Secondary | ICD-10-CM | POA: Diagnosis not present

## 2023-01-14 DIAGNOSIS — D225 Melanocytic nevi of trunk: Secondary | ICD-10-CM | POA: Diagnosis not present

## 2023-03-01 ENCOUNTER — Encounter: Payer: Medicare Other | Admitting: Internal Medicine

## 2023-03-09 ENCOUNTER — Encounter: Payer: Medicare Other | Admitting: Internal Medicine

## 2023-03-11 ENCOUNTER — Encounter: Payer: Self-pay | Admitting: Internal Medicine

## 2023-03-11 ENCOUNTER — Ambulatory Visit: Payer: Medicare Other | Admitting: Internal Medicine

## 2023-03-11 VITALS — BP 128/76 | HR 88 | Temp 97.7°F | Resp 16 | Ht 69.0 in | Wt 158.0 lb

## 2023-03-11 DIAGNOSIS — E118 Type 2 diabetes mellitus with unspecified complications: Secondary | ICD-10-CM

## 2023-03-11 DIAGNOSIS — Z136 Encounter for screening for cardiovascular disorders: Secondary | ICD-10-CM | POA: Insufficient documentation

## 2023-03-11 DIAGNOSIS — E785 Hyperlipidemia, unspecified: Secondary | ICD-10-CM | POA: Diagnosis not present

## 2023-03-11 DIAGNOSIS — N182 Chronic kidney disease, stage 2 (mild): Secondary | ICD-10-CM | POA: Diagnosis not present

## 2023-03-11 DIAGNOSIS — Z23 Encounter for immunization: Secondary | ICD-10-CM | POA: Insufficient documentation

## 2023-03-11 DIAGNOSIS — Z7984 Long term (current) use of oral hypoglycemic drugs: Secondary | ICD-10-CM

## 2023-03-11 DIAGNOSIS — E781 Pure hyperglyceridemia: Secondary | ICD-10-CM | POA: Diagnosis not present

## 2023-03-11 DIAGNOSIS — Z0000 Encounter for general adult medical examination without abnormal findings: Secondary | ICD-10-CM

## 2023-03-11 DIAGNOSIS — Z0001 Encounter for general adult medical examination with abnormal findings: Secondary | ICD-10-CM

## 2023-03-11 LAB — MICROALBUMIN / CREATININE URINE RATIO
Creatinine,U: 78.9 mg/dL
Microalb Creat Ratio: 0.9 mg/g (ref 0.0–30.0)
Microalb, Ur: <0.7 mg/dL (ref 0.0–1.9)

## 2023-03-11 LAB — BASIC METABOLIC PANEL
BUN: 23 mg/dL (ref 6–23)
CO2: 31 meq/L (ref 19–32)
Calcium: 9.7 mg/dL (ref 8.4–10.5)
Chloride: 101 meq/L (ref 96–112)
Creatinine, Ser: 1.05 mg/dL (ref 0.40–1.50)
GFR: 68.43 mL/min (ref >60.00)
Glucose, Bld: 108 mg/dL — ABNORMAL HIGH (ref 70–99)
Potassium: 4.1 meq/L (ref 3.5–5.1)
Sodium: 138 meq/L (ref 135–145)

## 2023-03-11 LAB — LIPID PANEL
Cholesterol: 121 mg/dL (ref 0–200)
HDL: 41.1 mg/dL (ref >39.00)
LDL Cholesterol: 63 mg/dL (ref 0–99)
NonHDL: 79.92
Total CHOL/HDL Ratio: 3
Triglycerides: 86 mg/dL (ref 0.0–149.0)
VLDL: 17.2 mg/dL (ref 0.0–40.0)

## 2023-03-11 LAB — URINALYSIS, ROUTINE W REFLEX MICROSCOPIC
Bilirubin Urine: NEGATIVE
Hgb urine dipstick: NEGATIVE
Ketones, ur: NEGATIVE
Leukocytes,Ua: NEGATIVE
Nitrite: NEGATIVE
RBC / HPF: NONE SEEN (ref 0–?)
Specific Gravity, Urine: 1.015 (ref 1.000–1.030)
Total Protein, Urine: NEGATIVE
Urine Glucose: >=1000 — AB
Urobilinogen, UA: 0.2 (ref 0.0–1.0)
pH: 6 (ref 5.0–8.0)

## 2023-03-11 LAB — CBC WITH DIFFERENTIAL/PLATELET
Basophils Absolute: 0 10*3/uL (ref 0.0–0.1)
Basophils Relative: 0.5 % (ref 0.0–3.0)
Eosinophils Absolute: 0.2 10*3/uL (ref 0.0–0.7)
Eosinophils Relative: 4.3 % (ref 0.0–5.0)
HCT: 47.5 % (ref 39.0–52.0)
Hemoglobin: 15.6 g/dL (ref 13.0–17.0)
Lymphocytes Relative: 27.8 % (ref 12.0–46.0)
Lymphs Abs: 1.4 10*3/uL (ref 0.7–4.0)
MCHC: 32.8 g/dL (ref 30.0–36.0)
MCV: 93.6 fL (ref 78.0–100.0)
Monocytes Absolute: 0.4 10*3/uL (ref 0.1–1.0)
Monocytes Relative: 7.1 % (ref 3.0–12.0)
Neutro Abs: 3.1 10*3/uL (ref 1.4–7.7)
Neutrophils Relative %: 60.3 % (ref 43.0–77.0)
Platelets: 224 10*3/uL (ref 150.0–400.0)
RBC: 5.07 Mil/uL (ref 4.22–5.81)
RDW: 13.2 % (ref 11.5–15.5)
WBC: 5.2 10*3/uL (ref 4.0–10.5)

## 2023-03-11 LAB — HEPATIC FUNCTION PANEL
ALT: 14 U/L (ref 0–53)
AST: 18 U/L (ref 0–37)
Albumin: 4.6 g/dL (ref 3.5–5.2)
Alkaline Phosphatase: 66 U/L (ref 39–117)
Bilirubin, Direct: 0.2 mg/dL (ref 0.0–0.3)
Total Bilirubin: 0.7 mg/dL (ref 0.2–1.2)
Total Protein: 7.6 g/dL (ref 6.0–8.3)

## 2023-03-11 LAB — HEMOGLOBIN A1C: Hgb A1c MFr Bld: 6.4 % (ref 4.6–6.5)

## 2023-03-11 LAB — TSH: TSH: 1.07 u[IU]/mL (ref 0.35–5.50)

## 2023-03-11 MED ORDER — BOOSTRIX 5-2.5-18.5 LF-MCG/0.5 IM SUSP: 0.5000 mL | Freq: Once | INTRAMUSCULAR | 0 refills | Status: DC

## 2023-03-11 MED ORDER — BOOSTRIX 5-2.5-18.5 LF-MCG/0.5 IM SUSP: 0.5000 mL | Freq: Once | INTRAMUSCULAR | 0 refills | Status: AC

## 2023-03-11 NOTE — Progress Notes (Signed)
Subjective:  Patient ID: Taylor Torres, male    DOB: 1945-12-31  Age: 78 y.o. MRN: [id number]  CC: Annual Exam, Hyperlipidemia, and Diabetes   HPI Taylor Torres presents for a CPX and f/up ---  Discussed the use of AI scribe software for clinical note transcription with the patient, who gave verbal consent to proceed.  History of Present Illness   The patient, with a history of diabetes, has been self-administering metformin 750mg  daily in addition to prescribed Farxiga, without experiencing any hypoglycemic symptoms. He reports feeling well overall, with no excessive thirst, urination, drastic changes in weight or appetite, or gastrointestinal side effects from the metformin. However, he does not regularly monitor his blood sugar levels.  The patient's last A1c was 6.4%, and he has some degree of kidney disease. He has been vaccinated against flu and pneumonia in the previous fall. He has a family history of bladder cancer, with both his father and younger brother affected, but he has not experienced any symptoms such as hematuria.       Outpatient Medications Prior to Visit  Medication Sig Dispense Refill   B Complex-Biotin-FA (B-COMPLEX PO) Take 1 tablet by mouth daily.     Blood Glucose Monitoring Suppl (ACCU-CHEK GUIDE ME) w/Device KIT 1 Act by Does not apply route 2 (two) times a day. 2 kit 0   Cholecalciferol (VITAMIN D3) 1000 units CAPS Take 2,000 Units by mouth.     Coenzyme Q10 (CO Q-10) 120 MG CAPS Take 1 capsule by mouth daily.      ipratropium (ATROVENT) 0.06 % nasal spray Place 2 sprays into both nostrils 3 (three) times daily. 90 mL 1   MAGNESIUM-ZINC PO Take 1 tablet by mouth daily.     Misc Natural Products (BLACK CHERRY CONCENTRATE PO) Take 1 each by mouth daily.      Misc Natural Products (GLUCOSAMINE CHOND COMPLEX/MSM PO) Take 1 each by mouth daily.      Multiple Vitamin (MULTI-VITAMIN DAILY PO) Take 1 each by mouth daily.      atorvastatin (LIPITOR) 40 MG tablet  Take 1 tablet (40 mg total) by mouth daily. 100 tablet 1   dapagliflozin propanediol (FARXIGA) 10 MG TABS tablet Take 1 tablet (10 mg total) by mouth daily before breakfast. 100 tablet 1   metFORMIN (GLUCOPHAGE-XR) 750 MG 24 hr tablet Take 750 mg by mouth daily.     Flax Oil-Fish Oil-Borage Oil (FISH OIL-FLAX OIL-BORAGE OIL PO) Take 1 each by mouth daily.     No facility-administered medications prior to visit.    ROS Review of Systems  Constitutional: Negative.  Negative for diaphoresis and fatigue.  HENT: Negative.  Negative for trouble swallowing.   Eyes: Negative.  Negative for visual disturbance.  Respiratory:  Negative for cough, chest tightness, shortness of breath and wheezing.   Cardiovascular:  Negative for chest pain, palpitations and leg swelling.  Gastrointestinal:  Negative for abdominal pain, constipation, diarrhea, nausea and vomiting.  Endocrine: Negative.   Genitourinary: Negative.  Negative for difficulty urinating, dysuria and hematuria.  Musculoskeletal: Negative.  Negative for arthralgias, myalgias and neck pain.  Skin: Negative.   Neurological: Negative.  Negative for dizziness, weakness and light-headedness.  Hematological:  Negative for adenopathy. Does not bruise/bleed easily.  Psychiatric/Behavioral: Negative.      Objective:  BP 128/76 (BP Location: Left Arm, Patient Position: Sitting, Cuff Size: Normal)   Pulse 88   Temp 97.7 F (36.5 C) (Oral)   Resp 16   Ht 5'  9" (1.753 m)   Wt 158 lb (71.7 kg)   SpO2 98%   BMI 23.33 kg/m   BP Readings from Last 3 Encounters:  03/11/23 128/76  09/07/22 118/62  02/26/22 128/78    Wt Readings from Last 3 Encounters:  03/11/23 158 lb (71.7 kg)  09/07/22 162 lb (73.5 kg)  04/30/22 164 lb 8 oz (74.6 kg)    Physical Exam Vitals reviewed.  Constitutional:      Appearance: Normal appearance.  HENT:     Nose: Nose normal.     Mouth/Throat:     Mouth: Mucous membranes are moist.  Eyes:     General: No  scleral icterus.    Conjunctiva/sclera: Conjunctivae normal.  Cardiovascular:     Rate and Rhythm: Normal rate and regular rhythm. Occasional Extrasystoles are present.    Pulses: Normal pulses.     Heart sounds: No murmur heard.    No friction rub. No gallop.     Comments: EKG- SR with PAC's, 71 bpm No LVH, Q waves, or ST/T wave changes  Pulmonary:     Effort: Pulmonary effort is normal.     Breath sounds: No stridor. No wheezing, rhonchi or rales.  Abdominal:     General: Abdomen is flat.     Palpations: There is no mass.     Tenderness: There is no abdominal tenderness. There is no guarding.     Hernia: No hernia is present.  Musculoskeletal:     Cervical back: Neck supple.     Right lower leg: No edema.     Left lower leg: No edema.  Skin:    General: Skin is warm and dry.     Coloration: Skin is not pale.  Neurological:     General: No focal deficit present.     Mental Status: He is alert. Mental status is at baseline.  Psychiatric:        Mood and Affect: Mood normal.        Behavior: Behavior normal.     Lab Results  Component Value Date   WBC 5.2 03/11/2023   HGB 15.6 03/11/2023   HCT 47.5 03/11/2023   PLT 224.0 03/11/2023   GLUCOSE 108 (H) 03/11/2023   CHOL 121 03/11/2023   TRIG 86.0 03/11/2023   HDL 41.10 03/11/2023   LDLDIRECT 87.0 02/03/2016   LDLCALC 63 03/11/2023   ALT 14 03/11/2023   AST 18 03/11/2023   NA 138 03/11/2023   K 4.1 03/11/2023   CL 101 03/11/2023   CREATININE 1.05 03/11/2023   BUN 23 03/11/2023   CO2 31 03/11/2023   TSH 1.07 03/11/2023   PSA 0.62 02/26/2022   HGBA1C 6.4 03/11/2023   MICROALBUR <0.7 03/11/2023    No results found.  Assessment & Plan:   Chronic renal disease, stage 2, mildly decreased glomerular filtration rate (GFR) between 60-89 mL/min/1.73 square meter- His renal function has declined and is now stage III.  He is A1c is at 6.4%.  I have asked him to stop taking metformin and to stay on the SGLT2  inhibitor. -     Urinalysis, Routine w reflex microscopic; Future -     Basic metabolic panel; Future -     CBC with Differential/Platelet; Future -     Microalbumin / creatinine urine ratio; Future -     Dapagliflozin Propanediol; Take 1 tablet (10 mg total) by mouth daily before breakfast.  Dispense: 100 tablet; Refill: 1  Pure hypertriglyceridemia -  Lipid panel; Future  Type 2 diabetes mellitus with complication, without long-term current use of insulin (HCC)- His blood sugar is well-controlled. -     Hemoglobin A1c; Future -     Basic metabolic panel; Future -     Microalbumin / creatinine urine ratio; Future -     HM Diabetes Foot Exam -     Dapagliflozin Propanediol; Take 1 tablet (10 mg total) by mouth daily before breakfast.  Dispense: 100 tablet; Refill: 1  Encounter for general adult medical examination with abnormal findings - Exam completed, labs reviewed, vaccines reviewed and updated, no cancer screenings indicated, pt ed material was given.   Hyperlipidemia with target LDL less than 130- LDL goal achieved. Doing well on the statin  -     Lipid panel; Future -     TSH; Future -     Hepatic function panel; Future -     Atorvastatin Calcium; Take 1 tablet (40 mg total) by mouth daily.  Dispense: 100 tablet; Refill: 1  Screening, ischemic heart disease -     EKG 12-Lead  Need for prophylactic vaccination with combined diphtheria-tetanus-pertussis (DTP) vaccine -     Boostrix; Inject 0.5 mLs into the muscle once for 1 dose.  Dispense: 0.5 mL; Refill: 0     Follow-up: Return in about 6 months (around 09/08/2023).  Sanda Linger, MD

## 2023-03-11 NOTE — Patient Instructions (Signed)
Health Maintenance, Male Adopting a healthy lifestyle and getting preventive care are important in promoting health and wellness. Ask your health care provider about: The right schedule for you to have regular tests and exams. Things you can do on your own to prevent diseases and keep yourself healthy. What should I know about diet, weight, and exercise? Eat a healthy diet  Eat a diet that includes plenty of vegetables, fruits, low-fat dairy products, and lean protein. Do not eat a lot of foods that are high in solid fats, added sugars, or sodium. Maintain a healthy weight Body mass index (BMI) is a measurement that can be used to identify possible weight problems. It estimates body fat based on height and weight. Your health care provider can help determine your BMI and help you achieve or maintain a healthy weight. Get regular exercise Get regular exercise. This is one of the most important things you can do for your health. Most adults should: Exercise for at least 150 minutes each week. The exercise should increase your heart rate and make you sweat (moderate-intensity exercise). Do strengthening exercises at least twice a week. This is in addition to the moderate-intensity exercise. Spend less time sitting. Even light physical activity can be beneficial. Watch cholesterol and blood lipids Have your blood tested for lipids and cholesterol at 78 years of age, then have this test every 5 years. You may need to have your cholesterol levels checked more often if: Your lipid or cholesterol levels are high. You are older than 78 years of age. You are at high risk for heart disease. What should I know about cancer screening? Many types of cancers can be detected early and may often be prevented. Depending on your health history and family history, you may need to have cancer screening at various ages. This may include screening for: Colorectal cancer. Prostate cancer. Skin cancer. Lung  cancer. What should I know about heart disease, diabetes, and high blood pressure? Blood pressure and heart disease High blood pressure causes heart disease and increases the risk of stroke. This is more likely to develop in people who have high blood pressure readings or are overweight. Talk with your health care provider about your target blood pressure readings. Have your blood pressure checked: Every 3-5 years if you are 18-39 years of age. Every year if you are 40 years old or older. If you are between the ages of 65 and 75 and are a current or former smoker, ask your health care provider if you should have a one-time screening for abdominal aortic aneurysm (AAA). Diabetes Have regular diabetes screenings. This checks your fasting blood sugar level. Have the screening done: Once every three years after age 45 if you are at a normal weight and have a low risk for diabetes. More often and at a younger age if you are overweight or have a high risk for diabetes. What should I know about preventing infection? Hepatitis B If you have a higher risk for hepatitis B, you should be screened for this virus. Talk with your health care provider to find out if you are at risk for hepatitis B infection. Hepatitis C Blood testing is recommended for: Everyone born from 1945 through 1965. Anyone with known risk factors for hepatitis C. Sexually transmitted infections (STIs) You should be screened each year for STIs, including gonorrhea and chlamydia, if: You are sexually active and are younger than 78 years of age. You are older than 78 years of age and your   health care provider tells you that you are at risk for this type of infection. Your sexual activity has changed since you were last screened, and you are at increased risk for chlamydia or gonorrhea. Ask your health care provider if you are at risk. Ask your health care provider about whether you are at high risk for HIV. Your health care provider  may recommend a prescription medicine to help prevent HIV infection. If you choose to take medicine to prevent HIV, you should first get tested for HIV. You should then be tested every 3 months for as long as you are taking the medicine. Follow these instructions at home: Alcohol use Do not drink alcohol if your health care provider tells you not to drink. If you drink alcohol: Limit how much you have to 0-2 drinks a day. Know how much alcohol is in your drink. In the [place] one drink equals one 12 oz bottle of beer (355 mL), one 5 oz glass of wine (148 mL), or one 1 oz glass of hard liquor (44 mL). Lifestyle Do not use any products that contain nicotine or tobacco. These products include cigarettes, chewing tobacco, and vaping devices, such as e-cigarettes. If you need help quitting, ask your health care provider. Do not use street drugs. Do not share needles. Ask your health care provider for help if you need support or information about quitting drugs. General instructions Schedule regular health, dental, and eye exams. Stay current with your vaccines. Tell your health care provider if: You often feel depressed. You have ever been abused or do not feel safe at home. Summary Adopting a healthy lifestyle and getting preventive care are important in promoting health and wellness. Follow your health care provider's instructions about healthy diet, exercising, and getting tested or screened for diseases. Follow your health care provider's instructions on monitoring your cholesterol and blood pressure. This information is not intended to replace advice given to you by your health care provider. Make sure you discuss any questions you have with your health care provider. Document Revised: 07/01/2020 Document Reviewed: 07/01/2020 Elsevier Patient Education  2024 Elsevier Inc.  

## 2023-03-12 ENCOUNTER — Encounter: Payer: Self-pay | Admitting: Internal Medicine

## 2023-03-12 MED ORDER — ATORVASTATIN CALCIUM 40 MG PO TABS: 40.0000 mg | ORAL_TABLET | Freq: Every day | ORAL | 1 refills | Status: DC

## 2023-03-12 MED ORDER — DAPAGLIFLOZIN PROPANEDIOL 10 MG PO TABS: 10.0000 mg | ORAL_TABLET | Freq: Every day | ORAL | 1 refills | Status: DC

## 2023-05-19 ENCOUNTER — Other Ambulatory Visit: Payer: Self-pay | Admitting: Internal Medicine

## 2023-05-19 DIAGNOSIS — E118 Type 2 diabetes mellitus with unspecified complications: Secondary | ICD-10-CM

## 2023-05-19 DIAGNOSIS — N182 Chronic kidney disease, stage 2 (mild): Secondary | ICD-10-CM

## 2023-05-19 DIAGNOSIS — E785 Hyperlipidemia, unspecified: Secondary | ICD-10-CM

## 2023-05-28 ENCOUNTER — Ambulatory Visit: Payer: Medicare Other

## 2023-05-28 VITALS — BP 128/76 | Ht 69.0 in | Wt 160.0 lb

## 2023-05-28 DIAGNOSIS — Z0000 Encounter for general adult medical examination without abnormal findings: Secondary | ICD-10-CM

## 2023-05-28 DIAGNOSIS — Z2821 Immunization not carried out because of patient refusal: Secondary | ICD-10-CM

## 2023-05-28 NOTE — Patient Instructions (Signed)
 Mr. Valbuena , Thank you for taking time to come for your Medicare Wellness Visit. I appreciate your ongoing commitment to your health goals. Please review the following plan we discussed and let me know if I can assist you in the future.   Referrals/Orders/Follow-Ups/Clinician Recommendations: follow up in 1 year  This is a list of the screening recommended for you and due dates:  Health Maintenance  Topic Date Due   DTaP/Tdap/Td vaccine (2 - Td or Tdap) 01/12/2022   COVID-19 Vaccine (8 - 2024-25 season) 05/02/2023   Eye exam for diabetics  06/03/2023   Hemoglobin A1C  09/08/2023   Flu Shot  09/24/2023   Yearly kidney function blood test for diabetes  03/10/2024   Yearly kidney health urinalysis for diabetes  03/10/2024   Complete foot exam   03/10/2024   Medicare Annual Wellness Visit  05/27/2024   Pneumonia Vaccine  Completed   Hepatitis C Screening  Completed   Zoster (Shingles) Vaccine  Completed   HPV Vaccine  Aged Out   Colon Cancer Screening  Discontinued    Advanced directives: (Declined) Advance directive discussed with you today. Even though you declined this today, please call our office should you change your mind, and we can give you the proper paperwork for you to fill out.  Next Medicare Annual Wellness Visit scheduled for next year: Yes

## 2023-05-28 NOTE — Progress Notes (Signed)
 Because this visit was a virtual/telehealth visit,  certain criteria was not obtained, such a blood pressure, CBG if applicable, and timed get up and go. Any medications not marked as "taking" were not mentioned during the medication reconciliation part of the visit. Any vitals not documented were not able to be obtained due to this being a telehealth visit or patient was unable to self-report a recent blood pressure reading due to a lack of equipment at home via telehealth. Vitals that have been documented are verbally provided by the patient.   Subjective:   Taylor Torres is a 78 y.o. who presents for a Medicare Wellness preventive visit.  Visit Complete: Virtual I connected with  Taylor Torres on 05/28/23 by a audio enabled telemedicine application and verified that I am speaking with the correct person using two identifiers.  Patient Location: Home  Provider Location: Home Office  I discussed the limitations of evaluation and management by telemedicine. The patient expressed understanding and agreed to proceed.  Vital Signs: Because this visit was a virtual/telehealth visit, some criteria may be missing or patient reported. Any vitals not documented were not able to be obtained and vitals that have been documented are patient reported.  VideoDeclined- This patient declined Librarian, academic. Therefore the visit was completed with audio only.  Persons Participating in Visit: Patient.  AWV Questionnaire: No: Patient Medicare AWV questionnaire was not completed prior to this visit.  Cardiac Risk Factors include: advanced age (>45men, >38 women);hypertension;male gender;dyslipidemia     Objective:    Today's Vitals   05/28/23 1059  BP: 128/76  Weight: 160 lb (72.6 kg)  Height: 5\' 9"  (1.753 m)   Body mass index is 23.63 kg/m.     05/28/2023   11:04 AM 04/30/2022   11:04 AM 04/28/2021    9:26 AM 04/08/2020   10:52 AM 05/04/2017    9:32 AM 07/27/2016     9:08 PM 07/27/2016    4:09 PM  Advanced Directives  Does Patient Have a Medical Advance Directive? Yes Yes Yes Yes No No;Yes No  Type of Estate agent of [employer] Power of [place];Living will Living will;Healthcare Power of Attorney Living will;Healthcare Power of [employer] Power of [place];Out of facility DNR (pink MOST or yellow form);Living will   Does patient want to make changes to medical advance directive? No - Patient declined No - Patient declined No - Patient declined No - Patient declined  No - Patient declined   Copy of Healthcare Power of Attorney in Chart? Yes - validated most recent copy scanned in chart (See row information) Yes - validated most recent copy scanned in chart (See row information) Yes - validated most recent copy scanned in chart (See row information) No - copy requested  No - copy requested   Would patient like information on creating a medical advance directive?      No - Patient declined No - Patient declined  Pre-existing out of facility DNR order (yellow form or pink MOST form)      Physician notified to receive inpatient order     Current Medications (verified) Outpatient Encounter Medications as of 05/28/2023  Medication Sig   atorvastatin (LIPITOR) 40 MG tablet TAKE 1 TABLET BY MOUTH DAILY   B Complex-Biotin-FA (B-COMPLEX PO) Take 1 tablet by mouth daily.   Blood Glucose Monitoring Suppl (ACCU-CHEK GUIDE ME) w/Device KIT 1 Act by Does not apply route 2 (two) times a day.   Cholecalciferol (  VITAMIN D3) 1000 units CAPS Take 2,000 Units by mouth.   Coenzyme Q10 (CO Q-10) 120 MG CAPS Take 1 capsule by mouth daily.    FARXIGA 10 MG TABS tablet TAKE 1 TABLET BY MOUTH DAILY  BEFORE BREAKFAST   ipratropium (ATROVENT) 0.06 % nasal spray Place 2 sprays into both nostrils 3 (three) times daily.   MAGNESIUM-ZINC PO Take 1 tablet by mouth daily.   Misc Natural Products (BLACK CHERRY CONCENTRATE PO) Take 1 each by mouth daily.     Misc Natural Products (GLUCOSAMINE CHOND COMPLEX/MSM PO) Take 1 each by mouth daily.    Multiple Vitamin (MULTI-VITAMIN DAILY PO) Take 1 each by mouth daily.    No facility-administered encounter medications on file as of 05/28/2023.    Allergies (verified) Patient has no known allergies.   History: Past Medical History:  Diagnosis Date   Alcoholism in family    BPH (benign prostatic hypertrophy)    Diabetes mellitus without complication (HCC)    Diverticulosis    Family history of diabetes mellitus    History of colonic polyps    Hyperlipidemia    Past Surgical History:  Procedure Laterality Date   COLONOSCOPY W/ POLYPECTOMY     none     Family History  Problem Relation Age of Onset   Alcohol abuse Other    Diabetes Other    Esophageal cancer Brother        Dead   Esophageal cancer Brother        alive   Colon polyps Father    Heart disease Neg Hx    Hyperlipidemia Neg Hx    Hypertension Neg Hx    Kidney disease Neg Hx    Stroke Neg Hx    Early death Neg Hx    Colon cancer Neg Hx    Gallbladder disease Neg Hx    Social History   Socioeconomic History   Marital status: Married    Spouse name: Not on file   Number of children: 2   Years of education: Not on file   Highest education level: Master's degree (e.g., MA, MS, MEng, MEd, MSW, MBA)  Occupational History   Occupation: Retired  Tobacco Use   Smoking status: Never   Smokeless tobacco: Never   Tobacco comments:    Regular exercise - Yes  Vaping Use   Vaping status: Never Used  Substance and Sexual Activity   Alcohol use: Yes    Alcohol/week: 2.0 standard drinks of alcohol    Types: 2 Glasses of wine per week    Comment: 2 in a week    Drug use: No   Sexual activity: Not Currently  Other Topics Concern   Not on file  Social History Narrative   Not on file   Social Drivers of Health   Financial Resource Strain: Low Risk  (05/28/2023)   Overall Financial Resource Strain (CARDIA)    Difficulty  of Paying Living Expenses: Not hard at all  Food Insecurity: No Food Insecurity (05/28/2023)   Hunger Vital Sign    Worried About Running Out of Food in the Last Year: Never true    Ran Out of Food in the Last Year: Never true  Transportation Needs: No Transportation Needs (05/28/2023)   PRAPARE - Administrator, Civil Service (Medical): No    Lack of Transportation (Non-Medical): No  Physical Activity: Sufficiently Active (05/28/2023)   Exercise Vital Sign    Days of Exercise per Week: 4 days  Minutes of Exercise per Session: 50 min  Stress: No Stress Concern Present (05/28/2023)   [employer] of Occupational Health - Occupational Stress Questionnaire    Feeling of Stress : Not at all  Social Connections: Moderately Isolated (05/28/2023)   Social Connection and Isolation Panel [NHANES]    Frequency of Communication with Friends and Family: More than three times a week    Frequency of Social Gatherings with Friends and Family: Three times a week    Attends Religious Services: Never    Active Member of Clubs or Organizations: No    Attends Banker Meetings: Never    Marital Status: Married    Tobacco Counseling Counseling given: Not Answered Tobacco comments: Regular exercise - Yes    Clinical Intake:  Pre-visit preparation completed: Yes  Pain : No/denies pain     BMI - recorded: 23.63 Nutritional Status: BMI of 19-24  Normal Nutritional Risks: None Diabetes: No  Lab Results  Component Value Date   HGBA1C 6.4 03/11/2023   HGBA1C 6.4 09/07/2022   HGBA1C 6.8 (H) 02/26/2022     How often do you need to have someone help you when you read instructions, pamphlets, or other written materials from your doctor or pharmacy?: 1 - Never What is the last grade level you completed in school?: master     Information entered by :: Reizel Calzada,CMA   Activities of Daily Living     05/25/2023    4:50 PM  In your present state of health, do you  have any difficulty performing the following activities:  Hearing? 0  Vision? 0  Difficulty concentrating or making decisions? 0  Walking or climbing stairs? 0  Dressing or bathing? 0  Doing errands, shopping? 0  Preparing Food and eating ? N  Using the Toilet? N  In the past six months, have you accidently leaked urine? N  Do you have problems with loss of bowel control? N  Managing your Medications? N  Managing your Finances? N  Housekeeping or managing your Housekeeping? N    Patient Care Team: Etta Grandchild, MD as PCP - Solon Augusta, Cletis Athens, OD as Referring Physician (Optometry)  Indicate any recent Medical Services you may have received from other than [facility] providers in the past year (date may be approximate).     Assessment:   This is a routine wellness examination for Taylor Torres.  Hearing/Vision screen Hearing Screening - Comments:: No issues Vision Screening - Comments:: Patient wears  glasses   Goals Addressed             This Visit's Progress    Patient Stated       Patient would like to live to be 89       Depression Screen     05/28/2023   11:05 AM 03/11/2023    8:40 AM 04/30/2022   11:11 AM 04/28/2021    9:37 AM 02/25/2021    1:42 PM 04/08/2020   10:53 AM 02/08/2020    3:08 PM  PHQ 2/9 Scores  PHQ - 2 Score 0 0 0 0 0 0 0  PHQ- 9 Score 0  0        Fall Risk     05/28/2023   11:04 AM 05/25/2023    4:50 PM 03/11/2023    8:40 AM 04/30/2022   11:13 AM 04/27/2022    2:37 PM  Fall Risk   Falls in the past year? 0 0 0 0 0  Number falls in past  yr: 0  0 0 0  Injury with Fall? 0  0 0 0  Risk for fall due to : No Fall Risks  No Fall Risks No Fall Risks   Follow up Falls prevention discussed;Falls evaluation completed  Falls evaluation completed Falls evaluation completed     MEDICARE RISK AT HOME:  Medicare Risk at Home Any stairs in or around the home?: Yes If so, are there any without handrails?: No Home free of loose throw rugs in walkways, pet beds,  electrical cords, etc?: Yes Adequate lighting in your home to reduce risk of falls?: Yes Life alert?: No Use of a cane, walker or w/c?: No Grab bars in the bathroom?: Yes Shower chair or bench in shower?: Yes Elevated toilet seat or a handicapped toilet?: No  TIMED UP AND GO:  Was the test performed?  No  Cognitive Function: 6CIT completed        05/28/2023   11:01 AM 04/30/2022   11:14 AM  6CIT Screen  What Year? 0 points 0 points  What month? 0 points 0 points  What time? 0 points 0 points  Count back from 20 0 points 0 points  Months in reverse 0 points 0 points  Repeat phrase 0 points 0 points  Total Score 0 points 0 points    Immunizations Immunization History  Administered Date(s) Administered   Hep A / Hep B 02/21/2018, 03/21/2018, 08/11/2018   Influenza Whole 12/04/2008, 01/12/2012   Influenza, High Dose Seasonal PF 12/07/2017, 10/21/2018, 11/18/2020   Influenza,inj,Quad PF,6+ Mos 12/12/2016   Influenza-Unspecified 11/30/2013, 11/05/2015, 11/10/2019, 12/07/2021, 11/02/2022   Moderna SARS-COV2 Booster Vaccination 06/03/2020   Moderna Sars-Covid-2 Vaccination 07/18/2021   PFIZER(Purple Top)SARS-COV-2 Vaccination 03/30/2019, 04/24/2019, 11/23/2019, 11/18/2020   Pneumococcal Conjugate-13 12/06/2014   Pneumococcal Polysaccharide-23 01/13/2012, 02/07/2019   RSV,unspecified 10/20/2021   Tdap 01/13/2012   Unspecified SARS-COV-2 Vaccination 11/02/2022   Zoster Recombinant(Shingrix) 03/03/2021, 07/18/2021   Zoster, Live 01/12/2011    Screening Tests Health Maintenance  Topic Date Due   DTaP/Tdap/Td (2 - Td or Tdap) 01/12/2022   COVID-19 Vaccine (8 - 2024-25 season) 05/02/2023   OPHTHALMOLOGY EXAM  06/03/2023   HEMOGLOBIN A1C  09/08/2023   INFLUENZA VACCINE  09/24/2023   Diabetic kidney evaluation - eGFR measurement  03/10/2024   Diabetic kidney evaluation - Urine ACR  03/10/2024   FOOT EXAM  03/10/2024   Medicare Annual Wellness (AWV)  05/27/2024   Pneumonia  Vaccine 11+ Years old  Completed   Hepatitis C Screening  Completed   Zoster Vaccines- Shingrix  Completed   HPV VACCINES  Aged Out   Colonoscopy  Discontinued    Health Maintenance  Health Maintenance Due  Topic Date Due   DTaP/Tdap/Td (2 - Td or Tdap) 01/12/2022   COVID-19 Vaccine (8 - 2024-25 season) 05/02/2023   Health Maintenance Items Addressed:   Additional Screening:  Vision Screening: Recommended annual ophthalmology exams for early detection of glaucoma and other disorders of the eye.  Dental Screening: Recommended annual dental exams for proper oral hygiene  Community Resource Referral / Chronic Care Management: CRR required this visit?  no  CCM required this visit?  No     Plan:     I have personally reviewed and noted the following in the patient's chart:   Medical and social history Use of alcohol, tobacco or illicit drugs  Current medications and supplements including opioid prescriptions. Patient is not currently taking opioid prescriptions. Functional ability and status Nutritional status Physical activity Advanced directives List  of other physicians Hospitalizations, surgeries, and ER visits in previous 12 months Vitals Screenings to include cognitive, depression, and falls Referrals and appointments  In addition, I have reviewed and discussed with patient certain preventive protocols, quality metrics, and best practice recommendations. A written personalized care plan for preventive services as well as general preventive health recommendations were provided to patient.     Rudi Heap, [place]   05/28/2023   After Visit Summary: (MyChart) Due to this being a telephonic visit, the after visit summary with patients personalized plan was offered to patient via MyChart   Notes: Nothing significant to report at this time.

## 2023-06-03 DIAGNOSIS — H524 Presbyopia: Secondary | ICD-10-CM | POA: Diagnosis not present

## 2023-06-03 DIAGNOSIS — H40053 Ocular hypertension, bilateral: Secondary | ICD-10-CM | POA: Diagnosis not present

## 2023-06-03 LAB — HM DIABETES EYE EXAM

## 2023-07-09 DIAGNOSIS — D225 Melanocytic nevi of trunk: Secondary | ICD-10-CM | POA: Diagnosis not present

## 2023-07-09 DIAGNOSIS — L814 Other melanin hyperpigmentation: Secondary | ICD-10-CM | POA: Diagnosis not present

## 2023-07-09 DIAGNOSIS — L821 Other seborrheic keratosis: Secondary | ICD-10-CM | POA: Diagnosis not present

## 2023-07-09 DIAGNOSIS — L570 Actinic keratosis: Secondary | ICD-10-CM | POA: Diagnosis not present

## 2023-09-07 DIAGNOSIS — H25043 Posterior subcapsular polar age-related cataract, bilateral: Secondary | ICD-10-CM | POA: Diagnosis not present

## 2023-09-07 DIAGNOSIS — H2513 Age-related nuclear cataract, bilateral: Secondary | ICD-10-CM | POA: Diagnosis not present

## 2023-09-07 DIAGNOSIS — H2512 Age-related nuclear cataract, left eye: Secondary | ICD-10-CM | POA: Diagnosis not present

## 2023-09-07 DIAGNOSIS — H18413 Arcus senilis, bilateral: Secondary | ICD-10-CM | POA: Diagnosis not present

## 2023-09-07 DIAGNOSIS — H25013 Cortical age-related cataract, bilateral: Secondary | ICD-10-CM | POA: Diagnosis not present

## 2023-09-09 ENCOUNTER — Ambulatory Visit: Payer: Medicare Other | Admitting: Internal Medicine

## 2023-09-22 ENCOUNTER — Encounter: Payer: Self-pay | Admitting: Internal Medicine

## 2023-09-22 ENCOUNTER — Ambulatory Visit (INDEPENDENT_AMBULATORY_CARE_PROVIDER_SITE_OTHER): Admitting: Internal Medicine

## 2023-09-22 ENCOUNTER — Ambulatory Visit: Payer: Self-pay | Admitting: Internal Medicine

## 2023-09-22 VITALS — BP 142/76 | HR 63 | Temp 97.9°F | Resp 16 | Ht 69.0 in | Wt 168.6 lb

## 2023-09-22 DIAGNOSIS — K219 Gastro-esophageal reflux disease without esophagitis: Secondary | ICD-10-CM

## 2023-09-22 DIAGNOSIS — N1831 Chronic kidney disease, stage 3a: Secondary | ICD-10-CM | POA: Insufficient documentation

## 2023-09-22 DIAGNOSIS — E118 Type 2 diabetes mellitus with unspecified complications: Secondary | ICD-10-CM

## 2023-09-22 DIAGNOSIS — I10 Essential (primary) hypertension: Secondary | ICD-10-CM

## 2023-09-22 LAB — URINALYSIS, ROUTINE W REFLEX MICROSCOPIC
Bilirubin Urine: NEGATIVE
Hgb urine dipstick: NEGATIVE
Ketones, ur: NEGATIVE
Leukocytes,Ua: NEGATIVE
Nitrite: NEGATIVE
RBC / HPF: NONE SEEN (ref 0–?)
Specific Gravity, Urine: 1.01 (ref 1.000–1.030)
Total Protein, Urine: NEGATIVE
Urine Glucose: >=1000 — AB
Urobilinogen, UA: 0.2 (ref 0.0–1.0)
pH: 6 (ref 5.0–8.0)

## 2023-09-22 LAB — BASIC METABOLIC PANEL WITH GFR
BUN: 18 mg/dL (ref 6–23)
CO2: 29 meq/L (ref 19–32)
Calcium: 9.6 mg/dL (ref 8.4–10.5)
Chloride: 103 meq/L (ref 96–112)
Creatinine, Ser: 1.08 mg/dL (ref 0.40–1.50)
GFR: 65.91 mL/min (ref >60.00)
Glucose, Bld: 131 mg/dL — ABNORMAL HIGH (ref 70–99)
Potassium: 4.9 meq/L (ref 3.5–5.1)
Sodium: 138 meq/L (ref 135–145)

## 2023-09-22 LAB — CBC WITH DIFFERENTIAL/PLATELET
Basophils Absolute: 0 K/uL (ref 0.0–0.1)
Basophils Relative: 0.6 % (ref 0.0–3.0)
Eosinophils Absolute: 0.2 K/uL (ref 0.0–0.7)
Eosinophils Relative: 3.3 % (ref 0.0–5.0)
HCT: 45.4 % (ref 39.0–52.0)
Hemoglobin: 14.9 g/dL (ref 13.0–17.0)
Lymphocytes Relative: 22.8 % (ref 12.0–46.0)
Lymphs Abs: 1.3 K/uL (ref 0.7–4.0)
MCHC: 32.9 g/dL (ref 30.0–36.0)
MCV: 93.8 fl (ref 78.0–100.0)
Monocytes Absolute: 0.5 K/uL (ref 0.1–1.0)
Monocytes Relative: 8.3 % (ref 3.0–12.0)
Neutro Abs: 3.8 K/uL (ref 1.4–7.7)
Neutrophils Relative %: 65 % (ref 43.0–77.0)
Platelets: 177 K/uL (ref 150.0–400.0)
RBC: 4.84 Mil/uL (ref 4.22–5.81)
RDW: 13.9 % (ref 11.5–15.5)
WBC: 5.8 K/uL (ref 4.0–10.5)

## 2023-09-22 LAB — HEMOGLOBIN A1C: Hgb A1c MFr Bld: 6.7 % — ABNORMAL HIGH (ref 4.6–6.5)

## 2023-09-22 LAB — MICROALBUMIN / CREATININE URINE RATIO
Creatinine,U: 57.4 mg/dL
Microalb Creat Ratio: 14.4 mg/g (ref 0.0–30.0)
Microalb, Ur: 0.8 mg/dL (ref 0.0–1.9)

## 2023-09-22 NOTE — Progress Notes (Signed)
 Subjective:  Patient ID: Taylor Torres, male    DOB: 11/07/1945  Age: 78 y.o. MRN: [id number]  CC: Gastroesophageal Reflux, Hyperlipidemia, and Diabetes   HPI Taylor Torres presents for f/up ----  Discussed the use of AI scribe software for clinical note transcription with the patient, who gave verbal consent to proceed.  History of Present Illness Taylor Torres is a 78 year old male with borderline stage 2-3 kidney disease who presents for a follow-up visit.  He feels good and remains active, having recently traveled to [place]  to see a volcano. No issues with activity or travel, including no chest pain, shortness of breath, dizziness, or lightheadedness.  He is currently taking Farxiga  without experiencing side effects such as pelvic pain, painful urination, frequent urination, dizziness, lightheadedness, or palpitations. He believes the medication is effective without impacting his daily life.  He recalls being taken off metformin  and is interested in understanding the benefits of Farxiga  for his heart and kidneys. He recalls being told he has borderline stage 2-3 kidney disease and asks about the prospects for his condition and whether Farxiga  might help.     Outpatient Medications Prior to Visit  Medication Sig Dispense Refill   atorvastatin  (LIPITOR) 40 MG tablet TAKE 1 TABLET BY MOUTH DAILY 100 tablet 2   B Complex-Biotin-FA (B-COMPLEX PO) Take 1 tablet by mouth daily.     Blood Glucose Monitoring Suppl (ACCU-CHEK GUIDE ME) w/Device KIT 1 Act by Does not apply route 2 (two) times a day. 2 kit 0   Cholecalciferol (VITAMIN D3) 1000 units CAPS Take 2,000 Units by mouth.     Coenzyme Q10 (CO Q-10) 120 MG CAPS Take 1 capsule by mouth daily.      FARXIGA  10 MG TABS tablet TAKE 1 TABLET BY MOUTH DAILY  BEFORE BREAKFAST 100 tablet 2   ipratropium (ATROVENT ) 0.06 % nasal spray Place 2 sprays into both nostrils 3 (three) times daily. 90 mL 1   MAGNESIUM-ZINC PO Take 1 tablet by mouth  daily.     Misc Natural Products (BLACK CHERRY CONCENTRATE PO) Take 1 each by mouth daily.      Misc Natural Products (GLUCOSAMINE CHOND COMPLEX/MSM PO) Take 1 each by mouth daily.      Multiple Vitamin (MULTI-VITAMIN DAILY PO) Take 1 each by mouth daily.      No facility-administered medications prior to visit.    ROS Review of Systems  Constitutional: Negative.  Negative for diaphoresis, fatigue and unexpected weight change.  HENT: Negative.    Eyes: Negative.   Respiratory:  Negative for cough, chest tightness, shortness of breath and wheezing.   Cardiovascular:  Negative for chest pain, palpitations and leg swelling.  Gastrointestinal: Negative.  Negative for abdominal pain, constipation, diarrhea, nausea and vomiting.  Endocrine: Negative.   Genitourinary: Negative.  Negative for difficulty urinating.  Musculoskeletal: Negative.  Negative for arthralgias and myalgias.  Skin: Negative.   Allergic/Immunologic: Negative.   Neurological: Negative.  Negative for dizziness and weakness.  Hematological:  Negative for adenopathy. Does not bruise/bleed easily.  Psychiatric/Behavioral: Negative.      Objective:  BP (!) 142/76 (BP Location: Left Arm, Patient Position: Sitting, Cuff Size: Normal)   Pulse 63   Temp 97.9 F (36.6 C) (Oral)   Resp 16   Ht 5' 9 (1.753 m)   Wt 168 lb 9.6 oz (76.5 kg)   SpO2 99%   BMI 24.90 kg/m   BP Readings from Last 3 Encounters:  09/22/23 (!) 142/76  05/28/23 128/76  03/11/23 128/76    Wt Readings from Last 3 Encounters:  09/22/23 168 lb 9.6 oz (76.5 kg)  05/28/23 160 lb (72.6 kg)  03/11/23 158 lb (71.7 kg)    Physical Exam Vitals reviewed.  Constitutional:      Appearance: He is not ill-appearing.  HENT:     Mouth/Throat:     Mouth: Mucous membranes are moist.  Eyes:     General: No scleral icterus.    Conjunctiva/sclera: Conjunctivae normal.  Cardiovascular:     Rate and Rhythm: Normal rate and regular rhythm.     Heart sounds:  No murmur heard.    No friction rub. No gallop.  Pulmonary:     Effort: Pulmonary effort is normal.     Breath sounds: No stridor. No wheezing, rhonchi or rales.  Abdominal:     General: Abdomen is flat.     Palpations: There is no mass.     Tenderness: There is no abdominal tenderness. There is no guarding.     Hernia: No hernia is present.  Musculoskeletal:        General: Normal range of motion.     Cervical back: Neck supple.     Right lower leg: No edema.     Left lower leg: No edema.  Lymphadenopathy:     Cervical: No cervical adenopathy.  Skin:    General: Skin is warm and dry.  Neurological:     General: No focal deficit present.     Mental Status: He is alert.  Psychiatric:        Mood and Affect: Mood normal.        Behavior: Behavior normal.     Lab Results  Component Value Date   WBC 5.8 09/22/2023   HGB 14.9 09/22/2023   HCT 45.4 09/22/2023   PLT 177.0 09/22/2023   GLUCOSE 131 (H) 09/22/2023   CHOL 121 03/11/2023   TRIG 86.0 03/11/2023   HDL 41.10 03/11/2023   LDLDIRECT 87.0 02/03/2016   LDLCALC 63 03/11/2023   ALT 14 03/11/2023   AST 18 03/11/2023   NA 138 09/22/2023   K 4.9 Hemolysis seen... 09/22/2023   CL 103 09/22/2023   CREATININE 1.08 09/22/2023   BUN 18 09/22/2023   CO2 29 09/22/2023   TSH 1.07 03/11/2023   PSA 0.62 02/26/2022   HGBA1C 6.7 (H) 09/22/2023   MICROALBUR 0.8 09/22/2023    No results found.  Assessment & Plan:  Type 2 diabetes mellitus with complication, without long-term current use of insulin  (HCC)- Blood sugar is well controlled. -     Basic metabolic panel with GFR; Future -     Hemoglobin A1c; Future -     Microalbumin / creatinine urine ratio; Future  Gastroesophageal reflux disease without esophagitis -     CBC with Differential/Platelet; Future  Essential hypertension- BP is adequately well controlled. -     Basic metabolic panel with GFR; Future -     CBC with Differential/Platelet; Future -     Urinalysis,  Routine w reflex microscopic; Future  Stage 3a chronic kidney disease (HCC)- Will continue the SGLT2-inh.     Follow-up: Return in about 6 months (around 03/24/2024).  Debby Molt, MD

## 2023-09-22 NOTE — Patient Instructions (Signed)

## 2023-10-15 DIAGNOSIS — Z09 Encounter for follow-up examination after completed treatment for conditions other than malignant neoplasm: Secondary | ICD-10-CM | POA: Diagnosis not present

## 2023-10-15 DIAGNOSIS — L578 Other skin changes due to chronic exposure to nonionizing radiation: Secondary | ICD-10-CM | POA: Diagnosis not present

## 2023-10-15 DIAGNOSIS — L570 Actinic keratosis: Secondary | ICD-10-CM | POA: Diagnosis not present

## 2023-11-02 ENCOUNTER — Other Ambulatory Visit: Payer: Self-pay | Admitting: Internal Medicine

## 2023-11-02 ENCOUNTER — Encounter: Payer: Self-pay | Admitting: Internal Medicine

## 2023-11-02 MED ORDER — COVID-19 MRNA VAC-TRIS(PFIZER) 30 MCG/0.3ML IM SUSY: 0.3000 mL | PREFILLED_SYRINGE | Freq: Once | INTRAMUSCULAR | 0 refills | Status: AC

## 2023-11-04 ENCOUNTER — Telehealth: Payer: Self-pay | Admitting: Radiology

## 2023-11-04 NOTE — Telephone Encounter (Signed)
 Copied from CRM [id number]. Topic: Clinical - Prescription Issue >> Nov 04, 2023  9:16 AM Macario HERO wrote: Reason for CRM: Patient is calling because he is leaving for vacation on September 15 and sent a request for COVID 19 vaccine through MyChart on 11/02/23 and he has  not heard anything from his pharmacy. Patient is requesting a follow up call back once completed.

## 2023-11-05 ENCOUNTER — Other Ambulatory Visit: Payer: Self-pay

## 2023-11-05 DIAGNOSIS — Z23 Encounter for immunization: Secondary | ICD-10-CM

## 2023-11-05 MED ORDER — COVID-19 MRNA VAC-TRIS(PFIZER) 30 MCG/0.3ML IM SUSY: 0.3000 mL | PREFILLED_SYRINGE | Freq: Once | INTRAMUSCULAR | 0 refills | Status: AC

## 2023-11-05 NOTE — Telephone Encounter (Signed)
 RX has been sent.

## 2024-06-02 ENCOUNTER — Ambulatory Visit
